# Patient Record
Sex: Male | Born: 1935 | Race: White | Hispanic: No | Smoking: Never smoker
Health system: Southern US, Community
[De-identification: ages and names within clinical notes are randomized; demographics above are authoritative.]

## PROBLEM LIST (undated history)

## (undated) DIAGNOSIS — Z8673 Personal history of transient ischemic attack (TIA), and cerebral infarction without residual deficits: Secondary | ICD-10-CM

## (undated) DIAGNOSIS — E785 Hyperlipidemia, unspecified: Secondary | ICD-10-CM

## (undated) DIAGNOSIS — E119 Type 2 diabetes mellitus without complications: Secondary | ICD-10-CM

## (undated) DIAGNOSIS — I10 Essential (primary) hypertension: Secondary | ICD-10-CM

## (undated) DIAGNOSIS — I669 Occlusion and stenosis of unspecified cerebral artery: Secondary | ICD-10-CM

## (undated) DIAGNOSIS — E7800 Pure hypercholesterolemia, unspecified: Secondary | ICD-10-CM

## (undated) DIAGNOSIS — N200 Calculus of kidney: Secondary | ICD-10-CM

## (undated) HISTORY — DX: Pure hypercholesterolemia, unspecified: E78.00

## (undated) HISTORY — DX: Occlusion and stenosis of unspecified cerebral artery: I66.9

## (undated) HISTORY — DX: Essential (primary) hypertension: I10

## (undated) HISTORY — PX: CHOLECYSTECTOMY: SHX55

## (undated) HISTORY — DX: Personal history of transient ischemic attack (TIA), and cerebral infarction without residual deficits: Z86.73

## (undated) HISTORY — DX: Hyperlipidemia, unspecified: E78.5

## (undated) HISTORY — DX: Type 2 diabetes mellitus without complications: E11.9

## (undated) HISTORY — PX: TONSILLECTOMY: SUR1361

## (undated) HISTORY — DX: Calculus of kidney: N20.0

---

## 2008-09-24 ENCOUNTER — Inpatient Hospital Stay: Payer: Self-pay | Admitting: Internal Medicine

## 2011-08-09 DIAGNOSIS — Z7901 Long term (current) use of anticoagulants: Secondary | ICD-10-CM | POA: Insufficient documentation

## 2011-08-09 DIAGNOSIS — I669 Occlusion and stenosis of unspecified cerebral artery: Secondary | ICD-10-CM

## 2011-08-09 HISTORY — DX: Occlusion and stenosis of unspecified cerebral artery: I66.9

## 2014-09-25 DIAGNOSIS — I10 Essential (primary) hypertension: Secondary | ICD-10-CM

## 2014-09-25 DIAGNOSIS — E7800 Pure hypercholesterolemia, unspecified: Secondary | ICD-10-CM

## 2014-09-25 DIAGNOSIS — E119 Type 2 diabetes mellitus without complications: Secondary | ICD-10-CM

## 2014-09-25 HISTORY — DX: Type 2 diabetes mellitus without complications: E11.9

## 2014-09-25 HISTORY — DX: Essential (primary) hypertension: I10

## 2014-09-25 HISTORY — DX: Pure hypercholesterolemia, unspecified: E78.00

## 2014-10-24 ENCOUNTER — Inpatient Hospital Stay: Payer: Self-pay | Admitting: Internal Medicine

## 2014-10-24 LAB — COMPREHENSIVE METABOLIC PANEL
ALBUMIN: 3.7 g/dL (ref 3.4–5.0)
ALK PHOS: 39 U/L — AB
ANION GAP: 8 (ref 7–16)
AST: 30 U/L (ref 15–37)
BUN: 22 mg/dL — ABNORMAL HIGH (ref 7–18)
Bilirubin,Total: 0.8 mg/dL (ref 0.2–1.0)
CHLORIDE: 109 mmol/L — AB (ref 98–107)
CREATININE: 0.85 mg/dL (ref 0.60–1.30)
Calcium, Total: 9.5 mg/dL (ref 8.5–10.1)
Co2: 24 mmol/L (ref 21–32)
EGFR (African American): >60
Glucose: 256 mg/dL — ABNORMAL HIGH (ref 65–99)
OSMOLALITY: 293 (ref 275–301)
Potassium: 4.4 mmol/L (ref 3.5–5.1)
SGPT (ALT): 37 U/L
Sodium: 141 mmol/L (ref 136–145)
TOTAL PROTEIN: 6.7 g/dL (ref 6.4–8.2)

## 2014-10-24 LAB — CBC
HCT: 43.6 % (ref 40.0–52.0)
HGB: 14.9 g/dL (ref 13.0–18.0)
MCH: 33.5 pg (ref 26.0–34.0)
MCHC: 34.2 g/dL (ref 32.0–36.0)
MCV: 98 fL (ref 80–100)
PLATELETS: 191 10*3/uL (ref 150–440)
RBC: 4.46 10*6/uL (ref 4.40–5.90)
RDW: 13.6 % (ref 11.5–14.5)
WBC: 13.2 10*3/uL — AB (ref 3.8–10.6)

## 2014-10-24 LAB — APTT: ACTIVATED PTT: 30.9 s (ref 23.6–35.9)

## 2014-10-24 LAB — LIPASE, BLOOD: Lipase: 222 U/L (ref 73–393)

## 2014-10-24 LAB — PROTIME-INR
INR: 2.7
PROTHROMBIN TIME: 28.1 s — AB (ref 11.5–14.7)

## 2014-10-25 LAB — CBC WITH DIFFERENTIAL/PLATELET
BASOS ABS: 0.1 10*3/uL (ref 0.0–0.1)
Basophil #: 0.1 10*3/uL (ref 0.0–0.1)
Basophil %: 0.8 %
Basophil %: 0.8 %
EOS ABS: 0 10*3/uL (ref 0.0–0.7)
EOS PCT: 0.7 %
Eosinophil #: 0.1 10*3/uL (ref 0.0–0.7)
Eosinophil %: 0.3 %
HCT: 32.9 % — AB (ref 40.0–52.0)
HCT: 35.9 % — ABNORMAL LOW (ref 40.0–52.0)
HGB: 11.4 g/dL — ABNORMAL LOW (ref 13.0–18.0)
HGB: 12.3 g/dL — ABNORMAL LOW (ref 13.0–18.0)
LYMPHS ABS: 1.1 10*3/uL (ref 1.0–3.6)
Lymphocyte #: 1 10*3/uL (ref 1.0–3.6)
Lymphocyte %: 10.4 %
Lymphocyte %: 8.6 %
MCH: 33.8 pg (ref 26.0–34.0)
MCH: 33.9 pg (ref 26.0–34.0)
MCHC: 34.4 g/dL (ref 32.0–36.0)
MCHC: 34.7 g/dL (ref 32.0–36.0)
MCV: 98 fL (ref 80–100)
MCV: 98 fL (ref 80–100)
Monocyte #: 0.8 x10 3/mm (ref 0.2–1.0)
Monocyte #: 0.9 x10 3/mm (ref 0.2–1.0)
Monocyte %: 7.4 %
Monocyte %: 7.6 %
Neutrophil #: 8.7 10*3/uL — ABNORMAL HIGH (ref 1.4–6.5)
Neutrophil #: 9.6 10*3/uL — ABNORMAL HIGH (ref 1.4–6.5)
Neutrophil %: 80.5 %
Neutrophil %: 82.9 %
Platelet: 166 10*3/uL (ref 150–440)
Platelet: 171 10*3/uL (ref 150–440)
RBC: 3.36 10*6/uL — AB (ref 4.40–5.90)
RBC: 3.66 10*6/uL — AB (ref 4.40–5.90)
RDW: 13.5 % (ref 11.5–14.5)
RDW: 13.6 % (ref 11.5–14.5)
WBC: 10.8 10*3/uL — AB (ref 3.8–10.6)
WBC: 11.6 10*3/uL — ABNORMAL HIGH (ref 3.8–10.6)

## 2014-10-25 LAB — MAGNESIUM: MAGNESIUM: 1.7 mg/dL — AB

## 2014-10-25 LAB — BASIC METABOLIC PANEL
Anion Gap: 7 (ref 7–16)
BUN: 25 mg/dL — ABNORMAL HIGH (ref 7–18)
CHLORIDE: 112 mmol/L — AB (ref 98–107)
CO2: 27 mmol/L (ref 21–32)
Calcium, Total: 8.1 mg/dL — ABNORMAL LOW (ref 8.5–10.1)
Creatinine: 0.79 mg/dL (ref 0.60–1.30)
EGFR (African American): >60
GLUCOSE: 195 mg/dL — AB (ref 65–99)
Osmolality: 300 (ref 275–301)
Potassium: 4.4 mmol/L (ref 3.5–5.1)
SODIUM: 146 mmol/L — AB (ref 136–145)

## 2014-10-25 LAB — HEMOGLOBIN A1C: HEMOGLOBIN A1C: 8.3 % — AB (ref 4.2–6.3)

## 2014-10-26 LAB — MAGNESIUM: Magnesium: 2.1 mg/dL

## 2014-10-26 LAB — PROTIME-INR
INR: 2.2
Prothrombin Time: 24 secs — ABNORMAL HIGH (ref 11.5–14.7)

## 2014-10-26 LAB — POTASSIUM: POTASSIUM: 3.8 mmol/L (ref 3.5–5.1)

## 2014-10-26 LAB — HEMOGLOBIN: HGB: 9.3 g/dL — AB (ref 13.0–18.0)

## 2014-10-27 LAB — PROTIME-INR
INR: 1.3
Prothrombin Time: 16.3 secs — ABNORMAL HIGH (ref 11.5–14.7)

## 2014-10-27 LAB — HEMOGLOBIN: HGB: 9 g/dL — AB (ref 13.0–18.0)

## 2014-10-28 LAB — CBC WITH DIFFERENTIAL/PLATELET
BASOS PCT: 1 %
Basophil #: 0.1 10*3/uL (ref 0.0–0.1)
Eosinophil #: 0.3 10*3/uL (ref 0.0–0.7)
Eosinophil %: 4.6 %
HCT: 26.9 % — ABNORMAL LOW (ref 40.0–52.0)
HGB: 9.1 g/dL — AB (ref 13.0–18.0)
LYMPHS PCT: 16.3 %
Lymphocyte #: 1.1 10*3/uL (ref 1.0–3.6)
MCH: 33.6 pg (ref 26.0–34.0)
MCHC: 33.6 g/dL (ref 32.0–36.0)
MCV: 100 fL (ref 80–100)
MONOS PCT: 10.1 %
Monocyte #: 0.7 x10 3/mm (ref 0.2–1.0)
NEUTROS ABS: 4.6 10*3/uL (ref 1.4–6.5)
NEUTROS PCT: 68 %
Platelet: 169 10*3/uL (ref 150–440)
RBC: 2.69 10*6/uL — AB (ref 4.40–5.90)
RDW: 14 % (ref 11.5–14.5)
WBC: 6.7 10*3/uL (ref 3.8–10.6)

## 2014-10-28 LAB — BASIC METABOLIC PANEL
Anion Gap: 4 — ABNORMAL LOW (ref 7–16)
BUN: 15 mg/dL (ref 7–18)
CHLORIDE: 110 mmol/L — AB (ref 98–107)
CO2: 30 mmol/L (ref 21–32)
Calcium, Total: 8.6 mg/dL (ref 8.5–10.1)
Creatinine: 0.83 mg/dL (ref 0.60–1.30)
EGFR (Non-African Amer.): >60
Glucose: 160 mg/dL — ABNORMAL HIGH (ref 65–99)
Osmolality: 291 (ref 275–301)
POTASSIUM: 4 mmol/L (ref 3.5–5.1)
Sodium: 144 mmol/L (ref 136–145)

## 2014-11-11 ENCOUNTER — Ambulatory Visit: Payer: Self-pay | Admitting: Internal Medicine

## 2015-04-04 NOTE — Consult Note (Signed)
Chief Complaint:  Subjective/Chief Complaint The patient has continued to have rectal bleeding and his Hb has gone down. The patient reports that he has spoken to his PCP about the need for a colonoscopy.   VITAL SIGNS/ANCILLARY NOTES: **Vital Signs.:   15-Nov-15 07:45  Vital Signs Type Q 8hr  Temperature Temperature (F) 98.6  Celsius 37  Temperature Source oral  Pulse Pulse 87  Respirations Respirations 17  Systolic BP Systolic BP 128  Diastolic BP (mmHg) Diastolic BP (mmHg) 70  Mean BP 89  Pulse Ox % Pulse Ox % 95  Pulse Ox Activity Level  At rest  Oxygen Delivery Room Air/ 21 %   Brief Assessment:  GEN well developed, well nourished, no acute distress   Respiratory normal resp effort  no use of accessory muscles   Additional Physical Exam Alert and orientated times 3   Lab Results: Routine Chem:  15-Nov-15 04:51   Magnesium, Serum 2.1 (1.8-2.4 THERAPEUTIC RANGE: 4-7 mg/dL TOXIC: > 10 mg/dL  -----------------------)  Potassium, Serum 3.8 (Result(s) reported on 26 Oct 2014 at 05:56AM.)  Routine Coag:  15-Nov-15 04:51   Prothrombin  24.0  INR 2.2 (INR reference interval applies to patients on anticoagulant therapy. A single INR therapeutic range for coumarins is not optimal for all indications; however, the suggested range for most indications is 2.0 - 3.0. Exceptions to the INR Reference Range may include: Prosthetic heart valves, acute myocardial infarction, prevention of myocardial infarction, and combinations of aspirin and anticoagulant. The need for a higher or lower target INR must be assessed individually. Reference: The Pharmacology and Management of the Vitamin K  antagonists: the seventh ACCP Conference on Antithrombotic and Thrombolytic Therapy. Chest.2004 Sept:126 (3suppl): [id number]. A HCT value >55% may artifactually increase the PT.  In one study,  the increase was an average of 25%. Reference:  "Effect on Routine and Special Coagulation Testing  Values of Citrate Anticoagulant Adjustment in Patients with High HCT Values." American Journal of Clinical Pathology 2006;126:400-405.)  Routine Hem:  15-Nov-15 04:51   Hemoglobin (CBC)  9.3 (Result(s) reported on 26 Oct 2014 at 05:44AM.)   Assessment/Plan:  Assessment/Plan:  Assessment Lower GI bleed.   Plan The patient has agreed to having a colonoscopy. He will be set up for the procedure for tomorrow.   Electronic Signatures: Midge MiniumWohl, Topaz Raglin (MD)  (Signed [phone]-Nov-15 10:14)  Authored: Chief Complaint, VITAL SIGNS/ANCILLARY NOTES, Brief Assessment, Lab Results, Assessment/Plan   Last Updated: 15-Nov-15 10:14 by Midge MiniumWohl, Sevyn Markham (MD)

## 2015-04-04 NOTE — Discharge Summary (Signed)
PATIENT NAME:  Gabriel MerinoSYKES, Gabriel [name] MR#:  [id number] DATE OF BIRTH:  [birth year]  DATE OF ADMISSION:  10/24/2014 DATE OF DISCHARGE:  10/28/2014  DISCHARGE DIAGNOSES: 1.  Acute hemorrhagic anemia/lower gastrointestinal bleed, likely due to large colonic polyp/internal hemorrhoid and acquired coagulopathy. 2.  Acquired coagulopathy, holding Coumadin until follow-up with primary care physician to decide long term Coumadin need.  3.  Hypomagnesemia, repleted and resolved.   SECONDARY DIAGNOSES: 1.  Hypertension.  2.  Diabetes.  3.  History of stroke.   CONSULTATIONS: Gastroenterology - Dr. Midge Miniumarren Wohl.   PROCEDURES AND RADIOLOGY: Colonoscopy by Dr. Servando SnareWohl on 16th of November showed one 15 mm large polyp in the sigmoid colon which was resected and retrieved. Diverticulosis in the sigmoid colon and in the descending colon. Nonbleeding internal hemorrhoids.   Abdominal three-way with PA chest on 13th of November showed multiple prostatic calculi. No lung edema or consolidation.   HISTORY AND SHORT HOSPITAL COURSE: The patient is an 81 year old male with above-mentioned medical problems who was admitted for rectal bleeding. Please see Dr. Nicky Pughhen's dictated history and physical for further details. The patient was found to have acute hemorrhagic anemia/lower GI bleed, thought to be diverticular in nature for which gastroenterology consultation was obtained with Dr. Midge Miniumarren Wohl who recommended colonoscopy, which was performed on the 16th of November with findings as above. The patient was hemodynamically stable post procedure, tolerated diet well and did not have any further bleeding and was discharged home on 17th of November in stable condition.   PERTINENT DISCHARGE PHYSICAL EXAMINATION: VITAL SIGNS: On the date of discharge, his vital signs were as follows: Temperature 98.3, heart rate 83 per minute, respirations 18 per minute, blood pressure 155/68. He was saturating 96% on room air.  CARDIOVASCULAR: S1, S2  normal. No murmurs, rubs, gallops.  LUNGS: Clear to auscultation bilaterally. No wheezing, rales, rhonchi, crepitation.  ABDOMEN: Soft, benign.  NEUROLOGIC: Nonfocal examination. All other physical examination remained at baseline.   DISCHARGE MEDICATIONS: 1.  Camphor/menthol topical to effected area 3 times a day as needed.  2.  Glipizide XL 5 mg [name].o. daily.  3.  Lisinopril 5 mg [name].o. at bedtime.  4.  Metformin 1000 mg [name].o. b.i.d.  5.  Metoprolol 25 mg [name].o. b.i.d.  6.  Vitamin C 500 mg [name].o. daily.  7.  Vitamin E 400 international units once daily.  8.  Aspirin 81 mg [name].o. daily.  9.  Atorvastatin 40 mg [name].o. at bedtime.  10.  Lisinopril 5 mg 3 tablets [name].o. in the morning, 5 mg 1 tablet [name].o. in the evening.  11.  Senna 1 tablet [name].o. b.i.d. for 7 days as needed.   DISCHARGE DIET: Low sodium, low fat, low cholesterol, 1800 ADA.   DISCHARGE ACTIVITY: As tolerated.   DISCHARGE INSTRUCTIONS AND FOLLOW-UP: The patient was instructed to stop warfarin until follow-up by his primary care physician to decide long term need for Coumadin.  He will need follow-up with his primary care physician, Dr. Rolin BarryMario Olmedo, in 1 to 2 weeks and with Dr. Midge Miniumarren Wohl from GI in 2 to 4 weeks.   TOTAL TIME DISCHARGING THIS PATIENT: 45 minutes.  ____________________________ Ellamae SiaVipul S. Sherryll BurgerShah, MD vss:sb D: 10/30/2014 09:46:34 ET T: 10/30/2014 12:27:14 ET JOB#: [id number]  cc: Zyairah Wacha S. Sherryll BurgerShah, MD, <Dictator> Dione HousekeeperMario Ernesto Olmedo, MD Midge Miniumarren Wohl, MD Ellamae SiaVIPUL S [facility] MD ELECTRONICALLY SIGNED 11/05/2014 15:06

## 2015-04-04 NOTE — Consult Note (Signed)
Brief Consult Note: Diagnosis: Patient admitted with a lower GI bleed. The patient reports that he had an episode of bleeding at home and then came tot he hospital. He had slipped on the floor in his bathroom.   Patient was seen by consultant.   Consult note dictated.   Comments: The patient reports having BRBPR awithout any abd pain. His Hb has gone down a bit but may be due to hydration. He has had bowel movements since admission without blood. He has never had a colonoscopy and has been told to have one as an outpatient if he bleeding has stopped.  Electronic Signatures: Midge MiniumWohl, Anthony Roland (MD)  (Signed [phone]-Nov-15 12:29)  Authored: Brief Consult Note   Last Updated: 14-Nov-15 12:29 by Midge MiniumWohl, Steel Kerney (MD)

## 2015-04-04 NOTE — Consult Note (Signed)
PATIENT NAME:  [name], [name] MR#: Valarie Merino [id number] DATE OF BIRTH:  [birth year]  DATE OF CONSULTATION:  10/25/2014  CONSULTING PHYSICIAN:  Midge Miniumarren Layza Summa, MD  CONSULTING SERVICE: Gastroenterology.   REASON FOR CONSULTATION: Lower GI bleed.   HISTORY OF PRESENT ILLNESS: This patient is an 81 year old gentleman who comes in today with a history of never having a colonoscopy in the past. He has been having some rectal bleeding. The patient reports that he had 3 episodes of rectal bleeding prior to coming to the hospital, but then had a normal bowel movement after that. The patient has a history of hypertension and diabetes. He also has had a CVA in the past and is on anticoagulation. The patient reports that he has never had rectal bleeding in the past. He also reports that he was wearing slippery shoes and fell in his bathroom, but it was not related to any of his rectal bleeding. He states that the rectal bleeding was approximately 3 cups in volume, but there were no clots and it was all fresh blood. There is no reported abdominal pain, unexplained weight loss or change in bowel habits. The patient does report that he is constipated sometimes.   PAST MEDICAL HISTORY: Hypertension, diabetes, CVA.   PAST SURGICAL HISTORY: Cholecystectomy, tonsillectomy.   SOCIAL HISTORY: Quit smoking years ago. No drugs or alcohol abuse.   FAMILY HISTORY: Noncontributory.   ALLERGIES: DARVON.   MEDICATIONS: Coumadin, vitamin E, Lopressor, metformin, lisinopril, glipizide, atorvastatin and an aspirin.   REVIEW OF SYSTEMS: A 10 point review of systems is negative, except for what was stated above.   PHYSICAL EXAMINATION: GENERAL: The patient is sitting up in bed in no apparent distress.   VITAL SIGNS: Temperature 98.4, pulse 109, respirations 18, blood pressure of 109/68, pulse oximetry 96% on room air.   HEENT: Normocephalic, atraumatic. Extraocular motor intact. Pupils equally round and reactive to light and  accommodation.  NECK: Without JVD, without lymphadenopathy.   LUNGS: Clear to auscultation bilaterally.   HEART: Regular rate and rhythm without murmurs, rubs or gallops.   ABDOMEN: Soft, nontender, nondistended, without hepatosplenomegaly.   EXTREMITIES: Without cyanosis, clubbing, or edema.   SKIN: Recent surgery on the left check.  NEUROLOGICAL: Grossly intact.   LABORATORY DATA: Hemoglobin on admission 14.9. Hemoglobin repeated this morning was 11.4. The patient's INR was 2.7.   ASSESSMENT AND PLAN: This patient is an 81 year old gentleman who has never had a colonoscopy in the past. The patient had normal bowel movement since being admitted to the hospital. He had a bowel movement right around the time of I went to visit him, which showed him passing clots of blood. The patient was told that it would be advisable for him to have a colonoscopy. The patient reports that he had been told by his primary care provider not to have any procedures or surgery unless he spoke to him first. The patient is refusing any intervention at this time until he speaks to his primary care provider. The patient is under the impression that his primary care provider will come into the hospital and talk to him. The patient will be seen tomorrow for further information if he has discussed the procedure with his primary care provider. If the patient does consent to the colonoscopy, then we will plan that for Monday. If he does not, then consider a barium enema versus discharging the patient to home.   Thank you very much for involving me in the care of this patient. If  you have any questions, please do not hesitate to call.      ____________________________ Midge Minium, MD dw:TT D: 10/25/2014 16:14:45 ET T: 10/25/2014 16:27:09 ET JOB#: [id number]  cc: Midge Minium, MD, <Dictator> Midge Minium MD ELECTRONICALLY SIGNED 10/26/2014 8:05

## 2015-04-04 NOTE — H&P (Signed)
PATIENT NAME:  Gabriel MerinoSYKES, Quenton P MR#:  [id number] DATE OF BIRTH:  1932-02-20  DATE OF ADMISSION:  10/24/2014  PRIMARY CARE PHYSICIAN: Dr. Zada Finderslmedo.  REFERRING PHYSICIAN: Dr. Inocencio HomesGayle.   CHIEF COMPLAINT: Rectal bleeding today.   HISTORY OF PRESENT ILLNESS: An 81 year old Caucasian male with a history of hypertension, diabetes, CVA presented to the ED with above chief complaint. The patient is alert, awake, oriented, in no acute distress. The patient said he has a history of CVA and has been on Coumadin but no previous bleeding. The patient developed rectal bleeding since yesterday, which is fresh blood. He estimates total about more than 3 cups, but the patient denies any headache, dizziness. No weakness, chest pain, or palpitations. The patient denies any other symptoms.   PAST MEDICAL HISTORY: Hypertension, diabetes, CVA.   PAST SURGICAL HISTORY: Cholecystectomy, tonsillectomy, Mohs surgery on the left side of his face 2 days ago.   SOCIAL HISTORY: Quit smoking years ago. Denies any alcohol drinking or illicit drugs.   FAMILY HISTORY: Father died of emphysema. Mother had Alzheimer disease.   ALLERGIES:  DARVON.   HOME MEDICATIONS: Coumadin 1 tablet once a day at bedtime except Monday and Wednesday, Coumadin 0.5 tablets once a day on Monday Wednesday; vitamin E 400 international units one cap once a day; vitamin C 500 mg p.o. daily; Lopressor 25 mg p.o. b.i.d.; metformin 1000 mg p.o. b.i.d.; lisinopril 5 mg p.o. 3 tablets once a day in the morning, 5 mg 1 tablet once a day in the evening, 5 mg once a day at bedtime; glipizide XL 5 mg p.o. daily; atorvastatin 40 mg p.o. at bedtime; aspirin 81 mg p.o. daily.   REVIEW OF SYSTEMS:   CONSTITUTIONAL: The patient denies any fever or chills. No headache or dizziness or weakness.  EYES: No double vision, blurry vision.  EAR, NOSE, THROAT: No postnasal drip, slurred speech, or dysphagia.  CARDIOVASCULAR: No chest pain, palpitation, orthopnea, or nocturnal  dyspnea. No leg edema.  PULMONARY: No cough, sputum, shortness of breath, or hematemesis.  GASTROINTESTINAL: No abdominal pain, nausea, vomiting, or diarrhea. No melena but has bloody stool.  GENITOURINARY: No dysuria, hematuria, or incontinence.  SKIN: No rash or jaundice.  NEUROLOGY: No syncope, loss of consciousness, or seizure.  ENDOCRINOLOGY: No polyuria, polydipsia, or heat or cold intolerance.  HEMATOLOGY: No easy bruising or bleeding but has rectal bleeding.   PHYSICAL EXAMINATION:  VITAL SIGNS: Temperature 97.4, blood pressure 144/80, oxygen saturation 100% on room air.  GENERAL: The patient is alert, awake, oriented, in no acute distress.  HEENT: Pupils round, equal, and reactive to light and accommodation. Moist oral mucosa. Clear oropharynx.  NECK: Supple. No JVD or carotid bruit. No lymphadenopathy. No thyromegaly.  CARDIOVASCULAR: S1, S2, regular rate, rhythm. No murmurs or gallops.  PULMONARY: Bilateral air entry. No wheezing or rales. No use of accessory muscle to breathe.  ABDOMEN: Soft. No distention or tenderness. No organomegaly. Bowel sounds present.  EXTREMITIES: No edema, clubbing, or cyanosis. No calf tenderness. Bilateral pedal pulses are present.  SKIN: No rash or jaundice.  NEUROLOGY: A and O x 3. No focal deficit. Power 5/5. Sensation intact.   LABORATORY DATA: Abdominal 3-way including PA chest showed bowel gas pattern unremarkable, no long edema or consolidation. Lipase 222, WBC 13.2, hemoglobin 14.9, platelets 191, glucose of 256, BUN 22, creatinine 0.85. Electrolytes are normal. INR 2.7.   IMPRESSIONS:  1. Gastrointestinal bleeding.  2. Leukocytosis.  3. Coagulopathy.  4. Hypertension.  5. Diabetes. 6. History of cerebrovascular  accident   PLAN OF TREATMENT: Patient will be admitted to medical floor. We will hold Coumadin and aspirin. Check hemoglobin q. 6 hours. start Protonix IV b.i.d. We will get a GI consult for possible endoscopy. For hypertension  continue lisinopril and Lopressor. For diabetes, we will start a sliding scale and hold glipizide. For CVA we will hold aspirin and Coumadin but continue atorvastatin.   I discussed the patient's condition and plan of treatment with the patient and the patient's brother. Patient wants full code.   TIME SPENT: About 55 minutes.    ____________________________ Shaune Pollack, MD qc:bm D: 10/24/2014 21:10:12 ET T: 10/24/2014 22:30:45 ET JOB#: [id number]  cc: Shaune Pollack, MD, <Dictator> Shaune Pollack MD ELECTRONICALLY SIGNED 10/28/2014 1:31

## 2015-04-04 NOTE — Consult Note (Signed)
Chief Complaint:  Subjective/Chief Complaint No further rectal bleeding.  Hgb stable.  Denies abdominal pain.   VITAL SIGNS/ANCILLARY NOTES: **Vital Signs.:   17-Nov-15 07:45  Vital Signs Type Q 8hr  Temperature Temperature (F) 98.3  Celsius 36.8  Temperature Source oral  Pulse Pulse 83  Respirations Respirations 18  Systolic BP Systolic BP 161  Diastolic BP (mmHg) Diastolic BP (mmHg) 68  Mean BP 97  Pulse Ox % Pulse Ox % 96  Pulse Ox Activity Level  At rest  Oxygen Delivery Room Air/ 21 %   Brief Assessment:  GEN well developed, well nourished, no acute distress, A/Ox3   Cardiac Regular   Respiratory normal resp effort  no use of accessory muscles   Gastrointestinal details normal Soft  Nontender  Nondistended  Bowel sounds normal   EXTR negative cyanosis/clubbing, negative edema   Additional Physical Exam Skin: pink, warm , dry except large healing dime-sized wound left cheek from recent biopsy   Lab Results:  Routine Chem:  17-Nov-15 05:23   Glucose, Serum  160  BUN 15  Creatinine (comp) 0.83  Sodium, Serum 144  Potassium, Serum 4.0  Chloride, Serum  110  CO2, Serum 30  Calcium (Total), Serum 8.6  Anion Gap  4  Osmolality (calc) 291  eGFR (African American) >60  eGFR (Non-African American) >60 (eGFR values <41m/min/1.73 m2 may be an indication of chronic kidney disease (CKD). Calculated eGFR, using the MRDR Study equation, is useful in  patients with stable renal function. The eGFR calculation will not be reliable in acutely ill patients when serum creatinine is changing rapidly. It is not useful in patients on dialysis. The eGFR calculation may not be applicable to patients at the low and high extremes of body sizes, pregnant women, and vegetarians.)  Routine Hem:  17-Nov-15 05:23   WBC (CBC) 6.7  RBC (CBC)  2.69  Hemoglobin (CBC)  9.1  Hematocrit (CBC)  26.9  Platelet Count (CBC) 169  MCV 100  MCH 33.6  MCHC 33.6  RDW 14.0  Neutrophil % 68.0   Lymphocyte % 16.3  Monocyte % 10.1  Eosinophil % 4.6  Basophil % 1.0  Neutrophil # 4.6  Lymphocyte # 1.1  Monocyte # 0.7  Eosinophil # 0.3  Basophil # 0.1 (Result(s) reported on 28 Oct 2014 at 06:14AM.)   Assessment/Plan:  Assessment/Plan:  Assessment Lower GI Bleed: Secondary to large colonic polyp s/p resection.  Diverticulosis: Stable I have [facility] our plan of care is below.   Plan 1) We will FU pathology with patient 2) Pt to follow up with PCP regarding coumadin 3) Pt to call if any further GI bleeding Will sign off, Please call if you have any questions or concerns   Electronic Signatures: JAndria Meuse(NP)  (Signed 17-Nov-15 10:00)  Authored: Chief Complaint, VITAL SIGNS/ANCILLARY NOTES, Brief Assessment, Lab Results, Assessment/Plan   Last Updated: 17-Nov-15 10:00 by JAndria Meuse(NP)

## 2015-04-06 LAB — SURGICAL PATHOLOGY

## 2015-05-07 ENCOUNTER — Encounter: Payer: Self-pay | Admitting: Emergency Medicine

## 2015-05-07 ENCOUNTER — Emergency Department: Payer: Medicare PPO

## 2015-05-07 ENCOUNTER — Emergency Department
Admission: EM | Admit: 2015-05-07 | Discharge: 2015-05-07 | Disposition: A | Discharge status: Home / Self Care | Payer: Medicare PPO | Attending: Emergency Medicine | Admitting: Emergency Medicine

## 2015-05-07 DIAGNOSIS — N23 Unspecified renal colic: Secondary | ICD-10-CM | POA: Diagnosis not present

## 2015-05-07 DIAGNOSIS — Z7982 Long term (current) use of aspirin: Secondary | ICD-10-CM | POA: Diagnosis not present

## 2015-05-07 DIAGNOSIS — R109 Unspecified abdominal pain: Secondary | ICD-10-CM | POA: Diagnosis present

## 2015-05-07 DIAGNOSIS — R079 Chest pain, unspecified: Secondary | ICD-10-CM | POA: Insufficient documentation

## 2015-05-07 DIAGNOSIS — I10 Essential (primary) hypertension: Secondary | ICD-10-CM | POA: Diagnosis not present

## 2015-05-07 DIAGNOSIS — E119 Type 2 diabetes mellitus without complications: Secondary | ICD-10-CM | POA: Insufficient documentation

## 2015-05-07 DIAGNOSIS — Z79899 Other long term (current) drug therapy: Secondary | ICD-10-CM | POA: Diagnosis not present

## 2015-05-07 HISTORY — DX: Essential (primary) hypertension: I10

## 2015-05-07 HISTORY — DX: Type 2 diabetes mellitus without complications: E11.9

## 2015-05-07 LAB — COMPREHENSIVE METABOLIC PANEL
ALT: 31 U/L (ref 17–63)
AST: 37 U/L (ref 15–41)
Albumin: 4.5 g/dL (ref 3.5–5.0)
Alkaline Phosphatase: 48 U/L (ref 38–126)
Anion gap: 12 (ref 5–15)
BUN: 25 mg/dL — ABNORMAL HIGH (ref 6–20)
CO2: 25 mmol/L (ref 22–32)
Calcium: 10.4 mg/dL — ABNORMAL HIGH (ref 8.9–10.3)
Chloride: 100 mmol/L — ABNORMAL LOW (ref 101–111)
Creatinine, Ser: 1.11 mg/dL (ref 0.61–1.24)
GFR calc Af Amer: >60 mL/min (ref >60)
GLUCOSE: 268 mg/dL — AB (ref 65–99)
Potassium: 3.9 mmol/L (ref 3.5–5.1)
Sodium: 137 mmol/L (ref 135–145)
Total Bilirubin: 1.3 mg/dL — ABNORMAL HIGH (ref 0.3–1.2)
Total Protein: 7.7 g/dL (ref 6.5–8.1)

## 2015-05-07 LAB — CBC WITH DIFFERENTIAL/PLATELET
Basophils Absolute: 0.1 10*3/uL (ref 0–0.1)
Basophils Relative: 1 %
EOS ABS: 0.1 10*3/uL (ref 0–0.7)
Eosinophils Relative: 1 %
HCT: 50 % (ref 40.0–52.0)
HEMOGLOBIN: 17 g/dL (ref 13.0–18.0)
LYMPHS ABS: 0.9 10*3/uL — AB (ref 1.0–3.6)
LYMPHS PCT: 7 %
MCH: 32.6 pg (ref 26.0–34.0)
MCHC: 34.1 g/dL (ref 32.0–36.0)
MCV: 95.6 fL (ref 80.0–100.0)
MONO ABS: 1.2 10*3/uL — AB (ref 0.2–1.0)
Monocytes Relative: 9 %
NEUTROS PCT: 82 %
Neutro Abs: 10.7 10*3/uL — ABNORMAL HIGH (ref 1.4–6.5)
PLATELETS: 177 10*3/uL (ref 150–440)
RBC: 5.23 MIL/uL (ref 4.40–5.90)
RDW: 14.8 % — ABNORMAL HIGH (ref 11.5–14.5)
WBC: 13 10*3/uL — ABNORMAL HIGH (ref 3.8–10.6)

## 2015-05-07 LAB — URINALYSIS COMPLETE WITH MICROSCOPIC (ARMC ONLY)
BACTERIA UA: NONE SEEN
Bilirubin Urine: NEGATIVE
Glucose, UA: >500 mg/dL — AB
Leukocytes, UA: NEGATIVE
Nitrite: NEGATIVE
Protein, ur: 30 mg/dL — AB
Specific Gravity, Urine: 1.011 (ref 1.005–1.030)
Squamous Epithelial / LPF: NONE SEEN
pH: 8 (ref 5.0–8.0)

## 2015-05-07 LAB — PROTIME-INR
INR: 2.28
Prothrombin Time: 25.3 seconds — ABNORMAL HIGH (ref 11.4–15.0)

## 2015-05-07 LAB — TROPONIN I: Troponin I: <0.03 ng/mL (ref <0.031)

## 2015-05-07 LAB — LIPASE, BLOOD: Lipase: 41 U/L (ref 22–51)

## 2015-05-07 MED ORDER — TAMSULOSIN HCL 0.4 MG PO CAPS: 0.4000 mg | ORAL_CAPSULE | Freq: Every day | ORAL | Status: DC

## 2015-05-07 MED ORDER — IOHEXOL 240 MG/ML SOLN
25.0000 mL | Freq: Once | INTRAMUSCULAR | Status: AC | PRN
  Administered 2015-05-07: 25 mL via ORAL

## 2015-05-07 MED ORDER — OXYCODONE-ACETAMINOPHEN 5-325 MG PO TABS: 1.0000 | ORAL_TABLET | Freq: Four times a day (QID) | ORAL | Status: DC | PRN

## 2015-05-07 MED ORDER — HYDROMORPHONE HCL 1 MG/ML IJ SOLN
INTRAMUSCULAR | Status: AC
  Filled 2015-05-07: qty 1

## 2015-05-07 MED ORDER — KETOROLAC TROMETHAMINE 30 MG/ML IJ SOLN
INTRAMUSCULAR | Status: AC
  Filled 2015-05-07: qty 1

## 2015-05-07 MED ORDER — SODIUM CHLORIDE 0.9 % IV BOLUS (SEPSIS)
1000.0000 mL | Freq: Once | INTRAVENOUS | Status: AC
  Administered 2015-05-07: 1000 mL via INTRAVENOUS

## 2015-05-07 MED ORDER — ONDANSETRON HCL 4 MG/2ML IJ SOLN
4.0000 mg | Freq: Once | INTRAMUSCULAR | Status: AC
  Administered 2015-05-07: 4 mg via INTRAVENOUS

## 2015-05-07 MED ORDER — ONDANSETRON HCL 4 MG/2ML IJ SOLN
INTRAMUSCULAR | Status: AC
  Filled 2015-05-07: qty 2

## 2015-05-07 MED ORDER — LISINOPRIL 5 MG PO TABS: 5.0000 mg | ORAL_TABLET | Freq: Once | ORAL | Status: DC

## 2015-05-07 MED ORDER — LISINOPRIL 5 MG PO TABS
15.0000 mg | ORAL_TABLET | Freq: Once | ORAL | Status: AC
  Administered 2015-05-07: 15 mg via ORAL
  Filled 2015-05-07: qty 1

## 2015-05-07 MED ORDER — IOHEXOL 350 MG/ML SOLN
125.0000 mL | Freq: Once | INTRAVENOUS | Status: AC | PRN
  Administered 2015-05-07: 125 mL via INTRAVENOUS

## 2015-05-07 MED ORDER — KETOROLAC TROMETHAMINE 30 MG/ML IJ SOLN
30.0000 mg | Freq: Once | INTRAMUSCULAR | Status: AC
  Administered 2015-05-07: 30 mg via INTRAVENOUS

## 2015-05-07 MED ORDER — HYDROMORPHONE HCL 1 MG/ML IJ SOLN
1.0000 mg | Freq: Once | INTRAMUSCULAR | Status: AC
  Administered 2015-05-07: 09:00:00 via INTRAVENOUS

## 2015-05-07 NOTE — ED Provider Notes (Signed)
CSN: [id number]     Arrival date & time 05/07/15  0734 History   First MD Initiated Contact with Patient 05/07/15 0800     Chief Complaint  Patient presents with  . Abdominal Pain     (Consider location/radiation/quality/duration/timing/severity/associated sxs/prior Treatment) The history is provided by the patient.  Gregor Hamshomas P Rowe Jr. is a 81 y.o. male hx of HTN, DM or presenting with abdominal pain and vomiting. Left-sided abdominal pain for the last week or so. Started after he ate many cashews that evening. And improved and last night came back and was persistent. Left-sided radiate to his back. Also some nonbilious and nonbloody vomiting. Last bowel movement was yesterday but was unable to have a bowel movement this morning. Denies any fevers or chills. History of cholecystectomy never had history of SBO. Denies chest pain or shortness of breath.    Past Medical History  Diagnosis Date  . Hypertension   . Diabetes mellitus without complication     Metformin   Past Surgical History  Procedure Laterality Date  . Cholecystectomy     History reviewed. No pertinent family history. History  Substance Use Topics  . Smoking status: Never Smoker   . Smokeless tobacco: Not on file  . Alcohol Use: No    Review of Systems  Gastrointestinal: Positive for vomiting and abdominal pain.  All other systems reviewed and are negative.     Allergies  Darvon  Home Medications   Prior to Admission medications   Medication Sig Start Date End Date Taking? Authorizing Provider  aspirin EC 81 MG tablet Take 81 mg by mouth daily.   Yes Historical Provider, MD  atorvastatin (LIPITOR) 40 MG tablet Take 40 mg by mouth at bedtime.   Yes Historical Provider, MD  glipiZIDE (GLUCOTROL XL) 5 MG 24 hr tablet Take 5 mg by mouth daily.   Yes Historical Provider, MD  lisinopril (PRINIVIL,ZESTRIL) 5 MG tablet Take 15 mg by mouth every morning.   Yes Historical Provider, MD  lisinopril  (PRINIVIL,ZESTRIL) 5 MG tablet Take 5 mg by mouth 2 (two) times daily. In the evening at 6 pm and at bedtime   Yes Historical Provider, MD  metFORMIN (GLUCOPHAGE) 1000 MG tablet Take 1,000 mg by mouth 2 (two) times daily.   Yes Historical Provider, MD  metoprolol succinate (TOPROL-XL) 25 MG 24 hr tablet Take 25 mg by mouth 2 (two) times daily.   Yes Historical Provider, MD  vitamin C (ASCORBIC ACID) 500 MG tablet Take 500 mg by mouth daily.   Yes Historical Provider, MD  vitamin E 400 UNIT capsule Take 400 Units by mouth daily.   Yes Historical Provider, MD   BP 168/94 mmHg  Pulse 104  Temp(Src) 98.7 F (37.1 C) (Oral)  Resp 18  Ht 6\' 1"  (1.854 m)  Wt 222 lb (100.699 kg)  BMI 29.30 kg/m2  SpO2 91% Physical Exam  Constitutional:  Uncomfortable, vomiting   HENT:  Head: Normocephalic.  MM dry   Eyes: Conjunctivae are normal. Pupils are equal, round, and reactive to light.  Neck: Normal range of motion. Neck supple.  Cardiovascular: Normal rate, regular rhythm and normal heart sounds.   Pulmonary/Chest: Effort normal and breath sounds normal. No respiratory distress. He has no wheezes. He has no rales.  Abdominal: Soft. Bowel sounds are normal.  Distended. Mild diffuse tenderness, worse in LLQ   Musculoskeletal: Normal range of motion. He exhibits no edema or tenderness.  Neurological: He is alert. No cranial nerve deficit. Coordination  normal.  Skin: Skin is warm and dry.  Psychiatric: He has a normal mood and affect. His behavior is normal. Judgment and thought content normal.  Nursing note and vitals reviewed.   ED Course  Procedures (including critical care time) Labs Review Labs Reviewed  CBC WITH DIFFERENTIAL/PLATELET - Abnormal; Notable for the following:    WBC 13.0 (*)    RDW 14.8 (*)    Neutro Abs 10.7 (*)    Lymphs Abs 0.9 (*)    Monocytes Absolute 1.2 (*)    All other components within normal limits  COMPREHENSIVE METABOLIC PANEL - Abnormal; Notable for the  following:    Chloride 100 (*)    Glucose, Bld 268 (*)    BUN 25 (*)    Calcium 10.4 (*)    Total Bilirubin 1.3 (*)    All other components within normal limits  URINALYSIS COMPLETEWITH MICROSCOPIC ([facility] ONLY) - Abnormal; Notable for the following:    Color, Urine STRAW (*)    APPearance CLEAR (*)    Glucose, UA >500 (*)    Ketones, ur 1+ (*)    Hgb urine dipstick 1+ (*)    Protein, ur 30 (*)    All other components within normal limits  PROTIME-INR - Abnormal; Notable for the following:    Prothrombin Time 25.3 (*)    All other components within normal limits  TROPONIN I  LIPASE, BLOOD    Imaging Review Ct Angio Chest Aorta W/cm &/or Wo/cm  05/07/2015   CLINICAL DATA:  Left lower quadrant pain for 1 week  EXAM: CT ANGIOGRAPHY CHEST, ABDOMEN AND PELVIS  TECHNIQUE: Multidetector CT imaging through the chest, abdomen and pelvis was performed using the standard protocol during bolus administration of intravenous contrast. Multiplanar reconstructed images and MIPs were obtained and reviewed to evaluate the vascular anatomy.  CONTRAST:  OMNIPAQUE IOHEXOL 350 MG/ML SOLN  COMPARISON:  None.  FINDINGS: CTA CHEST FINDINGS  The lungs are well aerated bilaterally without focal infiltrate or sizable effusion. Very minimal left basilar atelectasis is seen. No pneumothorax is noted. Some calcified pleural plaques are noted posteriorly on the left.  The thoracic inlet is within normal limits. The thoracic aorta shows mild calcifications without aneurysmal dilatation. Coronary calcifications are noted particularly in the left anterior descending coronary artery. The origins of the brachiocephalic vessels are within normal limits. Pulmonary artery is visualized although not timed for pulmonary embolism evaluation shows no gross abnormality. No hilar or mediastinal adenopathy is seen.  The bony structures of the thorax show degenerative change of the thoracic spine.  Review of the MIP images confirms  the above findings.  CTA ABDOMEN AND PELVIS FINDINGS  The liver is diffusely fatty infiltrated. The gallbladder has been surgically removed. The spleen, adrenal glands and pancreas are within normal limits.  The right kidney demonstrates a normal enhancement pattern with the exception of multiple cystic lesions. No renal calculi or obstructive changes are seen. The right ureter is within normal limits. On the left, the kidney demonstrates a normal enhancement pattern although slightly delayed when compared with the right. Some cystic changes are noted in the upper pole. Moderate hydronephrosis and proximal hydroureter are noted. This extends to the level of the iliac crest at which point a 5 mm obstructing stone is identified. The more distal left ureter appears within normal limits. Considerable perinephric and periureteral stranding is noted related to the obstructive change.  Diverticulosis without evidence of diverticulitis is noted. The bladder is well distended. The  prostate is enlarged and demonstrates diffuse calcifications. No lymphadenopathy is noted.  The abdominal aorta demonstrates atherosclerotic calcifications. The origins of the visceral vessels and renal arteries appear widely patent. No aneurysmal dilatation or findings of dissection are seen. The iliac arteries are within normal limits bilaterally.  The osseous structures show degenerative change of the lumbar spine and hip joints bilaterally.  Review of the MIP images confirms the above findings.  IMPRESSION: CTA of the chest: No evidence of aortic dissection or pulmonary embolism.  Small calcified pleural plaques on the left.  CTA of the abdomen and pelvis:  No evidence of aortic dissection.  5 mm mid left ureteral stone with hydronephrosis and hydroureter.  Diverticulosis without diverticulitis.  Bilateral renal cystic change.   Electronically Signed   By: Alcide Clever M.D.   On: 05/07/2015 11:26   Ct Cta Abd/pel W/cm &/or W/o Cm  05/07/2015    CLINICAL DATA:  Left lower quadrant pain for 1 week  EXAM: CT ANGIOGRAPHY CHEST, ABDOMEN AND PELVIS  TECHNIQUE: Multidetector CT imaging through the chest, abdomen and pelvis was performed using the standard protocol during bolus administration of intravenous contrast. Multiplanar reconstructed images and MIPs were obtained and reviewed to evaluate the vascular anatomy.  CONTRAST:  OMNIPAQUE IOHEXOL 350 MG/ML SOLN  COMPARISON:  None.  FINDINGS: CTA CHEST FINDINGS  The lungs are well aerated bilaterally without focal infiltrate or sizable effusion. Very minimal left basilar atelectasis is seen. No pneumothorax is noted. Some calcified pleural plaques are noted posteriorly on the left.  The thoracic inlet is within normal limits. The thoracic aorta shows mild calcifications without aneurysmal dilatation. Coronary calcifications are noted particularly in the left anterior descending coronary artery. The origins of the brachiocephalic vessels are within normal limits. Pulmonary artery is visualized although not timed for pulmonary embolism evaluation shows no gross abnormality. No hilar or mediastinal adenopathy is seen.  The bony structures of the thorax show degenerative change of the thoracic spine.  Review of the MIP images confirms the above findings.  CTA ABDOMEN AND PELVIS FINDINGS  The liver is diffusely fatty infiltrated. The gallbladder has been surgically removed. The spleen, adrenal glands and pancreas are within normal limits.  The right kidney demonstrates a normal enhancement pattern with the exception of multiple cystic lesions. No renal calculi or obstructive changes are seen. The right ureter is within normal limits. On the left, the kidney demonstrates a normal enhancement pattern although slightly delayed when compared with the right. Some cystic changes are noted in the upper pole. Moderate hydronephrosis and proximal hydroureter are noted. This extends to the level of the iliac crest at  which point a 5 mm obstructing stone is identified. The more distal left ureter appears within normal limits. Considerable perinephric and periureteral stranding is noted related to the obstructive change.  Diverticulosis without evidence of diverticulitis is noted. The bladder is well distended. The prostate is enlarged and demonstrates diffuse calcifications. No lymphadenopathy is noted.  The abdominal aorta demonstrates atherosclerotic calcifications. The origins of the visceral vessels and renal arteries appear widely patent. No aneurysmal dilatation or findings of dissection are seen. The iliac arteries are within normal limits bilaterally.  The osseous structures show degenerative change of the lumbar spine and hip joints bilaterally.  Review of the MIP images confirms the above findings.  IMPRESSION: CTA of the chest: No evidence of aortic dissection or pulmonary embolism.  Small calcified pleural plaques on the left.  CTA of the abdomen and pelvis:  No evidence of aortic dissection.  5 mm mid left ureteral stone with hydronephrosis and hydroureter.  Diverticulosis without diverticulitis.  Bilateral renal cystic change.   Electronically Signed   By: Alcide Clever M.D.   On: 05/07/2015 11:26     EKG Interpretation None       <ECG> ED ECG REPORT   Date: 05/07/2015  EKG Time: 12:24 PM  Rate: 67  Rhythm: normal sinus rhythm and sinus arrhythmia,  normal EKG, normal sinus rhythm, bifasicular block  Axis: normal  Intervals:right bundle branch block and left anterior fascicular block  ST&T Change: nonspecific  Narrative Interpretation:              MDM   Final diagnoses:  Abdominal pain  Chest pain    Balian Schaller. is a 81 y.o. male here with ab pain, vomiting. Consider SBO vs diverticulitis. Will get labs, UA, CT ab/pel.   12:24 PM Patient was hypertensive and borderline hypoxic. INR 2.3. I was concerned for dissection as well. But CT showed no dissection. Just L 5 mm L  ureteral stone. Pain controlled in the ED. O2 92-93% on RA. BP improved with PO lisinopril (home med). No signs of hypertensive emergency. Will dc home with percocet, flomax, urology f/u.    Richardean Canal, MD 05/07/15 1229

## 2015-05-07 NOTE — Discharge Instructions (Signed)
Take tylenol for pain.   Take percocet for severe pain. DO NOT drive with it.   Take flomax daily.  See a urologist.   Return to ER if you have severe pain, vomiting, chest pain.

## 2015-05-07 NOTE — ED Notes (Signed)
Pt reports that his stomach has been hurting since Thursday, he reports that he can no longer take the pain. He thinks that it may have started because he ate cashews that evening. Pt is pale, diaphoretic and clammy

## 2015-10-27 ENCOUNTER — Telehealth: Payer: Self-pay | Admitting: Radiology

## 2015-10-27 ENCOUNTER — Ambulatory Visit
Admission: EM | Admit: 2015-10-27 | Discharge: 2015-10-27 | Disposition: A | Discharge status: Home / Self Care | Payer: Medicare PPO | Attending: Family Medicine | Admitting: Family Medicine

## 2015-10-27 ENCOUNTER — Ambulatory Visit: Payer: Medicare PPO

## 2015-10-27 ENCOUNTER — Ambulatory Visit (INDEPENDENT_AMBULATORY_CARE_PROVIDER_SITE_OTHER): Payer: Medicare PPO | Admitting: Urology

## 2015-10-27 ENCOUNTER — Encounter: Payer: Self-pay | Admitting: Emergency Medicine

## 2015-10-27 ENCOUNTER — Ambulatory Visit (INDEPENDENT_AMBULATORY_CARE_PROVIDER_SITE_OTHER)
Admit: 2015-10-27 | Discharge: 2015-10-27 | Disposition: A | Discharge status: Home / Self Care | Payer: Medicare PPO | Attending: Family Medicine | Admitting: Family Medicine

## 2015-10-27 ENCOUNTER — Encounter: Payer: Self-pay | Admitting: Urology

## 2015-10-27 VITALS — BP 177/100 | HR 98 | Ht 74.0 in | Wt 235.4 lb

## 2015-10-27 DIAGNOSIS — N200 Calculus of kidney: Secondary | ICD-10-CM

## 2015-10-27 DIAGNOSIS — N1330 Unspecified hydronephrosis: Secondary | ICD-10-CM | POA: Diagnosis not present

## 2015-10-27 DIAGNOSIS — Z8673 Personal history of transient ischemic attack (TIA), and cerebral infarction without residual deficits: Secondary | ICD-10-CM

## 2015-10-27 DIAGNOSIS — N201 Calculus of ureter: Secondary | ICD-10-CM | POA: Diagnosis not present

## 2015-10-27 DIAGNOSIS — N132 Hydronephrosis with renal and ureteral calculous obstruction: Secondary | ICD-10-CM | POA: Diagnosis not present

## 2015-10-27 DIAGNOSIS — E785 Hyperlipidemia, unspecified: Secondary | ICD-10-CM

## 2015-10-27 HISTORY — DX: Personal history of transient ischemic attack (TIA), and cerebral infarction without residual deficits: Z86.73

## 2015-10-27 HISTORY — DX: Hyperlipidemia, unspecified: E78.5

## 2015-10-27 LAB — CBC WITH DIFFERENTIAL/PLATELET
BASOS ABS: 0.1 10*3/uL (ref 0–0.1)
BASOS PCT: 1 %
EOS ABS: 0.1 10*3/uL (ref 0–0.7)
Eosinophils Relative: 0 %
HCT: 51.4 % (ref 40.0–52.0)
HEMOGLOBIN: 17.8 g/dL (ref 13.0–18.0)
LYMPHS ABS: 0.5 10*3/uL — AB (ref 1.0–3.6)
Lymphocytes Relative: 4 %
MCH: 33.2 pg (ref 26.0–34.0)
MCHC: 34.6 g/dL (ref 32.0–36.0)
MCV: 96 fL (ref 80.0–100.0)
Monocytes Absolute: 1.2 10*3/uL — ABNORMAL HIGH (ref 0.2–1.0)
Monocytes Relative: 9 %
NEUTROS PCT: 86 %
Neutro Abs: 10.6 10*3/uL — ABNORMAL HIGH (ref 1.4–6.5)
Platelets: 155 10*3/uL (ref 150–440)
RBC: 5.35 MIL/uL (ref 4.40–5.90)
RDW: 13.4 % (ref 11.5–14.5)
WBC: 12.4 10*3/uL — AB (ref 3.8–10.6)

## 2015-10-27 LAB — COMPREHENSIVE METABOLIC PANEL
ALT: 25 U/L (ref 17–63)
ANION GAP: 10 (ref 5–15)
AST: 41 U/L (ref 15–41)
Albumin: 4.7 g/dL (ref 3.5–5.0)
Alkaline Phosphatase: 43 U/L (ref 38–126)
BUN: 27 mg/dL — ABNORMAL HIGH (ref 6–20)
CALCIUM: 10.6 mg/dL — AB (ref 8.9–10.3)
CHLORIDE: 105 mmol/L (ref 101–111)
CO2: 24 mmol/L (ref 22–32)
Creatinine, Ser: 1.25 mg/dL — ABNORMAL HIGH (ref 0.61–1.24)
GFR, EST NON AFRICAN AMERICAN: 52 mL/min — AB (ref >60)
Glucose, Bld: 259 mg/dL — ABNORMAL HIGH (ref 65–99)
Potassium: 4.2 mmol/L (ref 3.5–5.1)
SODIUM: 139 mmol/L (ref 135–145)
Total Bilirubin: 1.7 mg/dL — ABNORMAL HIGH (ref 0.3–1.2)
Total Protein: 7.7 g/dL (ref 6.5–8.1)

## 2015-10-27 LAB — URINALYSIS COMPLETE WITH MICROSCOPIC (ARMC ONLY)
Bacteria, UA: NONE SEEN
Bilirubin Urine: NEGATIVE
Glucose, UA: 500 mg/dL — AB
LEUKOCYTES UA: NEGATIVE
NITRITE: NEGATIVE
PROTEIN: 100 mg/dL — AB
SPECIFIC GRAVITY, URINE: 1.02 (ref 1.005–1.030)
pH: 6.5 (ref 5.0–8.0)

## 2015-10-27 LAB — URINALYSIS, COMPLETE
Bilirubin, UA: NEGATIVE
KETONES UA: NEGATIVE
Leukocytes, UA: NEGATIVE
NITRITE UA: NEGATIVE
PROTEIN UA: NEGATIVE
UUROB: 0.2 mg/dL (ref 0.2–1.0)
pH, UA: 6 (ref 5.0–7.5)

## 2015-10-27 LAB — LIPASE, BLOOD: LIPASE: 38 U/L (ref 11–51)

## 2015-10-27 LAB — MICROSCOPIC EXAMINATION
Bacteria, UA: NONE SEEN
Epithelial Cells (non renal): NONE SEEN /hpf (ref 0–10)
RBC MICROSCOPIC, UA: NONE SEEN /HPF (ref 0–?)
WBC, UA: NONE SEEN /hpf (ref 0–?)

## 2015-10-27 LAB — AMYLASE: AMYLASE: 51 U/L (ref 28–100)

## 2015-10-27 MED ORDER — TAMSULOSIN HCL 0.4 MG PO CAPS: 0.4000 mg | ORAL_CAPSULE | Freq: Every day | ORAL | Status: DC

## 2015-10-27 MED ORDER — HYDROCODONE-ACETAMINOPHEN 5-325 MG PO TABS: 1.0000 | ORAL_TABLET | Freq: Four times a day (QID) | ORAL | Status: DC | PRN

## 2015-10-27 NOTE — ED Notes (Signed)
Patient states he has had mid to left lower abdominal pain which radiates to his left back.

## 2015-10-27 NOTE — ED Provider Notes (Signed)
CSN: [id number]     Arrival date & time 10/27/15  0708 History   None   Nurses notes were reviewed. Chief Complaint  Patient presents with  . Abdominal Pain   patient is a elderly white male who presents with abdominal pain. States that Monday he was fine with abdominal pain started on Monday night. Difficult sleeping difficulty resting and kept him up most of the night. He said most was a 10 kn of 4-5 out of 10 as far as pain is concerned. He had no actual emesis but states he was sick to stomach most the night. He has had one kidney stone before but it was a very small stones that was never sees procedure removed. Reports pain from his left back that basically starts from his lower pelvic area and goes to his back. Reports decreased appetite as well now. (Consider location/radiation/quality/duration/timing/severity/associated sxs/prior Treatment) HPI  Past Medical History  Diagnosis Date  . Hypertension   . Diabetes mellitus without complication (HCC)     Metformin   Past Surgical History  Procedure Laterality Date  . Cholecystectomy     History reviewed. No pertinent family history. Social History  Substance Use Topics  . Smoking status: Never Smoker   . Smokeless tobacco: None  . Alcohol Use: No    Review of Systems  Constitutional: Positive for activity change and appetite change.  Gastrointestinal: Positive for nausea.  Musculoskeletal: Positive for myalgias.    Allergies  Darvon  Home Medications   Prior to Admission medications   Medication Sig Start Date End Date Taking? Authorizing Provider  aspirin EC 81 MG tablet Take 81 mg by mouth daily.   Yes Historical Provider, MD  atorvastatin (LIPITOR) 40 MG tablet Take 40 mg by mouth at bedtime.   Yes Historical Provider, MD  glipiZIDE (GLUCOTROL XL) 5 MG 24 hr tablet Take 5 mg by mouth daily.   Yes Historical Provider, MD  lisinopril (PRINIVIL,ZESTRIL) 5 MG tablet Take 15 mg by mouth every morning.   Yes Historical  Provider, MD  lisinopril (PRINIVIL,ZESTRIL) 5 MG tablet Take 5 mg by mouth 2 (two) times daily. In the evening at 6 pm and at bedtime   Yes Historical Provider, MD  metFORMIN (GLUCOPHAGE) 1000 MG tablet Take 1,000 mg by mouth 2 (two) times daily.   Yes Historical Provider, MD  metoprolol succinate (TOPROL-XL) 25 MG 24 hr tablet Take 25 mg by mouth 2 (two) times daily.   Yes Historical Provider, MD  vitamin C (ASCORBIC ACID) 500 MG tablet Take 500 mg by mouth daily.   Yes Historical Provider, MD  vitamin E 400 UNIT capsule Take 400 Units by mouth daily.   Yes Historical Provider, MD  warfarin (COUMADIN) 5 MG tablet Take 5 mg by mouth daily.   Yes Historical Provider, MD  oxyCODONE-acetaminophen (PERCOCET) 5-325 MG per tablet Take 1-2 tablets by mouth every 6 (six) hours as needed. 05/07/15   Richardean Canal, MD  tamsulosin (FLOMAX) 0.4 MG CAPS capsule Take 1 capsule (0.4 mg total) by mouth daily. 05/07/15   Richardean Canal, MD   Meds Ordered and Administered this Visit  Medications - No data to display  BP 168/97 mmHg  Pulse 113  Temp(Src) 98.7 F (37.1 C) (Tympanic)  Ht  (1.854 m)  Wt 240 lb (108.863 kg)  BMI 31.67 kg/m2  SpO2 96% No data found.   Physical Exam  Constitutional: He is oriented to person, place, and time. He appears well-nourished.  Elderly  obese white male with difficulty  Ambulating.  HENT:  Head: Normocephalic and atraumatic.  Right Ear: External ear normal.  Left Ear: External ear normal.  Eyes: Pupils are equal, round, and reactive to light.  Neck: Normal range of motion. Neck supple.  Cardiovascular: Normal rate, regular rhythm and normal heart sounds.   No murmur heard. Pulmonary/Chest: Effort normal and breath sounds normal. No respiratory distress.  Abdominal: Bowel sounds are normal. He exhibits distension. He exhibits no mass. There is no hepatosplenomegaly. There is no tenderness. There is no CVA tenderness.  Musculoskeletal: Normal range of motion.   Neurological: He is alert and oriented to person, place, and time.  Skin: Skin is warm and dry.  Psychiatric: He has a normal mood and affect.  Vitals reviewed.   ED Course  Procedures (including critical care time)  Labs Review Labs Reviewed  CBC WITH DIFFERENTIAL/PLATELET - Abnormal; Notable for the following:    WBC 12.4 (*)    Neutro Abs 10.6 (*)    Lymphs Abs 0.5 (*)    Monocytes Absolute 1.2 (*)    All other components within normal limits  URINALYSIS COMPLETEWITH MICROSCOPIC ([facility] ONLY) - Abnormal; Notable for the following:    Glucose, UA 500 (*)    Ketones, ur 1+ (*)    Hgb urine dipstick 2+ (*)    Protein, ur 100 (*)    Squamous Epithelial / LPF 0-5 (*)    All other components within normal limits  COMPREHENSIVE METABOLIC PANEL - Abnormal; Notable for the following:    Glucose, Bld 259 (*)    BUN 27 (*)    Creatinine, Ser 1.25 (*)    Calcium 10.6 (*)    Total Bilirubin 1.7 (*)    GFR calc non Af Amer 52 (*)    All other components within normal limits  URINE CULTURE  AMYLASE  LIPASE, BLOOD    Imaging Review Ct Abdomen Pelvis Wo Contrast  10/27/2015  CLINICAL DATA:  81 year old male with left-sided flank pain and left lower quadrant abdominal pain for the past 3 days. History of kidney stones. EXAM: CT ABDOMEN AND PELVIS WITHOUT CONTRAST TECHNIQUE: Multidetector CT imaging of the abdomen and pelvis was performed following the standard protocol without IV contrast. COMPARISON:  CT of the abdomen pelvis 05/07/2015. FINDINGS: Lower chest: Areas of mild scarring are noted in the lung bases bilaterally. Faint calcifications of the aortic valve. Hepatobiliary: No definite cystic or solid hepatic lesion is identified on today's noncontrast CT examination. Status post cholecystectomy. Pancreas: No pancreatic mass or peripancreatic inflammatory changes identified on today's noncontrast CT examination. Spleen: Unremarkable. Adrenals/Urinary Tract: At the left  ureterovesicular junction there is a 13 x 7 x 6 mm calculus. This is associated with mild proximal hydroureteronephrosis indicative of mild obstruction at this time. No additional calculi are identified within the collecting system of either kidney, along the course of the right ureter or within the lumen of the urinary bladder. There are multiple low-attenuation lesions in both kidneys, which are incompletely characterized on today's noncontrast CT examination, but appear similar to the prior study from 05/07/2015, presumably cysts. Unenhanced appearance of the urinary bladder is otherwise normal. Bilateral adrenal glands are normal in appearance. Stomach/Bowel: Unenhanced appearance of the stomach is normal. No pathologic dilatation of small bowel or colon. Numerous colonic diverticulae are noted, particularly in the distal descending colon and proximal sigmoid colon, without surrounding inflammatory changes to suggest an acute diverticulitis at this time. Vascular/Lymphatic: Atherosclerosis throughout the abdominal and pelvic  vasculature, without evidence of aneurysm. No definite lymphadenopathy noted in the abdomen or pelvis on today's noncontrast CT examination. Reproductive: Prostate gland appears mildly enlarged measuring 6.0 x 6.6 x 6.9 cm and is heterogeneously calcified (nonspecific). Other: No significant volume of ascites.  No pneumoperitoneum. Musculoskeletal: There are no aggressive appearing lytic or blastic lesions noted in the visualized portions of the skeleton. IMPRESSION: 1. 13 x 7 x 6 mm calculus at the left ureterovesicular junction with mild proximal left hydroureteronephrosis indicative of mild obstruction at this time. 2. No additional urinary tract calculi. 3. Colonic diverticulosis without evidence of acute diverticulitis at this time. 4. Multiple low-attenuation renal lesions, similar to prior examinations. Although these are incompletely characterized on today's noncontrast CT  examination, these are presumably cysts. 5. Additional findings, as above. Electronically Signed   By: Trudie Reed M.D.   On: 10/27/2015 09:07     Visual Acuity Review  Right Eye Distance:   Left Eye Distance:   Bilateral Distance:    Right Eye Near:   Left Eye Near:    Bilateral Near:         MDM   1. Kidney stone on left side   2. Hydronephrosis with obstructing calculus     Patient has hydronephrosis on the left kidney. He also has a large stone on the left ureter at least 13 mm locking the left kidney. His retina BUN is also gone up his blood sugars elevated but he does have history of diabetes. The some other electrolyte abnormalities and his white count elevated. Because of the large kidney BX present recommending referral to urology and he will see urologist and prone to urological 1. Ceftin. Patient initially asked for something for pain do not want to give any Toradol because he may need to do some type of emergency urological procedure and concern because of his age. Dilaudid with his gait is agreed not to take anything for pain and to wait to 1:30 and see the urologist.  Hassan Rowan, MD 10/27/15 1009

## 2015-10-27 NOTE — ED Notes (Signed)
[facility].

## 2015-10-27 NOTE — Progress Notes (Signed)
10/27/2015 2:21 PM   Gabriel Hamshomas P Belue Jr. [birth year] [id number]  Referring provider: No referring provider defined for this encounter.  Chief Complaint  Patient presents with  . Nephrolithiasis    New Patient    HPI: 81 yo M with left flank pain/ LLQ pain x 3 days who [facility] earlier [facility].  CT scan showed 13 mm left UVJ stone with proximal hydroureteronephrosis.    He does have a history of kidney stones 1 year ago and passed the stone spontaneously with Flomax.    WBC 12.4  Cr elevated to 1.25 from 1.11  No associated nausea or vomiting.  No fever or chills.    He does have baseline urinary frequency no dysuria or hematuria.    Currently on 81 mg and coumadin for a history of stoke 15 years ago.    Pain controlled by tylenol only.    PMH: Past Medical History  Diagnosis Date  . Hypertension   . Diabetes mellitus without complication (HCC)     Metformin  . Nephrolithiasis   . Cerebral arterial thrombosis 08/09/2011  . Essential (primary) hypertension 09/25/2014  . Type 2 diabetes mellitus (HCC) 09/25/2014  . HLD (hyperlipidemia) 10/27/2015  . Pure hypercholesterolemia 09/25/2014  . Cerebrovascular accident, old 10/27/2015    Surgical History: Past Surgical History  Procedure Laterality Date  . Cholecystectomy      Home Medications:    Medication List       This list is accurate as of: 10/27/15  2:21 PM.  Always use your most recent med list.               aspirin EC 81 MG tablet  Take 81 mg by mouth daily.     atorvastatin 40 MG tablet  Commonly known as:  LIPITOR  Take 40 mg by mouth at bedtime.     glipiZIDE 5 MG 24 hr tablet  Commonly known as:  GLUCOTROL XL  Take 5 mg by mouth daily.     lisinopril 20 MG tablet  Commonly known as:  PRINIVIL,ZESTRIL  Take 20 mg by mouth daily.     metFORMIN 1000 MG tablet  Commonly known as:  GLUCOPHAGE  Take 1,000 mg by mouth 2 (two) times daily.     metoprolol succinate 25  MG 24 hr tablet  Commonly known as:  TOPROL-XL  Take 25 mg by mouth 2 (two) times daily.     vitamin C 500 MG tablet  Commonly known as:  ASCORBIC ACID  Take 500 mg by mouth daily.     vitamin E 400 UNIT capsule  Take 400 Units by mouth daily.     warfarin 5 MG tablet  Commonly known as:  COUMADIN  Take 5 mg by mouth daily.        Allergies:  Allergies  Allergen Reactions  . Darvon [Propoxyphene] Hives    Family History: Family History  Problem Relation Age of Onset  . Prostate cancer Neg Hx   . Bladder Cancer Neg Hx   . Kidney cancer Neg Hx   . Nephrolithiasis Brother     Social History:  reports that he has never smoked. He does not have any smokeless tobacco history on file. He reports that he does not drink alcohol or use illicit drugs.  ROS: UROLOGY Frequent Urination?: Yes Hard to postpone urination?: No Burning/pain with urination?: No Get up at night to urinate?: No Leakage of urine?: No Urine stream starts and stops?: No  Trouble starting stream?: No Do you have to strain to urinate?: No Blood in urine?: No Urinary tract infection?: No Sexually transmitted disease?: No Injury to kidneys or bladder?: No Painful intercourse?: No Weak stream?: No Erection problems?: No Penile pain?: No  Gastrointestinal Nausea?: No Vomiting?: No Indigestion/heartburn?: No Diarrhea?: No Constipation?: No  Constitutional Fever: No Night sweats?: No Weight loss?: No Fatigue?: No  Skin Skin rash/lesions?: No Itching?: No  Eyes Blurred vision?: No Double vision?: No  Ears/Nose/Throat Sore throat?: Yes Sinus problems?: Yes  Hematologic/Lymphatic Swollen glands?: No Easy bruising?: No  Cardiovascular Leg swelling?: No Chest pain?: No  Respiratory Cough?: No Shortness of breath?: No  Endocrine Excessive thirst?: No  Musculoskeletal Back pain?: Yes Joint pain?: Yes  Neurological Headaches?: No Dizziness?: Yes  Psychologic Depression?:  No Anxiety?: No  Physical Exam: BP 177/100 mmHg  Pulse 98  Ht 6' 2" (1.88 m)  Wt 235 lb 6.4 oz (106.777 kg)  BMI 30.21 kg/m2  Constitutional:  Alert and oriented, No acute distress. HEENT: Oolitic AT, moist mucus membranes.  Trachea midline, no masses. Cardiovascular: No clubbing, cyanosis, or edema. Respiratory: Normal respiratory effort, no increased work of breathing. GI: Abdomen is soft, nontender, nondistended, no abdominal masses.   GU: No CVA tenderness.  Skin: No rashes, bruises or suspicious lesions. Lymph: No cervical or inguinal adenopathy. Neurologic: Grossly intact, no focal deficits, moving all 4 extremities. Psychiatric: Normal mood and affect.  Laboratory Data: Lab Results  Component Value Date   WBC 12.4* 10/27/2015   HGB 17.8 10/27/2015   HCT 51.4 10/27/2015   MCV 96.0 10/27/2015   PLT 155 10/27/2015    Lab Results  Component Value Date   CREATININE 1.25* 10/27/2015    Lab Results  Component Value Date   HGBA1C 8.3* 10/25/2014    Urinalysis    Component Value Date/Time   COLORURINE YELLOW 10/27/2015 0751   APPEARANCEUR CLEAR 10/27/2015 0751   LABSPEC 1.020 10/27/2015 0751   PHURINE 6.5 10/27/2015 0751   GLUCOSEU 500* 10/27/2015 0751   HGBUR 2+* 10/27/2015 0751   BILIRUBINUR NEGATIVE 10/27/2015 0751   KETONESUR 1+* 10/27/2015 0751   PROTEINUR 100* 10/27/2015 0751   NITRITE NEGATIVE 10/27/2015 0751   LEUKOCYTESUR NEGATIVE 10/27/2015 0751   Results for orders placed or performed in visit on 10/27/15  Microscopic Examination  Result Value Ref Range   WBC, UA None seen 0 -  5 /hpf   RBC, UA None seen 0 -  2 /hpf   Epithelial Cells (non renal) None seen 0 - 10 /hpf   Bacteria, UA None seen None seen/Few  Urinalysis, Complete  Result Value Ref Range   Specific Gravity, UA <1.005 (L) 1.005 - 1.030   pH, UA 6.0 5.0 - 7.5   Color, UA Yellow Yellow   Appearance Ur Clear Clear   Leukocytes, UA Negative Negative   Protein, UA Negative  Negative/Trace   Glucose, UA Trace (A) Negative   Ketones, UA Negative Negative   RBC, UA Trace (A) Negative   Bilirubin, UA Negative Negative   Urobilinogen, Ur 0.2 0.2 - 1.0 mg/dL   Nitrite, UA Negative Negative   Microscopic Examination See below:      Pertinent Imaging: CLINICAL DATA: 81-year-old male with left-sided flank pain and left lower quadrant abdominal pain for the past 3 days. History of kidney stones.  EXAM: CT ABDOMEN AND PELVIS WITHOUT CONTRAST  TECHNIQUE: Multidetector CT imaging of the abdomen and pelvis was performed following the standard protocol without IV contrast.    COMPARISON: CT of the abdomen pelvis 05/07/2015.  FINDINGS: Lower chest: Areas of mild scarring are noted in the lung bases bilaterally. Faint calcifications of the aortic valve.  Hepatobiliary: No definite cystic or solid hepatic lesion is identified on today's noncontrast CT examination. Status post cholecystectomy.  Pancreas: No pancreatic mass or peripancreatic inflammatory changes identified on today's noncontrast CT examination.  Spleen: Unremarkable.  Adrenals/Urinary Tract: At the left ureterovesicular junction there is a 13 x 7 x 6 mm calculus. This is associated with mild proximal hydroureteronephrosis indicative of mild obstruction at this time. No additional calculi are identified within the collecting system of either kidney, along the course of the right ureter or within the lumen of the urinary bladder. There are multiple low-attenuation lesions in both kidneys, which are incompletely characterized on today's noncontrast CT examination, but appear similar to the prior study from 05/07/2015, presumably cysts. Unenhanced appearance of the urinary bladder is otherwise normal. Bilateral adrenal glands are normal in appearance.  Stomach/Bowel: Unenhanced appearance of the stomach is normal. No pathologic dilatation of small bowel or colon. Numerous  colonic diverticulae are noted, particularly in the distal descending colon and proximal sigmoid colon, without surrounding inflammatory changes to suggest an acute diverticulitis at this time.  Vascular/Lymphatic: Atherosclerosis throughout the abdominal and pelvic vasculature, without evidence of aneurysm. No definite lymphadenopathy noted in the abdomen or pelvis on today's noncontrast CT examination.  Reproductive: Prostate gland appears mildly enlarged measuring 6.0 x 6.6 x 6.9 cm and is heterogeneously calcified (nonspecific).  Other: No significant volume of ascites. No pneumoperitoneum.  Musculoskeletal: There are no aggressive appearing lytic or blastic lesions noted in the visualized portions of the skeleton.  IMPRESSION: 1. 13 x 7 x 6 mm calculus at the left ureterovesicular junction with mild proximal left hydroureteronephrosis indicative of mild obstruction at this time. 2. No additional urinary tract calculi. 3. Colonic diverticulosis without evidence of acute diverticulitis at this time. 4. Multiple low-attenuation renal lesions, similar to prior examinations. Although these are incompletely characterized on today's noncontrast CT examination, these are presumably cysts. 5. Additional findings, as above.   Electronically Signed  By: Trudie Reedaniel Entrikin M.D.  On: 10/27/2015 09:07  Assessment & Plan:   81 year old male with 13 mm left UVJ stone , hydronephrosis. No signs or symptoms of infection.  1. Nephrolithiasis  Given the size of the stone, I do feel it is unlikely that it'll pass spontaneously although not impossible based on the shape of the stone. We will start him on Flomax in hopes of spontaneous passage, although , recommend scheduling intervention in the interim .We discussed various treatment options including ESWL vs. ureteroscopy, laser lithotripsy, and stent. We discussed the risks and benefits of both including bleeding, infection, damage to  surrounding structures, efficacy with need for possible further intervention, and need for temporary ureteral stent.     He would like to proceed with left ureteroscopy and we will book him for early next week. He was advised to call or present to the emergency room if he develops any fevers, chills, or any other worrisome symptoms in the meantime and the importance of seeking immediate medical attention in the situation.   He is currently on anticoagulation and ideally, we could stop Coumadin for the procedure. We will touch base with his PCP in order to obtain clearance. If unable to stop, procedure could possibly be done on coumadin/ ASA  With slightly increased risk of bleeding or impaired visualization of which the patient is aware.  -  Urinalysis, Complete - CULTURE, URINE COMPREHENSIVE  2. Left ureteral calculus As above. - tamsulosin (FLOMAX) 0.4 MG CAPS capsule; Take 1 capsule (0.4 mg total) by mouth daily.  Dispense: 30 capsule; Refill: 0 - HYDROcodone-acetaminophen (NORCO/VICODIN) 5-325 MG tablet; Take 1-2 tablets by mouth every 6 (six) hours as needed for moderate pain.  Dispense: 10 tablet; Refill: 0  3. Hydronephrosis, left As above   Schedule surgery.   Vanna Scotland, MD  [facility] [address], [address] [place], [place] [address] [phone]

## 2015-10-27 NOTE — Patient Instructions (Signed)
° °                                                      Ureteroscopy ° °A Ureteroscopy is an examination of the upper urinary tract, performed with a ureteroscope that is passed through the urethra, the bladder, and then directly into the ureter. It is performed to find the cause of urine blockage in a ureter, perform a biopsy or identify and evaluate other abnormalities inside the ureters or kidneys. It is useful in the diagnosis and treatment of disorders such as kidney stones, ureteral strictures or other abnormalities of the ureter. Smaller stones in the bladder or lower ureter can be removed in one piece, while bigger ones are usually broken before removal during ureteroscopy. ° °*After your ureteroscopy, your doctor may need to place a stent in a ureter to drain urine from the kidney to the bladder while swelling in the ureter goes away. The stent may cause some discomfort to your kidney and ureter. The discomfort is generally mild. The stent may be left in for a week or more. You will be instructed to either remove the stent yourself at home by pulling a string protruding from your urethra or to return to our office for removal by your doctor. ° °After a ureteroscopy you may  °have a mild burning feeling when urinating °see small amounts of blood in the urine °have mild discomfort in the bladder area or kidney area when urinating °need to urinate more frequently or urgently °*These problems should not last more than 24 hours unless a ureteral stent was placed. ° °If a stent was placed symptoms symptoms may continue until it is removed. You should notify your doctor right away if bleeding or pain is severe. ° °To prevent or relieve discomfort it can be helpful to °drink 16 ounces of water each hour for 2 hours after the procedure °take a warm bath to relieve the burning feeling °hold a warm, damp washcloth over the urethral opening to relieve discomfort °take an over-the-counter pain reliever ° °Please notify  our office if you experience any of the following symptoms °burning with urination lasting more than 24hrs °Fever greater than 100F or present directly to emergency department °abdominal or flank pain °very bloody, cloudy or foul smelling urine ° °*If you have any additional questions or concerns please call our office at [phone] ° °[facility] °[address], [address] °[place], [place] [address] °[phone] ° ° ° ° ° °

## 2015-10-27 NOTE — Discharge Instructions (Signed)
Kidney Stones Kidney stones (urolithiasis) are solid masses that form inside your kidneys. The intense pain is caused by the stone moving through the kidney, ureter, bladder, and urethra (urinary tract). When the stone moves, the ureter starts to spasm around the stone. The stone is usually passed in your pee (urine).  HOME CARE  Drink enough fluids to keep your pee clear or pale yellow. This helps to get the stone out.  Take a 24-hour pee (urine) sample as told by your doctor. You may need to take another sample every 6-12 months.  Strain all pee through the provided strainer. Do not pee without peeing through the strainer, not even once. If you pee the stone out, catch it in the strainer. The stone may be as small as a grain of salt. Take this to your doctor. This will help your doctor figure out what you can do to try to prevent more kidney stones.  Only take medicine as told by your doctor.  Make changes to your daily diet as told by your doctor. You may be told to:  Limit how much salt you eat.  Eat 5 or more servings of fruits and vegetables each day.  Limit how much meat, poultry, fish, and eggs you eat.  Keep all follow-up visits as told by your doctor. This is important.  Get follow-up X-rays as told by your doctor. GET HELP IF: You have pain that gets worse even if you have been taking pain medicine. GET HELP RIGHT AWAY IF:   Your pain does not get better with medicine.  You have a fever or shaking chills.  Your pain increases and gets worse over 18 hours.  You have new belly (abdominal) pain.  You feel faint or pass out.  You are unable to pee.   This information is not intended to replace advice given to you by your health care provider. Make sure you discuss any questions you have with your health care provider.   Document Released: 05/16/2008 Document Revised: 08/19/2015 Document Reviewed: 05/01/2013 Elsevier Interactive Patient Education [employer]2016 Else[employer]  Lithotripsy Lithotripsy is a treatment that can sometimes help eliminate kidney stones and pain that they cause. A form of lithotripsy, also known as extracorporeal shock wave lithotripsy, is a nonsurgical procedure that helps your body rid itself of the kidney stone when it is too big to pass on its own. Extracorporeal shock wave lithotripsy is a method of crushing a kidney stone with shock waves. These shock waves pass through your body and are focused on your stone. They cause the kidney stones to crumble while still in the urinary tract. It is then easier for the smaller pieces of stone to pass in the urine. Lithotripsy usually takes about an hour. It is done in a hospital, a lithotripsy center, or a mobile unit. It usually does not require an overnight stay. Your health care provider will instruct you on preparation for the procedure. Your health care provider will tell you what to expect afterward. LET [facility] PROVIDER KNOW ABOUT:  Any allergies you have.  All medicines you are taking, including vitamins, herbs, eye drops, creams, and over-the-counter medicines.  Previous problems you or members of your family have had with the use of anesthetics.  Any blood disorders you have.  Previous surgeries you have had.  Medical conditions you have. RISKS AND COMPLICATIONS Generally, lithotripsy for kidney stones is a safe procedure. However, as with any procedure, complications can occur. Possible complications include:  Infection.  Bleeding of the kidney.  Bruising of the kidney or skin.  Obstruction of the ureter.  Failure of the stone to fragment. BEFORE THE PROCEDURE  Do not eat or drink for 6-8 hours prior to the procedure. You may, however, take the medications with a sip of water that your physician instructs you to take  Do not take aspirin or aspirin-containing products for 7 days prior to your procedure  Do not take nonsteroidal anti-inflammatory products  for 7 days prior to your procedure PROCEDURE A stent (flexible tube with holes) may be placed in your ureter. The ureter is the tube that transports the urine from the kidneys to the bladder. Your health care provider may place a stent before the procedure. This will help keep urine flowing from the kidney if the fragments of the stone block the ureter. You may have an IV tube placed in one of your veins to give you fluids and medicines. These medicines may help you relax or make you sleep. During the procedure, you will lie comfortably on a fluid-filled cushion or in a warm-water bath. After an X-ray or ultrasound exam to locate your stone, shock waves are aimed at the stone. If you are awake, you may feel a tapping sensation as the shock waves pass through your body. If large stone particles remain after treatment, a second procedure may be necessary at a later date. For comfort during the test:  Relax as much as possible.  Try to remain still as much as possible.  Try to follow instructions to speed up the test.  Let your health care provider know if you are uncomfortable, anxious, or in pain. AFTER THE PROCEDURE  After surgery, you will be taken to the recovery area. A nurse will watch and check your progress. Once you're awake, stable, and taking fluids well, you will be allowed to go home as long as there are no problems. You will also be allowed to pass your urine before discharge.You may be given antibiotics to help prevent infection. You may also be prescribed pain medicine if needed. In a week or two, your health care provider may remove your stent, if you have one. You may first have an X-ray exam to check on how successful the fragmentation of your stone has been and how much of the stone has passed. Your health care provider will check to see whether or not stone particles remain. SEEK IMMEDIATE MEDICAL CARE IF:  You develop a fever or shaking chills.  Your pain is not relieved by  medicine.  You feel sick to your stomach (nauseated) and you vomit.  You develop heavy bleeding.  You have difficulty urinating.  You start to pass your stent from your penis.   This information is not intended to replace advice given to you by your health care provider. Make sure you discuss any questions you have with your health care provider.   Document Released: 11/25/2000 Document Revised: 12/19/2014 Document Reviewed: 06/13/2013 Elsevier Interactive Patient Education [employer]

## 2015-10-28 LAB — URINE CULTURE
CULTURE: NO GROWTH
SPECIAL REQUESTS: NORMAL

## 2015-10-29 LAB — CULTURE, URINE COMPREHENSIVE

## 2015-10-29 NOTE — Telephone Encounter (Signed)
LMOM regarding cardiac clearance needed from Dr Zada Finderslmedo & pre-admit appt scheduled 11/18.

## 2015-10-30 ENCOUNTER — Encounter
Admission: RE | Admit: 2015-10-30 | Discharge: 2015-10-30 | Disposition: A | Discharge status: Home / Self Care | Payer: Medicare PPO | Source: Ambulatory Visit | Attending: Urology | Admitting: Urology

## 2015-10-30 ENCOUNTER — Other Ambulatory Visit: Payer: Self-pay | Admitting: Cardiology

## 2015-10-30 DIAGNOSIS — R0789 Other chest pain: Secondary | ICD-10-CM

## 2015-10-30 NOTE — Patient Instructions (Signed)
  Your procedure is scheduled on: Monday 11/02/2015 Report to Day Surgery. 2ND FLOOR MEDICAL MALL ENTRANCE To find out your arrival time please call [phone] between 1PM - 3PM on TODAY 10/30/2015.  Remember: Instructions that are not followed completely may result in serious medical risk, up to and including death, or upon the discretion of your surgeon and anesthesiologist your surgery may need to be rescheduled.    __X__ 1. Do not eat food or drink liquids after midnight. No gum chewing or hard candies.     __X__ 2. No Alcohol for 24 hours before or after surgery.   ____ 3. Bring all medications with you on the day of surgery if instructed.    __X__ 4. Notify your doctor if there is any change in your medical condition     (cold, fever, infections).     Do not wear jewelry, make-up, hairpins, clips or nail polish.  Do not wear lotions, powders, or perfumes. You may wear deodorant.  Do not shave 48 hours prior to surgery. Men may shave face and neck.  Do not bring valuables to the hospital.    [facility] for any belongings or valuables.               Contacts, dentures or bridgework may not be worn into surgery.  Leave your suitcase in the car. After surgery it may be brought to your room.  For patients admitted to the hospital, discharge time is determined by your                treatment team.   Patients discharged the day of surgery will not be allowed to drive home.   Please read over the following fact sheets that you were given:   Surgical Site Infection Prevention   __X__ Take these medicines the morning of surgery with A SIP OF WATER:    1. LISINOPRIL  2. METOPROLOL  3.   4.  5.  6.  ____ Fleet Enema (as directed)   ____ Use CHG Soap as directed  ____ Use inhalers on the day of surgery  __X__ Stop metformin 2 days prior to surgery    ____ Take 1/2 of usual insulin dose the night before surgery and none on the morning of surgery.    __X__ Stop Coumadin/Plavix/aspirin on TODAY, CONTINUE ASPIRIN AS DIRECTED BY CARDIOLOGIST  ____ Stop Anti-inflammatories on    __X__ Stop supplements until after surgery.  VITAMIN E, VITAMIN C  ____ Bring C-Pap to the hospital.

## 2015-11-02 ENCOUNTER — Ambulatory Visit: Payer: Medicare PPO | Admitting: *Deleted

## 2015-11-02 ENCOUNTER — Ambulatory Visit
Admission: RE | Admit: 2015-11-02 | Discharge: 2015-11-02 | Disposition: A | Discharge status: Home / Self Care | Payer: Medicare PPO | Source: Ambulatory Visit | Attending: Urology | Admitting: Urology

## 2015-11-02 ENCOUNTER — Encounter
Admission: RE | Disposition: A | Discharge status: Home / Self Care | Payer: Self-pay | Source: Ambulatory Visit | Attending: Urology

## 2015-11-02 ENCOUNTER — Encounter: Payer: Self-pay | Admitting: *Deleted

## 2015-11-02 ENCOUNTER — Ambulatory Visit: Admission: RE | Admit: 2015-11-02 | Payer: Medicare PPO | Source: Ambulatory Visit

## 2015-11-02 DIAGNOSIS — K5730 Diverticulosis of large intestine without perforation or abscess without bleeding: Secondary | ICD-10-CM | POA: Diagnosis not present

## 2015-11-02 DIAGNOSIS — Z79899 Other long term (current) drug therapy: Secondary | ICD-10-CM | POA: Diagnosis not present

## 2015-11-02 DIAGNOSIS — E119 Type 2 diabetes mellitus without complications: Secondary | ICD-10-CM | POA: Insufficient documentation

## 2015-11-02 DIAGNOSIS — Z7982 Long term (current) use of aspirin: Secondary | ICD-10-CM | POA: Insufficient documentation

## 2015-11-02 DIAGNOSIS — Z7984 Long term (current) use of oral hypoglycemic drugs: Secondary | ICD-10-CM | POA: Diagnosis not present

## 2015-11-02 DIAGNOSIS — N132 Hydronephrosis with renal and ureteral calculous obstruction: Secondary | ICD-10-CM | POA: Insufficient documentation

## 2015-11-02 DIAGNOSIS — Z7901 Long term (current) use of anticoagulants: Secondary | ICD-10-CM | POA: Diagnosis not present

## 2015-11-02 DIAGNOSIS — R1032 Left lower quadrant pain: Secondary | ICD-10-CM | POA: Diagnosis not present

## 2015-11-02 DIAGNOSIS — R350 Frequency of micturition: Secondary | ICD-10-CM | POA: Insufficient documentation

## 2015-11-02 DIAGNOSIS — N201 Calculus of ureter: Secondary | ICD-10-CM | POA: Diagnosis not present

## 2015-11-02 DIAGNOSIS — Z885 Allergy status to narcotic agent status: Secondary | ICD-10-CM | POA: Diagnosis not present

## 2015-11-02 DIAGNOSIS — E7800 Pure hypercholesterolemia, unspecified: Secondary | ICD-10-CM | POA: Diagnosis not present

## 2015-11-02 DIAGNOSIS — Z8673 Personal history of transient ischemic attack (TIA), and cerebral infarction without residual deficits: Secondary | ICD-10-CM | POA: Diagnosis not present

## 2015-11-02 DIAGNOSIS — I10 Essential (primary) hypertension: Secondary | ICD-10-CM | POA: Insufficient documentation

## 2015-11-02 DIAGNOSIS — Z841 Family history of disorders of kidney and ureter: Secondary | ICD-10-CM | POA: Insufficient documentation

## 2015-11-02 DIAGNOSIS — Z87442 Personal history of urinary calculi: Secondary | ICD-10-CM | POA: Insufficient documentation

## 2015-11-02 HISTORY — PX: CYSTOSCOPY WITH STENT PLACEMENT: SHX5790

## 2015-11-02 HISTORY — PX: URETEROSCOPY WITH HOLMIUM LASER LITHOTRIPSY: SHX6645

## 2015-11-02 LAB — GLUCOSE, CAPILLARY
GLUCOSE-CAPILLARY: 188 mg/dL — AB (ref 65–99)
Glucose-Capillary: 199 mg/dL — ABNORMAL HIGH (ref 65–99)

## 2015-11-02 LAB — PROTIME-INR
INR: 1.43
PROTHROMBIN TIME: 17.5 s — AB (ref 11.4–15.0)

## 2015-11-02 SURGERY — URETEROSCOPY, WITH LITHOTRIPSY USING HOLMIUM LASER
Anesthesia: General | Laterality: Left | Wound class: Clean Contaminated

## 2015-11-02 MED ORDER — LACTATED RINGERS IV SOLN
INTRAVENOUS | Status: DC | PRN
  Administered 2015-11-02: 09:00:00 via INTRAVENOUS

## 2015-11-02 MED ORDER — ONDANSETRON HCL 4 MG/2ML IJ SOLN: 4.0000 mg | Freq: Once | INTRAMUSCULAR | Status: DC | PRN

## 2015-11-02 MED ORDER — ONDANSETRON HCL 4 MG/2ML IJ SOLN
INTRAMUSCULAR | Status: DC | PRN
  Administered 2015-11-02: 4 mg via INTRAVENOUS

## 2015-11-02 MED ORDER — CEFAZOLIN SODIUM-DEXTROSE 2-3 GM-% IV SOLR
INTRAVENOUS | Status: AC
  Filled 2015-11-02: qty 50

## 2015-11-02 MED ORDER — LABETALOL HCL 5 MG/ML IV SOLN
INTRAVENOUS | Status: AC
  Administered 2015-11-02: 10 mg via INTRAVENOUS
  Filled 2015-11-02: qty 4

## 2015-11-02 MED ORDER — LABETALOL HCL 5 MG/ML IV SOLN
10.0000 mg | Freq: Once | INTRAVENOUS | Status: AC
  Administered 2015-11-02: 10 mg via INTRAVENOUS

## 2015-11-02 MED ORDER — PROPOFOL 10 MG/ML IV BOLUS
INTRAVENOUS | Status: DC | PRN
  Administered 2015-11-02: 150 mg via INTRAVENOUS
  Administered 2015-11-02: 50 mg via INTRAVENOUS

## 2015-11-02 MED ORDER — FAMOTIDINE 20 MG PO TABS
ORAL_TABLET | ORAL | Status: AC
  Filled 2015-11-02: qty 1

## 2015-11-02 MED ORDER — FAMOTIDINE 20 MG PO TABS
20.0000 mg | ORAL_TABLET | Freq: Once | ORAL | Status: AC
  Administered 2015-11-02: 20 mg via ORAL

## 2015-11-02 MED ORDER — IOTHALAMATE MEGLUMINE 17.2 % UR SOLN
URETHRAL | Status: DC | PRN
  Administered 2015-11-02: 60 mL via URETHRAL

## 2015-11-02 MED ORDER — OXYBUTYNIN CHLORIDE 5 MG PO TABS: 5.0000 mg | ORAL_TABLET | Freq: Three times a day (TID) | ORAL | Status: DC | PRN

## 2015-11-02 MED ORDER — HYDROMORPHONE HCL 1 MG/ML IJ SOLN: 0.2500 mg | INTRAMUSCULAR | Status: DC | PRN

## 2015-11-02 MED ORDER — HYDROCODONE-ACETAMINOPHEN 5-325 MG PO TABS: 1.0000 | ORAL_TABLET | Freq: Four times a day (QID) | ORAL | Status: DC | PRN

## 2015-11-02 MED ORDER — CEFAZOLIN SODIUM-DEXTROSE 2-3 GM-% IV SOLR
2.0000 g | Freq: Once | INTRAVENOUS | Status: AC
  Administered 2015-11-02: 2 g via INTRAVENOUS

## 2015-11-02 MED ORDER — SODIUM CHLORIDE 0.9 % IV SOLN
INTRAVENOUS | Status: DC
  Administered 2015-11-02: 09:00:00 via INTRAVENOUS

## 2015-11-02 SURGICAL SUPPLY — 36 items
ADAPTER SCOPE UROLOK II (MISCELLANEOUS) IMPLANT
BAG DRAIN CYSTO-URO LG1000N (MISCELLANEOUS) ×3 IMPLANT
BASKET ZERO TIP 1.9FR (BASKET) ×3 IMPLANT
CATH URETL 5X70 OPEN END (CATHETERS) ×3 IMPLANT
CLOSURE WOUND 1/2 X4 (GAUZE/BANDAGES/DRESSINGS) ×1
CNTNR SPEC 2.5X3XGRAD LEK (MISCELLANEOUS) ×1
CONRAY 43 FOR UROLOGY 50M (MISCELLANEOUS) ×3 IMPLANT
CONT SPEC 4OZ STER OR WHT (MISCELLANEOUS) ×2
CONTAINER SPEC 2.5X3XGRAD LEK (MISCELLANEOUS) ×1 IMPLANT
FEE TECHNICIAN ONLY PER HOUR (MISCELLANEOUS) IMPLANT
GLOVE BIO SURGEON STRL SZ 6.5 (GLOVE) ×2 IMPLANT
GLOVE BIO SURGEON STRL SZ7 (GLOVE) ×6 IMPLANT
GLOVE BIO SURGEONS STRL SZ 6.5 (GLOVE) ×1
GOWN STRL REUS W/ TWL LRG LVL4 (GOWN DISPOSABLE) ×2 IMPLANT
GOWN STRL REUS W/TWL LRG LVL4 (GOWN DISPOSABLE) ×4
INTRODUCER DILATOR DOUBLE (INTRODUCER) IMPLANT
KIT RM TURNOVER CYSTO AR (KITS) ×3 IMPLANT
LASER HOLMIUM FIBER SU 272UM (MISCELLANEOUS) IMPLANT
LASER HOLMIUM FIBER SU 550UM (MISCELLANEOUS) ×3 IMPLANT
LASER HOLMIUM SU 200UM (MISCELLANEOUS) ×3 IMPLANT
PACK CYSTO AR (MISCELLANEOUS) ×3 IMPLANT
PREP PVP WINGED SPONGE (MISCELLANEOUS) ×3 IMPLANT
PUMP SINGLE ACTION SAP (PUMP) IMPLANT
SENSORWIRE 0.038 NOT ANGLED (WIRE) ×6
SET CYSTO W/LG BORE CLAMP LF (SET/KITS/TRAYS/PACK) ×3 IMPLANT
SHEATH URETERAL 12FRX35CM (MISCELLANEOUS) IMPLANT
SOL .9 NS 3000ML IRR  AL (IV SOLUTION) ×2
SOL .9 NS 3000ML IRR UROMATIC (IV SOLUTION) ×1 IMPLANT
STENT URET 6FRX24 CONTOUR (STENTS) IMPLANT
STENT URET 6FRX26 CONTOUR (STENTS) ×3 IMPLANT
STENT URET 6FRX26 FIRM (Stent) IMPLANT
STRIP CLOSURE SKIN 1/2X4 (GAUZE/BANDAGES/DRESSINGS) ×2 IMPLANT
SURGILUBE 2OZ TUBE FLIPTOP (MISCELLANEOUS) ×3 IMPLANT
SYRINGE IRR TOOMEY STRL 70CC (SYRINGE) ×3 IMPLANT
WATER STERILE IRR 1000ML POUR (IV SOLUTION) ×3 IMPLANT
WIRE SENSOR 0.038 NOT ANGLED (WIRE) ×2 IMPLANT

## 2015-11-02 NOTE — Op Note (Signed)
Preoperative diagnosis:  1. Left distal ureteral stone  Postoperative diagnosis:  1. Same as above  Procedure: 1. Left ureteroscopy 2. Laser lithotripsy 3. Left ureteral stent placement  4. Retrograde pyelogram  Surgeon: Vanna ScotlandAshley Tamer Baughman, MD  Anesthesia: General  Complications: None  Intraoperative findings: 13 mm left distal ureteral stone identified, obliterated, all fragments removed  EBL: Minimal  Specimens: Stone fragment  Drains: 6 x 26 French double-J ureteral stent on left  Indication: Mirian CapuchinKimberly Hill is a 81 y.o. patient with 13 mm left distal obstructing ureteral stone.  After reviewing the management options for treatment, he elected to proceed with the above surgical procedure(s). We have discussed the potential benefits and risks of the procedure, side effects of the proposed treatment, the likelihood of the patient achieving the goals of the procedure, and any potential problems that might occur during the procedure or recuperation. Informed consent has been obtained.  Description of procedure:  The patient was taken to the operating room and general anesthesia was induced. The patient was placed in the dorsal lithotomy position, prepped and draped in the usual sterile fashion, and preoperative antibiotics were administered. A preoperative time-out was performed.   A rigid cystoscope was advanced per urethra into the bladder. Attention was turned to the left ureteral orifice which was cannulated using a 5 [place]French open-ended ureteral catheter and a sensor wire (stone could be seen just at mouth of UO). The sensor wire was able to place all the way up to the kidney without difficulty informed under fluoroscopic guidance. The shadow of the stone could be seen within the distal ureter on fluoroscopy. A rigid ureteroscope was then advanced just within the UO without difficulty and the stone was identified. A 365  laser fiber was then used to smooth settings of 0.8 J and 8  Hz to fragment the stone into a proximally 15 smaller fragments. The majority of these fragments were washed out of the ureter and a few small fragments were basketed using a 1.9 [place] nitinol tip less basket. The scope was then advanced into the mid ureter (to the level of the iliacs) without difficulty and there was no residual fragments. The ureter was clear of any obstruction.  A retrograde pyelogram was performed showing dilation of the collecting system down to the distal ureter, no filling defects or extravasation.  The wire was then back loaded over the cystoscope and a 6 x 26 French double-J ureteral stent was advanced over the wire to level of the renal pelvis confirmed under fluoroscopic guidance. The wire was partially removed and a full coil was noted within the renal pelvis. The wire was then fully withdrawn and a coil was noted within the bladder.  The bladder then was then drained using the access sheath of the scope. Small fragments are passed the table for stone specimen. The patient was then cleaned and dried .  He was then repositioned the supine position, reversed from anesthesia, taken the PACU in stable condition.  Plan: Cysto, stent removal in 1 week  Vanna ScotlandAshley Kainat Pizana, M.D.

## 2015-11-02 NOTE — Anesthesia Postprocedure Evaluation (Signed)
Anesthesia Post Note  Patient: Gregor Hamshomas P Smalling Jr.  Procedure(s) Performed: Procedure(s) (LRB): URETEROSCOPY WITH HOLMIUM LASER LITHOTRIPSY (Left) CYSTOSCOPY WITH STENT PLACEMENT (Left)  Patient location during evaluation: PACU Anesthesia Type: General Level of consciousness: awake and alert Pain management: pain level controlled Vital Signs Assessment: post-procedure vital signs reviewed and stable Respiratory status: spontaneous breathing Cardiovascular status: blood pressure returned to baseline (After two doses of labetolol, returned to his baseline.) Postop Assessment: No headache Anesthetic complications: no    Last Vitals:  Filed Vitals:   11/02/15 1138 11/02/15 1153  BP:  167/85  Pulse:  75  Temp: 36.3 C 35.9 C  Resp:  16    Last Pain:  Filed Vitals:   11/02/15 1201  PainSc: 3                  Kao Conry M

## 2015-11-02 NOTE — Discharge Instructions (Signed)
You have a ureteral stent in place.  This is a tube that extends from your kidney to your bladder.  This may cause urinary bleeding, burning with urination, and urinary frequency.  Please call our office or present to the ED if you develop fevers >101 or pain which is not able to be controlled with oral pain medications.  You may be given either Flomax and/ or ditropan to help with bladder spasms and stent pain in addition to pain medications.    Please drink PLENTY of WATER!  [facility] [address], [address] [place], [place] [address] [phone] General Anesthesia, Adult, Care After Refer to this sheet in the next few weeks. These instructions provide you with information on caring for yourself after your procedure. Your health care provider may also give you more specific instructions. Your treatment has been planned according to current medical practices, but problems sometimes occur. Call your health care provider if you have any problems or questions after your procedure. WHAT TO EXPECT AFTER THE PROCEDURE After the procedure, it is typical to experience:  Sleepiness.  Nausea and vomiting. HOME CARE INSTRUCTIONS  For the first 24 hours after general anesthesia:  Have a responsible person with you.  Do not drive a car. If you are alone, do not take public transportation.  Do not drink alcohol.  Do not take medicine that has not been prescribed by your health care provider.  Do not sign important papers or make important decisions.  You may resume a normal diet and activities as directed by your health care provider.  Change bandages (dressings) as directed.  If you have questions or problems that seem related to general anesthesia, call the hospital and ask for the anesthetist or anesthesiologist on call. SEEK MEDICAL CARE IF:  You have nausea and vomiting that continue the day after anesthesia.  You develop a rash. SEEK IMMEDIATE MEDICAL  CARE IF:   You have difficulty breathing.  You have chest pain.  You have any allergic problems.   This information is not intended to replace advice given to you by your health care provider. Make sure you discuss any questions you have with your health care provider.   Document Released: 03/06/2001 Document Revised: 12/19/2014 Document Reviewed: 03/28/2012 Elsevier Interactive Patient Education [employer]2016 [employer]vier Inc.

## 2015-11-02 NOTE — Interval H&P Note (Signed)
History and Physical Interval Note:  11/02/2015 8:49 AM  Gabriel Hamshomas P Witting Jr.  has presented today for surgery, with the diagnosis of URETERAL CALCULUS,HYDRONEPHROSIS  The various methods of treatment have been discussed with the patient and family. After consideration of risks, benefits and other options for treatment, the patient has consented to  Procedure(s): URETEROSCOPY WITH HOLMIUM LASER LITHOTRIPSY (Left) CYSTOSCOPY WITH STENT PLACEMENT (Left) as a surgical intervention .  The patient's history has been reviewed, patient examined, no change in status, stable for surgery.  I have reviewed the patient's chart and labs.  Questions were answered to the patient's satisfaction.    RRR CTAB  Preop UCx neg   Vanna ScotlandAshley Cartel Mauss

## 2015-11-02 NOTE — Anesthesia Preprocedure Evaluation (Addendum)
Anesthesia Evaluation  Patient identified by MRN, date of birth, ID band  Reviewed: Allergy & Precautions, NPO status , Patient's Chart, lab work & pertinent test results  Airway Mallampati: II  TM Distance: >3 FB Neck ROM: Limited    Dental  (+) Upper Dentures, Lower Dentures   Pulmonary    Pulmonary exam normal        Cardiovascular Exercise Tolerance: Poor hypertension, Pt. on medications and Pt. on home beta blockers  Rhythm:Regular Rate:Normal  Sinus, RBBB on ECG.   Neuro/Psych Remote CVA, has done well, denies sx. CVA, No Residual Symptoms    GI/Hepatic   Endo/Other  diabetes, Type 2BG 188.  Renal/GU Renal InsufficiencyRenal disease     Musculoskeletal   Abdominal (+)  Abdomen: soft.    Peds  Hematology   Anesthesia Other Findings   Reproductive/Obstetrics                            Anesthesia Physical Anesthesia Plan  ASA: III  Anesthesia Plan: General   Post-op Pain Management:    Induction: Intravenous  Airway Management Planned: LMA  Additional Equipment:   Intra-op Plan:   Post-operative Plan: Extubation in OR  Informed Consent: I have reviewed the patients History and Physical, chart, labs and discussed the procedure including the risks, benefits and alternatives for the proposed anesthesia with the patient or authorized representative who has indicated his/her understanding and acceptance.     Plan Discussed with: CRNA  Anesthesia Plan Comments:         Anesthesia Quick Evaluation

## 2015-11-02 NOTE — Anesthesia Procedure Notes (Signed)
Procedure Name: LMA Insertion Date/Time: 11/02/2015 9:24 AM Performed by: Edyth GunnelsGILBERT, Hermelinda Diegel Pre-anesthesia Checklist: Patient identified, Emergency Drugs available, Suction available, Patient being monitored and Timeout performed Patient Re-evaluated:Patient Re-evaluated prior to inductionOxygen Delivery Method: Circle system utilized Preoxygenation: Pre-oxygenation with 100% oxygen Intubation Type: IV induction Ventilation: Mask ventilation without difficulty LMA: LMA inserted LMA Size: 4.5 Number of attempts: 1

## 2015-11-02 NOTE — H&P (View-Only) (Signed)
10/27/2015 2:21 PM   Gabriel Hamshomas P Belue Jr. [birth year] [id number]  Referring provider: No referring provider defined for this encounter.  Chief Complaint  Patient presents with  . Nephrolithiasis    New Patient    HPI: 81 yo M with left flank pain/ LLQ pain x 3 days who [facility]  CT scan showed 13 mm left UVJ stone with proximal hydroureteronephrosis.    He does have a history of kidney stones 1 year ago and passed the stone spontaneously with Flomax.    WBC 12.4  Cr elevated to 1.25 from 1.11  No associated nausea or vomiting.  No fever or chills.    He does have baseline urinary frequency no dysuria or hematuria.    Currently on 81 mg and coumadin for a history of stoke 15 years ago.    Pain controlled by tylenol only.    PMH: Past Medical History  Diagnosis Date  . Hypertension   . Diabetes mellitus without complication (HCC)     Metformin  . Nephrolithiasis   . Cerebral arterial thrombosis 08/09/2011  . Essential (primary) hypertension 09/25/2014  . Type 2 diabetes mellitus (HCC) 09/25/2014  . HLD (hyperlipidemia) 10/27/2015  . Pure hypercholesterolemia 09/25/2014  . Cerebrovascular accident, old 10/27/2015    Surgical History: Past Surgical History  Procedure Laterality Date  . Cholecystectomy      Home Medications:    Medication List       This list is accurate as of: 10/27/15  2:21 PM.  Always use your most recent med list.               aspirin EC 81 MG tablet  Take 81 mg by mouth daily.     atorvastatin 40 MG tablet  Commonly known as:  LIPITOR  Take 40 mg by mouth at bedtime.     glipiZIDE 5 MG 24 hr tablet  Commonly known as:  GLUCOTROL XL  Take 5 mg by mouth daily.     lisinopril 20 MG tablet  Commonly known as:  PRINIVIL,ZESTRIL  Take 20 mg by mouth daily.     metFORMIN 1000 MG tablet  Commonly known as:  GLUCOPHAGE  Take 1,000 mg by mouth 2 (two) times daily.     metoprolol succinate 25  MG 24 hr tablet  Commonly known as:  TOPROL-XL  Take 25 mg by mouth 2 (two) times daily.     vitamin C 500 MG tablet  Commonly known as:  ASCORBIC ACID  Take 500 mg by mouth daily.     vitamin E 400 UNIT capsule  Take 400 Units by mouth daily.     warfarin 5 MG tablet  Commonly known as:  COUMADIN  Take 5 mg by mouth daily.        Allergies:  Allergies  Allergen Reactions  . Darvon [Propoxyphene] Hives    Family History: Family History  Problem Relation Age of Onset  . Prostate cancer Neg Hx   . Bladder Cancer Neg Hx   . Kidney cancer Neg Hx   . Nephrolithiasis Brother     Social History:  reports that he has never smoked. He does not have any smokeless tobacco history on file. He reports that he does not drink alcohol or use illicit drugs.  ROS: UROLOGY Frequent Urination?: Yes Hard to postpone urination?: No Burning/pain with urination?: No Get up at night to urinate?: No Leakage of urine?: No Urine stream starts and stops?: No  Trouble starting stream?: No Do you have to strain to urinate?: No Blood in urine?: No Urinary tract infection?: No Sexually transmitted disease?: No Injury to kidneys or bladder?: No Painful intercourse?: No Weak stream?: No Erection problems?: No Penile pain?: No  Gastrointestinal Nausea?: No Vomiting?: No Indigestion/heartburn?: No Diarrhea?: No Constipation?: No  Constitutional Fever: No Night sweats?: No Weight loss?: No Fatigue?: No  Skin Skin rash/lesions?: No Itching?: No  Eyes Blurred vision?: No Double vision?: No  Ears/Nose/Throat Sore throat?: Yes Sinus problems?: Yes  Hematologic/Lymphatic Swollen glands?: No Easy bruising?: No  Cardiovascular Leg swelling?: No Chest pain?: No  Respiratory Cough?: No Shortness of breath?: No  Endocrine Excessive thirst?: No  Musculoskeletal Back pain?: Yes Joint pain?: Yes  Neurological Headaches?: No Dizziness?: Yes  Psychologic Depression?:  No Anxiety?: No  Physical Exam: BP 177/100 mmHg  Pulse 98  Ht  (1.88 m)  Wt 235 lb 6.4 oz (106.777 kg)  BMI 30.21 kg/m2  Constitutional:  Alert and oriented, No acute distress. HEENT: [place] AT, moist mucus membranes.  Trachea midline, no masses. Cardiovascular: No clubbing, cyanosis, or edema. Respiratory: Normal respiratory effort, no increased work of breathing. GI: Abdomen is soft, nontender, nondistended, no abdominal masses.   GU: No CVA tenderness.  Skin: No rashes, bruises or suspicious lesions. Lymph: No cervical or inguinal adenopathy. Neurologic: Grossly intact, no focal deficits, moving all 4 extremities. Psychiatric: Normal mood and affect.  Laboratory Data: Lab Results  Component Value Date   WBC 12.4* 10/27/2015   HGB 17.8 10/27/2015   HCT 51.4 10/27/2015   MCV 96.0 10/27/2015   PLT 155 10/27/2015    Lab Results  Component Value Date   CREATININE 1.25* 10/27/2015    Lab Results  Component Value Date   HGBA1C 8.3* 10/25/2014    Urinalysis    Component Value Date/Time   COLORURINE YELLOW 10/27/2015 0751   APPEARANCEUR CLEAR 10/27/2015 0751   LABSPEC 1.020 10/27/2015 0751   PHURINE 6.5 10/27/2015 0751   GLUCOSEU 500* 10/27/2015 0751   HGBUR 2+* 10/27/2015 0751   BILIRUBINUR NEGATIVE 10/27/2015 0751   KETONESUR 1+* 10/27/2015 0751   PROTEINUR 100* 10/27/2015 0751   NITRITE NEGATIVE 10/27/2015 0751   LEUKOCYTESUR NEGATIVE 10/27/2015 0751   Results for orders placed or performed in visit on 10/27/15  Microscopic Examination  Result Value Ref Range   WBC, UA None seen 0 -  5 /hpf   RBC, UA None seen 0 -  2 /hpf   Epithelial Cells (non renal) None seen 0 - 10 /hpf   Bacteria, UA None seen None seen/Few  Urinalysis, Complete  Result Value Ref Range   Specific Gravity, UA <1.005 (L) 1.005 - 1.030   pH, UA 6.0 5.0 - 7.5   Color, UA Yellow Yellow   Appearance Ur Clear Clear   Leukocytes, UA Negative Negative   Protein, UA Negative  Negative/Trace   Glucose, UA Trace (A) Negative   Ketones, UA Negative Negative   RBC, UA Trace (A) Negative   Bilirubin, UA Negative Negative   Urobilinogen, Ur 0.2 0.2 - 1.0 mg/dL   Nitrite, UA Negative Negative   Microscopic Examination See below:      Pertinent Imaging: CLINICAL DATA: 81 year old male with left-sided flank pain and left lower quadrant abdominal pain for the past 3 days. History of kidney stones.  EXAM: CT ABDOMEN AND PELVIS WITHOUT CONTRAST  TECHNIQUE: Multidetector CT imaging of the abdomen and pelvis was performed following the standard protocol without IV contrast.  COMPARISON: CT of the abdomen pelvis 05/07/2015.  FINDINGS: Lower chest: Areas of mild scarring are noted in the lung bases bilaterally. Faint calcifications of the aortic valve.  Hepatobiliary: No definite cystic or solid hepatic lesion is identified on today's noncontrast CT examination. Status post cholecystectomy.  Pancreas: No pancreatic mass or peripancreatic inflammatory changes identified on today's noncontrast CT examination.  Spleen: Unremarkable.  Adrenals/Urinary Tract: At the left ureterovesicular junction there is a 13 x 7 x 6 mm calculus. This is associated with mild proximal hydroureteronephrosis indicative of mild obstruction at this time. No additional calculi are identified within the collecting system of either kidney, along the course of the right ureter or within the lumen of the urinary bladder. There are multiple low-attenuation lesions in both kidneys, which are incompletely characterized on today's noncontrast CT examination, but appear similar to the prior study from 05/07/2015, presumably cysts. Unenhanced appearance of the urinary bladder is otherwise normal. Bilateral adrenal glands are normal in appearance.  Stomach/Bowel: Unenhanced appearance of the stomach is normal. No pathologic dilatation of small bowel or colon. Numerous  colonic diverticulae are noted, particularly in the distal descending colon and proximal sigmoid colon, without surrounding inflammatory changes to suggest an acute diverticulitis at this time.  Vascular/Lymphatic: Atherosclerosis throughout the abdominal and pelvic vasculature, without evidence of aneurysm. No definite lymphadenopathy noted in the abdomen or pelvis on today's noncontrast CT examination.  Reproductive: Prostate gland appears mildly enlarged measuring 6.0 x 6.6 x 6.9 cm and is heterogeneously calcified (nonspecific).  Other: No significant volume of ascites. No pneumoperitoneum.  Musculoskeletal: There are no aggressive appearing lytic or blastic lesions noted in the visualized portions of the skeleton.  IMPRESSION: 1. 13 x 7 x 6 mm calculus at the left ureterovesicular junction with mild proximal left hydroureteronephrosis indicative of mild obstruction at this time. 2. No additional urinary tract calculi. 3. Colonic diverticulosis without evidence of acute diverticulitis at this time. 4. Multiple low-attenuation renal lesions, similar to prior examinations. Although these are incompletely characterized on today's noncontrast CT examination, these are presumably cysts. 5. Additional findings, as above.   Electronically Signed  By: Trudie Reedaniel Entrikin M.D.  On: 10/27/2015 09:07  Assessment & Plan:   81 year old male with 13 mm left UVJ stone , hydronephrosis. No signs or symptoms of infection.  1. Nephrolithiasis  Given the size of the stone, I do feel it is unlikely that it'll pass spontaneously although not impossible based on the shape of the stone. We will start him on Flomax in hopes of spontaneous passage, although , recommend scheduling intervention in the interim .We discussed various treatment options including ESWL vs. ureteroscopy, laser lithotripsy, and stent. We discussed the risks and benefits of both including bleeding, infection, damage to  surrounding structures, efficacy with need for possible further intervention, and need for temporary ureteral stent.     He would like to proceed with left ureteroscopy and we will book him for early next week. He was advised to call or present to the emergency room if he develops any fevers, chills, or any other worrisome symptoms in the meantime and the importance of seeking immediate medical attention in the situation.   He is currently on anticoagulation and ideally, we could stop Coumadin for the procedure. We will touch base with his PCP in order to obtain clearance. If unable to stop, procedure could possibly be done on coumadin/ ASA  With slightly increased risk of bleeding or impaired visualization of which the patient is aware.  -  Urinalysis, Complete - CULTURE, URINE COMPREHENSIVE  2. Left ureteral calculus As above. - tamsulosin (FLOMAX) 0.4 MG CAPS capsule; Take 1 capsule (0.4 mg total) by mouth daily.  Dispense: 30 capsule; Refill: 0 - HYDROcodone-acetaminophen (NORCO/VICODIN) 5-325 MG tablet; Take 1-2 tablets by mouth every 6 (six) hours as needed for moderate pain.  Dispense: 10 tablet; Refill: 0  3. Hydronephrosis, left As above   Schedule surgery.   Vanna Scotland, MD  [facility] [address], [address] [place], [place] [address] [phone]

## 2015-11-02 NOTE — Transfer of Care (Signed)
Immediate Anesthesia Transfer of Care Note  Patient: Gabriel Hamshomas P Fitzhenry Jr.  Procedure(s) Performed: Procedure(s): URETEROSCOPY WITH HOLMIUM LASER LITHOTRIPSY (Left) CYSTOSCOPY WITH STENT PLACEMENT (Left)  Patient Location: PACU  Anesthesia Type:General  Level of Consciousness: awake and oriented  Airway & Oxygen Therapy: Patient Spontanous Breathing and Patient connected to face mask oxygen  Post-op Assessment: Report given to RN and Post -op Vital signs reviewed and stable  Post vital signs: Reviewed  Last Vitals:  Filed Vitals:   11/02/15 0800 11/02/15 1017  BP: 193/108 159/87  Pulse: 94   Temp: 36.6 C 36.4 C  Resp: 16     Complications: No apparent anesthesia complications

## 2015-11-09 LAB — STONE ANALYSIS
CA PHOS CRY STONE QL IR: 3 %
Ca Oxalate,Dihydrate: 20 %
Ca Oxalate,Monohydr.: 77 %
Stone Weight KSTONE: 34 mg

## 2015-11-16 ENCOUNTER — Ambulatory Visit (INDEPENDENT_AMBULATORY_CARE_PROVIDER_SITE_OTHER): Payer: Medicare PPO | Admitting: Urology

## 2015-11-16 ENCOUNTER — Encounter: Payer: Self-pay | Admitting: Urology

## 2015-11-16 VITALS — BP 190/97 | HR 101 | Ht 74.0 in | Wt 238.0 lb

## 2015-11-16 DIAGNOSIS — N201 Calculus of ureter: Secondary | ICD-10-CM

## 2015-11-16 DIAGNOSIS — N1330 Unspecified hydronephrosis: Secondary | ICD-10-CM

## 2015-11-16 DIAGNOSIS — N200 Calculus of kidney: Secondary | ICD-10-CM

## 2015-11-16 LAB — URINALYSIS, COMPLETE
BILIRUBIN UA: NEGATIVE
Ketones, UA: NEGATIVE
Leukocytes, UA: NEGATIVE
NITRITE UA: NEGATIVE
PH UA: 6.5 (ref 5.0–7.5)
PROTEIN UA: NEGATIVE
Specific Gravity, UA: 1.01 (ref 1.005–1.030)
UUROB: 0.2 mg/dL (ref 0.2–1.0)

## 2015-11-16 LAB — MICROSCOPIC EXAMINATION
Epithelial Cells (non renal): NONE SEEN /hpf (ref 0–10)
RBC, UA: >30 /hpf — ABNORMAL HIGH (ref 0–?)
WBC UA: NONE SEEN /HPF (ref 0–?)

## 2015-11-16 MED ORDER — CIPROFLOXACIN HCL 500 MG PO TABS
500.0000 mg | ORAL_TABLET | Freq: Once | ORAL | Status: AC
  Administered 2015-11-16: 500 mg via ORAL

## 2015-11-16 MED ORDER — LIDOCAINE HCL 2 % EX GEL
1.0000 "application " | Freq: Once | CUTANEOUS | Status: AC
  Administered 2015-11-16: 1 via URETHRAL

## 2015-11-16 NOTE — Progress Notes (Signed)
4:06 PM  11/16/2015   Gregor Hams. 1932-04-27 [id number]  Referring provider: Dione Housekeeper, MD [address] [place], [place] [address]  Chief Complaint  Patient presents with  . Routine Post Op    ureteroscopy    HPI: 81 yo M with13 mm left UVJ stone with proximal hydroureteronephrosis.  He was taken to the OR on 11/02/15 for left ureteroscopy, laser lithotripsy, left ureteral stent placement. He returns to the office today for cystoscopy, stent removal.  He does have a history of kidney stones 1 year ago and passed the stone spontaneously with Flomax.    No complaints today.  He is not terribly bothered by the stent.    PMH: Past Medical History  Diagnosis Date  . Hypertension   . Diabetes mellitus without complication (HCC)     Metformin  . Nephrolithiasis   . Cerebral arterial thrombosis 08/09/2011  . Essential (primary) hypertension 09/25/2014  . Type 2 diabetes mellitus (HCC) 09/25/2014  . HLD (hyperlipidemia) 10/27/2015  . Pure hypercholesterolemia 09/25/2014  . Cerebrovascular accident, old 10/27/2015    Surgical History: Past Surgical History  Procedure Laterality Date  . Cholecystectomy    . Tonsillectomy    . Ureteroscopy with holmium laser lithotripsy Left 11/02/2015    Procedure: URETEROSCOPY WITH HOLMIUM LASER LITHOTRIPSY;  Surgeon: Vanna Scotland, MD;  Location: [facility] ORS;  Service: Urology;  Laterality: Left;  . Cystoscopy with stent placement Left 11/02/2015    Procedure: CYSTOSCOPY WITH STENT PLACEMENT;  Surgeon: Vanna Scotland, MD;  Location: [facility] OR[facility];  Service: Urology;  Laterality: Left;    Home Medications:    Medication List       This list is accurate as of: 11/16/15  4:06 PM.  Always use your most recent med list.               acetaminophen 500 MG tablet  Commonly known as:  TYLENOL  Take 500 mg by mouth every 6 (six) hours as needed.     aspirin EC 81 MG tablet  Take 81 mg by mouth daily.     atorvastatin 40  MG tablet  Commonly known as:  LIPITOR  Take 40 mg by mouth at bedtime.     glipiZIDE 5 MG 24 hr tablet  Commonly known as:  GLUCOTROL XL  Take 5 mg by mouth daily.     JANUVIA 100 MG tablet  Generic drug:  sitaGLIPtin     lisinopril 5 MG tablet  Commonly known as:  PRINIVIL,ZESTRIL  TAKE 3 TABLETS IN THE MORNING, 1 TABLET IN THE EVENING AND 1 TABLET AT BEDTIME     metFORMIN 1000 MG tablet  Commonly known as:  GLUCOPHAGE  Take 1,000 mg by mouth 2 (two) times daily.     metoprolol succinate 25 MG 24 hr tablet  Commonly known as:  TOPROL-XL  Take 25 mg by mouth 2 (two) times daily.     oxybutynin 5 MG tablet  Commonly known as:  DITROPAN  Take 1 tablet (5 mg total) by mouth every 8 (eight) hours as needed for bladder spasms.     tamsulosin 0.4 MG Caps capsule  Commonly known as:  FLOMAX  Take 1 capsule (0.4 mg total) by mouth daily.     vitamin C 500 MG tablet  Commonly known as:  ASCORBIC ACID  Take 500 mg by mouth daily.     vitamin E 400 UNIT capsule  Take 400 Units by mouth daily.  warfarin 5 MG tablet  Commonly known as:  COUMADIN  Take 5 mg by mouth daily.        Allergies:  Allergies  Allergen Reactions  . Darvon [Propoxyphene] Hives    Family History: Family History  Problem Relation Age of Onset  . Prostate cancer Neg Hx   . Bladder Cancer Neg Hx   . Kidney cancer Neg Hx   . Nephrolithiasis Brother     Social History:  reports that he has never smoked. He has never used smokeless tobacco. He reports that he does not drink alcohol or use illicit drugs.  Physical Exam: BP 190/97 mmHg  Pulse 101  Ht  (1.88 m)  Wt 238 lb (107.956 kg)  BMI 30.54 kg/m2  Constitutional:  Alert and oriented, No acute distress. HEENT: [place] AT, moist mucus membranes.  Trachea midline, no masses. Cardiovascular: No clubbing, cyanosis, or edema. Respiratory: Normal respiratory effort, no increased work of breathing. GI: Abdomen is soft, nontender, nondistended,  no abdominal masses.   GU: No CVA tenderness. Normal phallus with a patent urethral meatus. Skin: No rashes, bruises or suspicious lesions. Neurologic: Grossly intact, no focal deficits, moving all 4 extremities. Psychiatric: Normal mood and affect.  Laboratory Data: Lab Results  Component Value Date   WBC 12.4* 10/27/2015   HGB 17.8 10/27/2015   HCT 51.4 10/27/2015   MCV 96.0 10/27/2015   PLT 155 10/27/2015    Lab Results  Component Value Date   CREATININE 1.25* 10/27/2015    Lab Results  Component Value Date   HGBA1C 8.3* 10/25/2014    Urinalysis UA with 3+ glucose, 3+ blood, otherwise negative.  TNTC RBC, no WBC, many bacteria.    Pertinent Imaging: CLINICAL DATA: 81 year old male with left-sided flank pain and left lower quadrant abdominal pain for the past 3 days. History of kidney stones.  EXAM: CT ABDOMEN AND PELVIS WITHOUT CONTRAST  TECHNIQUE: Multidetector CT imaging of the abdomen and pelvis was performed following the standard protocol without IV contrast.  COMPARISON: CT of the abdomen pelvis 05/07/2015.  FINDINGS: Lower chest: Areas of mild scarring are noted in the lung bases bilaterally. Faint calcifications of the aortic valve.  Hepatobiliary: No definite cystic or solid hepatic lesion is identified on today's noncontrast CT examination. Status post cholecystectomy.  Pancreas: No pancreatic mass or peripancreatic inflammatory changes identified on today's noncontrast CT examination.  Spleen: Unremarkable.  Adrenals/Urinary Tract: At the left ureterovesicular junction there is a 13 x 7 x 6 mm calculus. This is associated with mild proximal hydroureteronephrosis indicative of mild obstruction at this time. No additional calculi are identified within the collecting system of either kidney, along the course of the right ureter or within the lumen of the urinary bladder. There are multiple low-attenuation lesions in both kidneys,  which are incompletely characterized on today's noncontrast CT examination, but appear similar to the prior study from 05/07/2015, presumably cysts. Unenhanced appearance of the urinary bladder is otherwise normal. Bilateral adrenal glands are normal in appearance.  Stomach/Bowel: Unenhanced appearance of the stomach is normal. No pathologic dilatation of small bowel or colon. Numerous colonic diverticulae are noted, particularly in the distal descending colon and proximal sigmoid colon, without surrounding inflammatory changes to suggest an acute diverticulitis at this time.  Vascular/Lymphatic: Atherosclerosis throughout the abdominal and pelvic vasculature, without evidence of aneurysm. No definite lymphadenopathy noted in the abdomen or pelvis on today's noncontrast CT examination.  Reproductive: Prostate gland appears mildly enlarged measuring 6.0 x 6.6 x 6.9 cm  and is heterogeneously calcified (nonspecific).  Other: No significant volume of ascites. No pneumoperitoneum.  Musculoskeletal: There are no aggressive appearing lytic or blastic lesions noted in the visualized portions of the skeleton.  IMPRESSION: 1. 13 x 7 x 6 mm calculus at the left ureterovesicular junction with mild proximal left hydroureteronephrosis indicative of mild obstruction at this time. 2. No additional urinary tract calculi. 3. Colonic diverticulosis without evidence of acute diverticulitis at this time. 4. Multiple low-attenuation renal lesions, similar to prior examinations. Although these are incompletely characterized on today's noncontrast CT examination, these are presumably cysts. 5. Additional findings, as above.   Electronically Signed  By: Trudie Reedaniel Entrikin M.D.  On: 10/27/2015 09:07  Cystoscopy/ Stent removal procedure  Patient identification was confirmed, informed consent was obtained, and patient was prepped using Betadine solution.  Lidocaine jelly was administered per  urethral meatus.    Preoperative abx where received prior to procedure.    Procedure: - Flexible cystoscope introduced, without any difficulty.   - Thorough search of the bladder revealed:    normal urethral meatus  Stent seen emanating from left ureteral orifice, grasped with stent graspers, and removed in entirety.    Post-Procedure: - Patient tolerated the procedure well  Assessment & Plan:   81 year old male with 13 mm left UVJ stone , hydronephrosis s/p L URS, LL, stent here for stent removal, uncomplicated.  Stone analysis mostly CaOX.    1. Left ureteral calculus S/p L URS, LL, stent Stent removed today without complication RTC 4 week with RUS prior Will discuss stone prevention at next visit - US Renal; Future  2. Nephrolithiasis As above - Urinalysis, Complete - ciprofloxacin (CIPRO) tablet 500 mg; Take 1 tablet (500 mg total) by mouth once. - lidocaine (XYLOCAINE) 2 % jelly 1 application; Place 1 application into the urethra once.  3. Hydronephrosis, left Assess for resolution with RUS in 4 weeks  F/u in 4 weeks with RUS prior     Vanna ScotlandAshley Hser Belanger, MD  [facility] [address], [address] [place], [place] [address] [phone]

## 2015-12-03 ENCOUNTER — Other Ambulatory Visit: Payer: Self-pay

## 2015-12-03 DIAGNOSIS — N201 Calculus of ureter: Secondary | ICD-10-CM

## 2015-12-03 MED ORDER — TAMSULOSIN HCL 0.4 MG PO CAPS: 0.4000 mg | ORAL_CAPSULE | Freq: Every day | ORAL | Status: AC

## 2015-12-18 ENCOUNTER — Encounter: Payer: Medicare PPO | Admitting: Urology

## 2015-12-18 ENCOUNTER — Encounter: Payer: Self-pay | Admitting: Urology

## 2015-12-21 NOTE — Progress Notes (Signed)
Patient was a no show.   This encounter was created in error - please disregard. 

## 2015-12-28 ENCOUNTER — Telehealth: Payer: Self-pay | Admitting: Radiology

## 2015-12-28 NOTE — Telephone Encounter (Signed)
LMOM for pt to return call.   Vanna ScotlandAshley Brandon, MD  [facility]           Please contact this patient, have him at least get his ultrasound. He was a no show for RUS and for clinic.    Vanna ScotlandAshley Brandon, MD

## 2015-12-30 NOTE — Telephone Encounter (Signed)
LMOM for pt to return call. 

## 2016-01-07 ENCOUNTER — Encounter: Payer: Self-pay | Admitting: Radiology

## 2021-07-05 ENCOUNTER — Encounter: Payer: Self-pay | Admitting: Radiology

## 2021-07-05 ENCOUNTER — Emergency Department: Payer: No Typology Code available for payment source

## 2021-07-05 ENCOUNTER — Inpatient Hospital Stay
Admission: EM | Admit: 2021-07-05 | Discharge: 2021-07-15 | DRG: 871 | Disposition: A | Discharge status: Skilled Nursing Facility | Payer: No Typology Code available for payment source | Attending: Internal Medicine | Admitting: Internal Medicine

## 2021-07-05 DIAGNOSIS — E870 Hyperosmolality and hypernatremia: Secondary | ICD-10-CM | POA: Diagnosis not present

## 2021-07-05 DIAGNOSIS — G9341 Metabolic encephalopathy: Secondary | ICD-10-CM

## 2021-07-05 DIAGNOSIS — J9600 Acute respiratory failure, unspecified whether with hypoxia or hypercapnia: Secondary | ICD-10-CM | POA: Diagnosis present

## 2021-07-05 DIAGNOSIS — I10 Essential (primary) hypertension: Secondary | ICD-10-CM | POA: Diagnosis present

## 2021-07-05 DIAGNOSIS — M6282 Rhabdomyolysis: Secondary | ICD-10-CM | POA: Diagnosis present

## 2021-07-05 DIAGNOSIS — E7800 Pure hypercholesterolemia, unspecified: Secondary | ICD-10-CM | POA: Diagnosis present

## 2021-07-05 DIAGNOSIS — Z20822 Contact with and (suspected) exposure to covid-19: Secondary | ICD-10-CM | POA: Diagnosis present

## 2021-07-05 DIAGNOSIS — W1789XA Other fall from one level to another, initial encounter: Secondary | ICD-10-CM | POA: Diagnosis present

## 2021-07-05 DIAGNOSIS — N179 Acute kidney failure, unspecified: Secondary | ICD-10-CM | POA: Diagnosis present

## 2021-07-05 DIAGNOSIS — Z7901 Long term (current) use of anticoagulants: Secondary | ICD-10-CM

## 2021-07-05 DIAGNOSIS — Z7982 Long term (current) use of aspirin: Secondary | ICD-10-CM

## 2021-07-05 DIAGNOSIS — Z885 Allergy status to narcotic agent status: Secondary | ICD-10-CM

## 2021-07-05 DIAGNOSIS — Z79899 Other long term (current) drug therapy: Secondary | ICD-10-CM | POA: Diagnosis not present

## 2021-07-05 DIAGNOSIS — E1165 Type 2 diabetes mellitus with hyperglycemia: Secondary | ICD-10-CM | POA: Diagnosis not present

## 2021-07-05 DIAGNOSIS — J9601 Acute respiratory failure with hypoxia: Secondary | ICD-10-CM | POA: Diagnosis present

## 2021-07-05 DIAGNOSIS — E872 Acidosis: Secondary | ICD-10-CM | POA: Diagnosis present

## 2021-07-05 DIAGNOSIS — J690 Pneumonitis due to inhalation of food and vomit: Secondary | ICD-10-CM | POA: Diagnosis present

## 2021-07-05 DIAGNOSIS — I248 Other forms of acute ischemic heart disease: Secondary | ICD-10-CM | POA: Diagnosis present

## 2021-07-05 DIAGNOSIS — R748 Abnormal levels of other serum enzymes: Secondary | ICD-10-CM | POA: Diagnosis present

## 2021-07-05 DIAGNOSIS — E119 Type 2 diabetes mellitus without complications: Secondary | ICD-10-CM | POA: Diagnosis present

## 2021-07-05 DIAGNOSIS — Z7984 Long term (current) use of oral hypoglycemic drugs: Secondary | ICD-10-CM | POA: Diagnosis not present

## 2021-07-05 DIAGNOSIS — IMO0002 Reserved for concepts with insufficient information to code with codable children: Secondary | ICD-10-CM

## 2021-07-05 DIAGNOSIS — R1310 Dysphagia, unspecified: Secondary | ICD-10-CM | POA: Diagnosis not present

## 2021-07-05 DIAGNOSIS — L89159 Pressure ulcer of sacral region, unspecified stage: Secondary | ICD-10-CM

## 2021-07-05 DIAGNOSIS — A419 Sepsis, unspecified organism: Principal | ICD-10-CM | POA: Diagnosis present

## 2021-07-05 DIAGNOSIS — J90 Pleural effusion, not elsewhere classified: Secondary | ICD-10-CM | POA: Diagnosis not present

## 2021-07-05 DIAGNOSIS — Z515 Encounter for palliative care: Secondary | ICD-10-CM | POA: Diagnosis not present

## 2021-07-05 DIAGNOSIS — I452 Bifascicular block: Secondary | ICD-10-CM | POA: Diagnosis present

## 2021-07-05 DIAGNOSIS — E876 Hypokalemia: Secondary | ICD-10-CM | POA: Diagnosis not present

## 2021-07-05 DIAGNOSIS — Z9911 Dependence on respirator [ventilator] status: Secondary | ICD-10-CM

## 2021-07-05 DIAGNOSIS — R4182 Altered mental status, unspecified: Secondary | ICD-10-CM | POA: Diagnosis present

## 2021-07-05 DIAGNOSIS — L081 Erythrasma: Secondary | ICD-10-CM | POA: Diagnosis present

## 2021-07-05 DIAGNOSIS — Z8673 Personal history of transient ischemic attack (TIA), and cerebral infarction without residual deficits: Secondary | ICD-10-CM | POA: Diagnosis not present

## 2021-07-05 DIAGNOSIS — R778 Other specified abnormalities of plasma proteins: Secondary | ICD-10-CM

## 2021-07-05 DIAGNOSIS — R0602 Shortness of breath: Secondary | ICD-10-CM

## 2021-07-05 DIAGNOSIS — I429 Cardiomyopathy, unspecified: Secondary | ICD-10-CM | POA: Diagnosis present

## 2021-07-05 DIAGNOSIS — I9581 Postprocedural hypotension: Secondary | ICD-10-CM | POA: Diagnosis not present

## 2021-07-05 DIAGNOSIS — Z978 Presence of other specified devices: Secondary | ICD-10-CM

## 2021-07-05 DIAGNOSIS — R7989 Other specified abnormal findings of blood chemistry: Secondary | ICD-10-CM

## 2021-07-05 DIAGNOSIS — E875 Hyperkalemia: Secondary | ICD-10-CM | POA: Diagnosis present

## 2021-07-05 DIAGNOSIS — E8770 Fluid overload, unspecified: Secondary | ICD-10-CM | POA: Diagnosis not present

## 2021-07-05 DIAGNOSIS — Z7189 Other specified counseling: Secondary | ICD-10-CM | POA: Diagnosis not present

## 2021-07-05 LAB — URINALYSIS, COMPLETE (UACMP) WITH MICROSCOPIC
Bilirubin Urine: NEGATIVE
Glucose, UA: >=500 mg/dL — AB
Hgb urine dipstick: NEGATIVE
Ketones, ur: NEGATIVE mg/dL
Leukocytes,Ua: NEGATIVE
Nitrite: NEGATIVE
Protein, ur: 30 mg/dL — AB
Specific Gravity, Urine: 1.018 (ref 1.005–1.030)
pH: 5 (ref 5.0–8.0)

## 2021-07-05 LAB — COMPREHENSIVE METABOLIC PANEL
ALT: 43 U/L (ref 0–44)
AST: 78 U/L — ABNORMAL HIGH (ref 15–41)
Albumin: 3 g/dL — ABNORMAL LOW (ref 3.5–5.0)
Alkaline Phosphatase: 46 U/L (ref 38–126)
Anion gap: 14 (ref 5–15)
BUN: 133 mg/dL — ABNORMAL HIGH (ref 8–23)
CO2: 25 mmol/L (ref 22–32)
Calcium: 9.7 mg/dL (ref 8.9–10.3)
Chloride: 101 mmol/L (ref 98–111)
Creatinine, Ser: 5.1 mg/dL — ABNORMAL HIGH (ref 0.61–1.24)
GFR, Estimated: 10 mL/min — ABNORMAL LOW (ref >60)
Glucose, Bld: 472 mg/dL — ABNORMAL HIGH (ref 70–99)
Potassium: 5.1 mmol/L (ref 3.5–5.1)
Sodium: 140 mmol/L (ref 135–145)
Total Bilirubin: 1.8 mg/dL — ABNORMAL HIGH (ref 0.3–1.2)
Total Protein: 6.6 g/dL (ref 6.5–8.1)

## 2021-07-05 LAB — RESP PANEL BY RT-PCR (FLU A&B, COVID) ARPGX2
Influenza A by PCR: NEGATIVE
Influenza B by PCR: NEGATIVE
SARS Coronavirus 2 by RT PCR: NEGATIVE

## 2021-07-05 LAB — TROPONIN I (HIGH SENSITIVITY)
Troponin I (High Sensitivity): 201 ng/L (ref <18)
Troponin I (High Sensitivity): 229 ng/L (ref <18)

## 2021-07-05 LAB — CBC WITH DIFFERENTIAL/PLATELET
Abs Immature Granulocytes: 0.07 10*3/uL (ref 0.00–0.07)
Basophils Absolute: 0 10*3/uL (ref 0.0–0.1)
Basophils Relative: 0 %
Eosinophils Absolute: 0 10*3/uL (ref 0.0–0.5)
Eosinophils Relative: 0 %
HCT: 52 % (ref 39.0–52.0)
Hemoglobin: 17.6 g/dL — ABNORMAL HIGH (ref 13.0–17.0)
Immature Granulocytes: 1 %
Lymphocytes Relative: 5 %
Lymphs Abs: 0.6 10*3/uL — ABNORMAL LOW (ref 0.7–4.0)
MCH: 34.4 pg — ABNORMAL HIGH (ref 26.0–34.0)
MCHC: 33.8 g/dL (ref 30.0–36.0)
MCV: 101.8 fL — ABNORMAL HIGH (ref 80.0–100.0)
Monocytes Absolute: 1.9 10*3/uL — ABNORMAL HIGH (ref 0.1–1.0)
Monocytes Relative: 14 %
Neutro Abs: 11.1 10*3/uL — ABNORMAL HIGH (ref 1.7–7.7)
Neutrophils Relative %: 80 %
Platelets: 278 10*3/uL (ref 150–400)
RBC: 5.11 MIL/uL (ref 4.22–5.81)
RDW: 13.3 % (ref 11.5–15.5)
WBC: 13.7 10*3/uL — ABNORMAL HIGH (ref 4.0–10.5)
nRBC: 0 % (ref 0.0–0.2)

## 2021-07-05 LAB — LACTIC ACID, PLASMA
Lactic Acid, Venous: 2.8 mmol/L (ref 0.5–1.9)
Lactic Acid, Venous: 3.6 mmol/L (ref 0.5–1.9)

## 2021-07-05 LAB — BLOOD GAS, VENOUS
Acid-base deficit: 0.1 mmol/L (ref 0.0–2.0)
Bicarbonate: 27.3 mmol/L (ref 20.0–28.0)
FIO2: 100
MECHVT: 500 mL
O2 Saturation: 97 %
PEEP: 5 cmH2O
Patient temperature: 37
RATE: 24 resp/min
pCO2, Ven: 53 mmHg (ref 44.0–60.0)
pH, Ven: 7.32 (ref 7.250–7.430)
pO2, Ven: 98 mmHg — ABNORMAL HIGH (ref 32.0–45.0)

## 2021-07-05 LAB — CBC
HCT: 49.3 % (ref 39.0–52.0)
Hemoglobin: 16.3 g/dL (ref 13.0–17.0)
MCH: 34.2 pg — ABNORMAL HIGH (ref 26.0–34.0)
MCHC: 33.1 g/dL (ref 30.0–36.0)
MCV: 103.4 fL — ABNORMAL HIGH (ref 80.0–100.0)
Platelets: 213 10*3/uL (ref 150–400)
RBC: 4.77 MIL/uL (ref 4.22–5.81)
RDW: 13.2 % (ref 11.5–15.5)
WBC: 15.1 10*3/uL — ABNORMAL HIGH (ref 4.0–10.5)
nRBC: 0 % (ref 0.0–0.2)

## 2021-07-05 LAB — CREATININE, SERUM
Creatinine, Ser: 4.38 mg/dL — ABNORMAL HIGH (ref 0.61–1.24)
GFR, Estimated: 12 mL/min — ABNORMAL LOW (ref >60)

## 2021-07-05 LAB — APTT: aPTT: 28 seconds (ref 24–36)

## 2021-07-05 LAB — CK: Total CK: 3225 U/L — ABNORMAL HIGH (ref 49–397)

## 2021-07-05 LAB — PROTIME-INR
INR: 2.2 — ABNORMAL HIGH (ref 0.8–1.2)
Prothrombin Time: 24 seconds — ABNORMAL HIGH (ref 11.4–15.2)

## 2021-07-05 MED ORDER — MIDAZOLAM HCL 2 MG/2ML IJ SOLN: 1.0000 mg | INTRAMUSCULAR | Status: DC | PRN

## 2021-07-05 MED ORDER — HEPARIN SODIUM (PORCINE) 5000 UNIT/ML IJ SOLN
5000.0000 [IU] | Freq: Three times a day (TID) | INTRAMUSCULAR | Status: DC
  Administered 2021-07-05: 5000 [IU] via SUBCUTANEOUS
  Filled 2021-07-05: qty 1

## 2021-07-05 MED ORDER — POLYETHYLENE GLYCOL 3350 17 G PO PACK: 17.0000 g | PACK | Freq: Every day | ORAL | Status: DC

## 2021-07-05 MED ORDER — VANCOMYCIN HCL 2000 MG/400ML IV SOLN
2000.0000 mg | Freq: Once | INTRAVENOUS | Status: AC
  Administered 2021-07-05: 2000 mg via INTRAVENOUS
  Filled 2021-07-05: qty 400

## 2021-07-05 MED ORDER — DOCUSATE SODIUM 100 MG PO CAPS: 100.0000 mg | ORAL_CAPSULE | Freq: Two times a day (BID) | ORAL | Status: DC | PRN

## 2021-07-05 MED ORDER — DOCUSATE SODIUM 50 MG/5ML PO LIQD
100.0000 mg | Freq: Two times a day (BID) | ORAL | Status: DC
  Administered 2021-07-06 – 2021-07-07 (×2): 100 mg
  Filled 2021-07-05 (×4): qty 10

## 2021-07-05 MED ORDER — LACTATED RINGERS IV BOLUS (SEPSIS)
1000.0000 mL | Freq: Once | INTRAVENOUS | Status: AC
  Administered 2021-07-05: 1000 mL via INTRAVENOUS

## 2021-07-05 MED ORDER — LACTATED RINGERS IV BOLUS (SEPSIS)
500.0000 mL | Freq: Once | INTRAVENOUS | Status: AC
  Administered 2021-07-05: 500 mL via INTRAVENOUS

## 2021-07-05 MED ORDER — POLYETHYLENE GLYCOL 3350 17 G PO PACK: 17.0000 g | PACK | Freq: Every day | ORAL | Status: DC | PRN

## 2021-07-05 MED ORDER — SODIUM CHLORIDE 0.9 % IV SOLN
2.0000 g | Freq: Once | INTRAVENOUS | Status: AC
  Administered 2021-07-05: 2 g via INTRAVENOUS
  Filled 2021-07-05: qty 2

## 2021-07-05 MED ORDER — MIDAZOLAM HCL 2 MG/2ML IJ SOLN
1.0000 mg | INTRAMUSCULAR | Status: DC | PRN
Start: 2021-07-05 — End: 2021-07-07

## 2021-07-05 MED ORDER — METRONIDAZOLE 500 MG/100ML IV SOLN
500.0000 mg | Freq: Once | INTRAVENOUS | Status: AC
  Administered 2021-07-05: 500 mg via INTRAVENOUS
  Filled 2021-07-05: qty 100

## 2021-07-05 MED ORDER — PANTOPRAZOLE SODIUM 40 MG IV SOLR
40.0000 mg | Freq: Every day | INTRAVENOUS | Status: DC
Start: 2021-07-05 — End: 2021-07-07
  Administered 2021-07-05 – 2021-07-06 (×2): 40 mg via INTRAVENOUS
  Filled 2021-07-05 (×2): qty 40

## 2021-07-05 MED ORDER — LACTATED RINGERS IV SOLN: INTRAVENOUS | Status: DC

## 2021-07-05 MED ORDER — FENTANYL CITRATE (PF) 100 MCG/2ML IJ SOLN
25.0000 ug | INTRAMUSCULAR | Status: DC | PRN
  Filled 2021-07-05: qty 2

## 2021-07-05 MED ORDER — FENTANYL CITRATE (PF) 100 MCG/2ML IJ SOLN
25.0000 ug | INTRAMUSCULAR | Status: AC | PRN
  Administered 2021-07-06 (×3): 25 ug via INTRAVENOUS
  Filled 2021-07-05 (×2): qty 2

## 2021-07-05 MED ORDER — SODIUM CHLORIDE 0.9 % IV BOLUS
1000.0000 mL | Freq: Once | INTRAVENOUS | Status: AC
  Administered 2021-07-05: 1000 mL via INTRAVENOUS

## 2021-07-05 MED ORDER — VANCOMYCIN HCL IN DEXTROSE 1-5 GM/200ML-% IV SOLN: 1000.0000 mg | Freq: Once | INTRAVENOUS | Status: DC

## 2021-07-05 NOTE — ED Notes (Addendum)
Pt intubated by Dr. Derrill Kay @ 2014 secured at the lips @ 25. 30mg  of Rocuronium given @ 2012 by 2013, RN 30mg  of etomidate given @ 2013 by , RN  18Fr OG tube secured at 63 @ the lips

## 2021-07-05 NOTE — ED Provider Notes (Signed)
[facility] Emergency Department Provider Note  ____________________________________________   I have reviewed the triage vital signs and the nursing notes.   HISTORY  Chief Complaint Altered Mental Status   History limited by and level 5 caveat due to: Unresponsiveness  HPI Gabriel [name]. is a 87 y.o. male who presents to the emergency department today after being found on the ground and unresponsive. Per report the police were called to the patient's house for a welfare check. It is thought that a neighbor called and that no one has seen him for roughly 4 days. When police arrived they found patient down on the ground in the kitchen. Patient was covered with feces and insects.   Records reviewed. Per medical record review patient has a history of CVA, DM, HLD, HTN  Past Medical History:  Diagnosis Date  . Cerebral arterial thrombosis 08/09/2011  . Cerebrovascular accident, old 10/27/2015  . Diabetes mellitus without complication (HCC)    Metformin  . Essential (primary) hypertension 09/25/2014  . HLD (hyperlipidemia) 10/27/2015  . Hypertension   . Nephrolithiasis   . Pure hypercholesterolemia 09/25/2014  . Type 2 diabetes mellitus (HCC) 09/25/2014    Patient Active Problem List   Diagnosis Date Noted  . HLD (hyperlipidemia) 10/27/2015  . Cerebrovascular accident, old 10/27/2015  . Essential (primary) hypertension 09/25/2014  . Pure hypercholesterolemia 09/25/2014  . Type 2 diabetes mellitus (HCC) 09/25/2014  . Cerebral arterial thrombosis 08/09/2011  . Long term current use of anticoagulant 08/09/2011    Past Surgical History:  Procedure Laterality Date  . CHOLECYSTECTOMY    . CYSTOSCOPY WITH STENT PLACEMENT Left 11/02/2015   Procedure: CYSTOSCOPY WITH STENT PLACEMENT;  Surgeon: Vanna Scotland, MD;  Location: [facility] ORS;  Service: Urology;  Laterality: Left;  . TONSILLECTOMY    . URETEROSCOPY WITH HOLMIUM LASER LITHOTRIPSY Left 11/02/2015    Procedure: URETEROSCOPY WITH HOLMIUM LASER LITHOTRIPSY;  Surgeon: Vanna Scotland, MD;  Location: [facility] ORS;  Service: Urology;  Laterality: Left;    Prior to Admission medications   Medication Sig Start Date End Date Taking? Authorizing Provider  acetaminophen (TYLENOL) 500 MG tablet Take 500 mg by mouth every 6 (six) hours as needed.    [provider]  aspirin EC 81 MG tablet Take 81 mg by mouth daily.    [provider]  atorvastatin (LIPITOR) 40 MG tablet Take 40 mg by mouth at bedtime.    [provider]  glipiZIDE (GLUCOTROL XL) 5 MG 24 hr tablet Take 5 mg by mouth daily.    [provider]  JANUVIA 100 MG tablet  11/09/15   [provider]  lisinopril (PRINIVIL,ZESTRIL) 5 MG tablet TAKE 3 TABLETS IN THE MORNING, 1 TABLET IN THE EVENING AND 1 TABLET AT BEDTIME 11/09/15   [provider]  metFORMIN (GLUCOPHAGE) 1000 MG tablet Take 1,000 mg by mouth 2 (two) times daily.    [provider]  metoprolol succinate (TOPROL-XL) 25 MG 24 hr tablet Take 25 mg by mouth 2 (two) times daily.    [provider]  oxybutynin (DITROPAN) 5 MG tablet Take 1 tablet (5 mg total) by mouth every 8 (eight) hours as needed for bladder spasms. Patient not taking: Reported on 11/16/2015 11/02/15   Vanna Scotland, MD  tamsulosin (FLOMAX) 0.4 MG CAPS capsule Take 1 capsule (0.4 mg total) by mouth daily. 12/03/15   Vanna Scotland, MD  vitamin C (ASCORBIC ACID) 500 MG tablet Take 500 mg by mouth daily.    [provider]  vitamin E 400 UNIT capsule Take 400 Units by mouth daily.    [provider]  warfarin (COUMADIN) 5 MG tablet Take 5 mg by mouth daily.    [provider]    Allergies Darvon [propoxyphene]  Family History  Problem Relation Age of Onset  . Prostate cancer Neg Hx   . Bladder Cancer Neg Hx   . Kidney cancer Neg Hx   . Nephrolithiasis Brother     Social History Social History   Tobacco Use   . Smoking status: Never  . Smokeless tobacco: Never  Substance Use Topics  . Alcohol use: No    Alcohol/week: 0.0 standard drinks  . Drug use: No    Review of Systems Unable to obtain secondary to mental status.  ____________________________________________   PHYSICAL EXAM:  VITAL SIGNS: ED Triage Vitals  Enc Vitals Group     BP 07/05/21 2011 (!) 114/52     Pulse Rate 07/05/21 2011 (!) 125     Resp 07/05/21 2011 (!) 36     Temp 07/05/21 2034 100.2 F (37.9 C)     Temp Source 07/05/21 2034 Bladder     SpO2 07/05/21 2006 98 %     Weight 07/05/21 2011 240 lb 4.8 oz (109 kg)     Height 07/05/21 2011 6\' 2"  (1.88 m)    Constitutional: Responsive to only deep painful touch. Eyes: Conjunctivae are normal. Pupils pinpoint. ENT      Head: Normocephalic and atraumatic.      Nose: No congestion/rhinnorhea.      Mouth/Throat: Mucous membranes are dry.      Neck: No stridor. Hematological/Lymphatic/Immunilogical: No cervical lymphadenopathy. Cardiovascular: Tachycardia, abnormal rhythm Respiratory: Tachypnea Gastrointestinal: Soft and non tender. Genitourinary: Deferred Musculoskeletal: No lower extremity edema. Neurologic:  Minimally responsive to painful stimuli.  Skin:  Apparent fecal matter on skin. Warm.  ____________________________________________    LABS (pertinent positives/negatives)  CBC wbc 13.7, hgb 17.6, plt 278 VBG pH 7.32 Trop hs 201 CK 3225 CMP na 140, k 5.1, glu 472, cr 5.10 INR 2.2 UA hazy, >500 glucose, protein 30, rbc and wbc 0-5 COVID negative ____________________________________________   EKG  I, , attending physician, personally viewed and interpreted this EKG  EKG Time: 2009 Rate: 114 Rhythm: sinus tachycardia Axis: left axis deviation Intervals: qtc 465 QRS: RBBB, LAFB ST changes: no st elevation Impression: abnormal ekg ____________________________________________    RADIOLOGY  CT head/cervical spine No  acute abnormality    ____________________________________________   PROCEDURES  Procedures  INTUBATION Performed by: 2010  Required items: required blood products, implants, devices, and special equipment available Patient identity confirmed: provided demographic data and hospital-assigned identification number Time out: Immediately prior to procedure a "time out" was called to verify the correct patient, procedure, equipment, support staff and site/side marked as required.  Indications: unresponsiveness  Intubation method: Glidescope Laryngoscopy   Preoxygenation: BVM  Sedatives: Etomidate Paralytic: Rocuronium  Tube Size: 7.5 cuffed  Post-procedure assessment: chest rise and ETCO2 monitor Breath sounds: equal and absent over the epigastrium Tube secured with: ETT holder Chest x-ray interpreted by radiologist and me.  Chest x-ray findings: endotracheal tube in appropriate position  Patient tolerated the procedure well with no immediate complications.   CRITICAL CARE Performed by: Phineas Semen   Total critical care time: 35 minutes  Critical care time was exclusive of separately billable procedures and treating other patients.  Critical care was necessary to treat or prevent imminent or life-threatening deterioration.  Critical care was time spent personally by me on the following activities: development of treatment plan with patient and/or surrogate as well as nursing, discussions with consultants, evaluation of patient's response to treatment, examination of patient, obtaining history from patient or surrogate, ordering and performing treatments and interventions, ordering and review of laboratory studies, ordering and review of radiographic studies, pulse oximetry and re-evaluation of patient's condition.  ____________________________________________   INITIAL IMPRESSION / ASSESSMENT AND PLAN / ED COURSE  Pertinent labs & imaging results that  were available during my care of the patient were reviewed by me and considered in my medical decision making (see chart for details).   Patient presented to the emergency department today because of concerns for altered mental status. Patient was found unresponsive in his house after the police were called for a wellness check.  On exam patient is minimally responsive to very painful stimuli.  He was covered in feces.  Given significant altered mental status and unclear etiology the patient's symptoms decision was made to emergently intubate him.  Differential would include intracranial bleed or stroke, infection, toxic encephalopathy amongst other etiologies.  Head CT did not show any acute abnormality.  Patient did have minimally elevated lactic and white blood cell count.  Patient was ordered IV antibiotics however unclear if this is truly septic picture.  No clear source of infection.  Patient did have a brief episode of hypotension after intubation however this did respond well to fluids.  Patient will be admitted to the intensivist service.    ____________________________________________   FINAL CLINICAL IMPRESSION(S) / ED DIAGNOSES  Final diagnoses:  None     Note: This dictation was prepared with Dragon dictation. Any transcriptional errors that result from this process are unintentional     Phineas Semen, MD 07/05/21 2253

## 2021-07-05 NOTE — H&P (Addendum)
NAME:  Gabriel [name]., MRN:  [id number], DOB:  1932-08-04, LOS: 0 ADMISSION DATE:  07/05/2021, CONSULTATION DATE:  07/05/21 REFERRING MD:  Dr. Erma Heritage, CHIEF COMPLAINT:  Unresponsive  History of Present Illness:  87 yo male presenting to [facility] ED via EMS after being found on the ground of his home unresponsive by police during a wellness check.  Per ED documentation and police report they were called to the patient's house for a welfare check possibly by a neighbor, and that no one had seen him for roughly 4 days.  When police arrived they found the patient down on the ground in the kitchen covered in feces and insects. ED course:  Initial vitals: T-100.2, tachypneic at 36, tachycardic at 125, BP stable at 114/52 & SPO2 97% with bag-valve-mask support Significant labs: Hyperkalemia at 5.1, acute renal failure BUN/Cr-133/5.10, AST elevated at 78, total bilirubin elevated at 1.8, CK indicative of rhabdomyolysis at 3225, troponin elevated at 201> 229, lactic acidosis: 2.8> 3.6, mild leukocytosis with left shift at 13.7, hemoglobin elevated at 17.6.  CTh negative for acute intracranial abnormality.  CT cervical spine negative for fracture.  CXR reveals LLL atelectasis versus developing infiltrate.  UA not consistent with UTI.  Patient intubated emergently and placed on mechanical ventilation due to inability to protect airway in unresponsive state.  Sepsis protocol initiated and patient received 3.5 L of IV fluid resuscitation and empiric antibiotic coverage. PCCM consulted for admission and further management. Pertinent  Medical History  CVA Type 2 diabetes mellitus Hyperlipidemia Hypertension  Significant Hospital Events: Including procedures, antibiotic start and stop dates in addition to other pertinent events   07/05/2021-patient admitted to ICU after being found unresponsive at home requiring emergent intubation and mechanical ventilation in the ER.  Work-up for rhabdomyolysis and  sepsis  Interim History / Subjective:  Patient responsive to verbal stimuli but lethargic and inconsistent.  Able to open eyes without tracking or focus, with persistent stimulation able to give thumbs up in both hands to command and wiggle toes.  Possibly still holding onto intubation medications in the setting of acute renal failure.  Labs/ Imaging personally reviewed I, Cheryll Cockayne Rust-Chester, AGACNP-BC, personally viewed and interpreted this ECG. EKG Interpretation Date: 07/05/2021 EKG Time: 20:09 Rate: 114 Rhythm: Sinus tachycardia QRS Axis: LAD Intervals: RBBB with bi-atrial enlargement and borderline LAFB, also QTc prolongation ST/T Wave abnormalities: non-specific T wave inversions, unchanged from previous Narrative Interpretation: ST with RBBB  Na+/ K+: 140/ 5.1 BUN/Cr.: 133/ 5.10 Serum CO2/ AG: 25/ 14  Hgb: 17.6 Troponin: 201 > 229 CK: 3,225  WBC/ TMAX: 13.7 Lactic: 2.8 > 3.6  VBG: 7.32/ 53/ 98/ 27.3 CXR 07/05/21: Probable small L pleural effusion & LLL atelectasis vs developing infiltrate. Bilateral hilar prominence. CT head without contrast 07/05/21: no acute intracranial abnormality. Small vessel white matter disease and small foci of bilateral occipital encephalomalacia CT cervical spine 07/05/21: No fracture or static subluxation of the cervical spine Objective   Blood pressure (!) 114/57, pulse 99, temperature 97.9 F (36.6 C), resp. rate (!) 21, height 6' 2.02" (1.88 m), weight 91.6 kg, SpO2 95 %.    Vent Mode: AC FiO2 (%):  [100 %] 100 % Set Rate:  [18 bmp-24 bmp] 18 bmp Vt Set:  [500 mL] 500 mL PEEP:  [5 cmH20] 5 cmH20  No intake or output data in the 24 hours ending 07/05/21 [facility] Weights   07/05/21 2011 07/05/21 2034  Weight: 109 kg 91.6 kg    Examination:  General: Adult male, critically ill, lying in bed intubated & sedated requiring mechanical ventilation NAD HEENT: MM pink/dry> with crusted debris on mouth/face/eyes, anicteric, atraumatic,  neck supple Neuro: RASS -2 /-3 . opens eyes to persistent verbal stimuli, but inconsistent.  Able to follow intermittent simple commands, patient able to give thumbs up with each hand and wiggle toes but does not withdraw to painful stimuli. PERRL +3, MAE-generalized weakness CV: s1s2 RRR, ST on monitor, no r/m/g Pulm: Regular, non labored on AC at 55% FiO2, and PEEP of 5, breath sounds clear diminished-BUL & diminished-BLL GI: soft, rounded, bs x 4 GU: foley in place with clear amber urine Skin: Multiple abrasions in different stages of healing: Left forearm, forehead, right shoulder, upper back, bilateral knees and shins & sacral injury > nursing actively cleaning dried feces from all of her body Extremities: warm/dry, pulses + 2 R/P, no edema noted  Resolved Hospital Problem list     Assessment & Plan:  Acute Hypoxic Respiratory Failure secondary to acute encephalopathy and inability to protect airway - Ventilator settings: PRVC  8 mL/kg, 55% FiO2, 5 PEEP, continue ventilator support & lung protective strategies - Wean PEEP & FiO2 as tolerated, maintain SpO2 > 90% - Head of bed elevated 30 degrees, VAP protocol in place - Plateau pressures less than 30 cm H20  - Intermittent chest x-ray & ABG PRN - Daily WUA with SBT as tolerated  - Ensure adequate pulmonary hygiene  - F/u cultures, trend PCT - Initiate aspiration Pna coverage unasyn - Bronchodilators PRN - PAD protocol in place: continue Fentanyl IVP & Versed IVP PRN  Suspected Sepsis without septic shock due to suspected aspiration pneumonia Lactic Acidosis in the setting of Rhabdomyolysis  Lactic: 2.8 > 3.6, Baseline PCT: not useful in renal failure, UA: +protein/glucose/rare bacteria (obly 0-5 WBC's), CXR: LLL opacity/ atelectasis vs infiltrate  Initial interventions/workup included: 3.5 L of LR & Cefepime & Vancomycin - Supplemental oxygen as needed, to maintain SpO2 > 90% - f/u cultures, trend lactic/ PCT - Daily CBC -  monitor WBC/ fever curve - IV antibiotics: Unasyn (aspiration PNA) - Aggressive IVF hydration  - Consider vasopressors to maintain MAP< 65, norepinephrine 1 st line - Strict I/O's: alert provider if UOP < 0.5 mL/kg/hr   Acute Kidney Injury secondary to Rhabdomyolysis & suspected sepsis  Baseline Cr: 0.9, Cr on admission: 5.10, CK- 3, 225 - Strict I/O's: alert provider if UOP < 0.5 mL/kg/hr -  IVF hydration  - Daily BMP, replace electrolytes PRN - Avoid nephrotoxic agents as able, ensure adequate renal perfusion - Consult nephrology if iHD or CRRT indicated  - Obtain urine lytes - consider renal US  Acute Metabolic Encephalopathy in the setting of Acute Kidney Injury -Query Acute CVA?  PMHx: CVA, cerebral arterial thrombosis on warfarin CTH wo contrast negative for intracranial abnormality. More responsive but unable to fully participate in neuro exam. Patient's wife recently died, unclear how the patient has been handling this loss - f/u MRI to rule out Acute CVA  - consider restarting systemic anticoagulation if MRI negative for new stroke, INR within therapeutic range at 2.2 - Q 4 neuro checks - supportive care - UDS pending  Elevated Troponin due to demand ischemia vs N-STEMI Suspected Cardiomyopathy  RBBB, bi-atrial enlargement & borderline LAFB noted on 12 lead. Favor demand ischemia, will start systemic anticoagulation once MRI completed for cerebral thrombosis - trend troponin - continuous cardiac monitoring - echocardiogram ordered  Uncontrolled Type 2 Diabetes Mellitus Hemoglobin A1C: pending -  Monitor CBG Q 4 hours - SSI resistant dosing, home regimen on hold  - target range while in ICU: 140-180 - follow ICU hyper/hypo-glycemia protocol  Hyperlipidemia Hypertension - home regimen on hold in the setting of rhabdomyolysis & marginal BP  Sacral Pressure injury & scattered abrasions- present on admission, found down on kitchen floor at home.  Per brother patient  has issues with mobility and uses a walker/cane at home to get around. - WOC consulted, appreciate input - follow standing skin care orders - Turn and reposition Q 2  Best Practice (right click and "Reselect all SmartList Selections" daily)  Diet/type: NPO DVT prophylaxis: prophylactic heparin  GI prophylaxis: PPI Lines: N/A Foley:  Yes, and it is still needed Code Status:  full code Last date of multidisciplinary goals of care discussion 07/05/21  Spoke with the patient's brother, Kayler Rise, bedside. Patient's wife died 1. 5 weeks ago. Patient has 2 children: son who is estranged and his daughter who lives in [place]. Brother & sister are local. 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  Labs   CBC: Recent Labs  Lab 07/05/21 2033  WBC 13.7*  NEUTROABS 11.1*  HGB 17.6*  HCT 52.0  MCV 101.8*  PLT 278    Basic Metabolic Panel: Recent Labs  Lab 07/05/21 2033  NA 140  K 5.1  CL 101  CO2 25  GLUCOSE 472*  BUN 133*  CREATININE 5.10*  CALCIUM 9.7   GFR: Estimated Creatinine Clearance: 11.9 mL/min (A) (by C-G formula based on SCr of 5.1 mg/dL (H)). Recent Labs  Lab 07/05/21 2033  WBC 13.7*  LATICACIDVEN 2.8*    Liver Function Tests: Recent Labs  Lab 07/05/21 2033  AST 78*  ALT 43  ALKPHOS 46  BILITOT 1.8*  PROT 6.6  ALBUMIN 3.0*   No results for input(s): LIPASE, AMYLASE in the last 168 hours. No results for input(s): AMMONIA in the last 168 hours.  ABG    Component Value Date/Time   HCO3 27.3 07/05/2021 2034   ACIDBASEDEF 0.1 07/05/2021 2034   O2SAT 97.0 07/05/2021 2034     Coagulation Profile: Recent Labs  Lab 07/05/21 2033  INR 2.2*    Cardiac Enzymes: Recent Labs  Lab 07/05/21 2033  CKTOTAL 3,225*    HbA1C: Hemoglobin A1C  Date/Time Value Ref Range Status  10/25/2014 04:15 AM 8.3 (H) 4.2 - 6.3 % Final     Comment:    The American Diabetes Association recommends that a primary goal of therapy should be <7% and that physicians should reevaluate the treatment regimen in patients with HbA1c values consistently >8%.     CBG: No results for input(s): GLUCAP in the last 168 hours.  Review of Systems:   Patient intubated, unable to participate in interview  Past Medical History:  He,  has a past medical history of Cerebral arterial thrombosis (08/09/2011), Cerebrovascular accident, old (10/27/2015), Diabetes mellitus without complication (HCC), Essential (primary) hypertension (09/25/2014), HLD (hyperlipidemia) (10/27/2015), Hypertension, Nephrolithiasis, Pure hypercholesterolemia (09/25/2014), and Type 2 diabetes mellitus (HCC) (09/25/2014).   Surgical History:   Past Surgical History:  Procedure Laterality Date   CHOLECYSTECTOMY     CYSTOSCOPY WITH STENT PLACEMENT Left 11/02/2015   Procedure: CYSTOSCOPY WITH STENT PLACEMENT;  Surgeon: Vanna Scotland, MD;  Location: [facility] ORS;  Service: Urology;  Laterality: Left;   TONSILLECTOMY     URETEROSCOPY WITH  HOLMIUM LASER LITHOTRIPSY Left 11/02/2015   Procedure: URETEROSCOPY WITH HOLMIUM LASER LITHOTRIPSY;  Surgeon: Vanna Scotland, MD;  Location: [facility] ORS;  Service: Urology;  Laterality: Left;     Social History:   reports that he has never smoked. He has never used smokeless tobacco. He reports that he does not drink alcohol and does not use drugs.   Family History:  His family history includes Nephrolithiasis in his brother. There is no history of Prostate cancer, Bladder Cancer, or Kidney cancer.   Allergies Allergies  Allergen Reactions   Darvon [Propoxyphene] Hives     Home Medications  Prior to Admission medications   Medication Sig Start Date End Date Taking? Authorizing Provider  acetaminophen (TYLENOL) 500 MG tablet Take 500 mg by mouth every 6 (six) hours as needed.    [provider]  aspirin EC 81 MG tablet Take 81  mg by mouth daily.    [provider]  atorvastatin (LIPITOR) 40 MG tablet Take 40 mg by mouth at bedtime.    [provider]  glipiZIDE (GLUCOTROL XL) 5 MG 24 hr tablet Take 5 mg by mouth daily.    [provider]  JANUVIA 100 MG tablet  11/09/15   [provider]  lisinopril (PRINIVIL,ZESTRIL) 5 MG tablet TAKE 3 TABLETS IN THE MORNING, 1 TABLET IN THE EVENING AND 1 TABLET AT BEDTIME 11/09/15   [provider]  metFORMIN (GLUCOPHAGE) 1000 MG tablet Take 1,000 mg by mouth 2 (two) times daily.    [provider]  metoprolol succinate (TOPROL-XL) 25 MG 24 hr tablet Take 25 mg by mouth 2 (two) times daily.    [provider]  oxybutynin (DITROPAN) 5 MG tablet Take 1 tablet (5 mg total) by mouth every 8 (eight) hours as needed for bladder spasms. Patient not taking: Reported on 11/16/2015 11/02/15   Vanna Scotland, MD  tamsulosin (FLOMAX) 0.4 MG CAPS capsule Take 1 capsule (0.4 mg total) by mouth daily. 12/03/15   Vanna Scotland, MD  vitamin C (ASCORBIC ACID) 500 MG tablet Take 500 mg by mouth daily.    [provider]  vitamin E 400 UNIT capsule Take 400 Units by mouth daily.    [provider]  warfarin (COUMADIN) 5 MG tablet Take 5 mg by mouth daily.    [provider]     Critical care time: 57 minutes       Betsey Holiday, AGACNP-BC Acute Care Nurse Practitioner [facility]   [phone] / [phone] Please see Amion for pager details.

## 2021-07-05 NOTE — Consult Note (Signed)
PHARMACY -  BRIEF ANTIBIOTIC NOTE   Pharmacy has received consult(s) for vancomycin and cerfepime from an ED provider.  The patient's profile has been reviewed for ht/wt/allergies/indication/available labs.    One time order(s) placed for vancomycin 2 g and cefepime 2 g   Further antibiotics/pharmacy consults should be ordered by admitting physician if indicated.                       Thank you, Derrek Gu, PharmD 07/05/2021  8:48 PM

## 2021-07-05 NOTE — ED Notes (Signed)
Oral hygiene performed by provider - pt attached to intermittent suction.

## 2021-07-05 NOTE — ED Triage Notes (Addendum)
Patient was found unresponsive on the kitchen floor in his home by police after performing a wellness check. Pt has not been seen or heard from in 4 days. Upon arrival pt was being bagged and had pinpoint pupils, and was still unresponsive, EMS administered 1mg  of narcan without any change in sx. Pt covered in dried fecal matter and ants.

## 2021-07-05 NOTE — ED Notes (Signed)
Pt provided a full linen change and partial bed bath. Mepilex applied to the sacrum and right shoulder blade.

## 2021-07-05 NOTE — Progress Notes (Signed)
Elink following for Sepsis Protocol 

## 2021-07-05 NOTE — Consult Note (Signed)
CODE SEPSIS - PHARMACY COMMUNICATION  **Broad Spectrum Antibiotics should be administered within 1 hour of Sepsis diagnosis**  Time Code Sepsis Called/Page Received: 2033  Antibiotics Ordered: 2033  Time of 1st antibiotic administration: 2102  Additional action taken by pharmacy: N/A  If necessary, Name of Provider/Nurse Contacted: N/A    Derrek Gu ,PharmD Clinical Pharmacist  07/05/2021  8:48 PM

## 2021-07-06 ENCOUNTER — Inpatient Hospital Stay: Payer: No Typology Code available for payment source

## 2021-07-06 ENCOUNTER — Inpatient Hospital Stay
Admit: 2021-07-06 | Discharge: 2021-07-06 | Disposition: A | Discharge status: Home / Self Care | Payer: No Typology Code available for payment source | Attending: Pulmonary Disease | Admitting: Pulmonary Disease

## 2021-07-06 DIAGNOSIS — M6282 Rhabdomyolysis: Secondary | ICD-10-CM

## 2021-07-06 DIAGNOSIS — J9601 Acute respiratory failure with hypoxia: Secondary | ICD-10-CM | POA: Diagnosis not present

## 2021-07-06 DIAGNOSIS — Z7189 Other specified counseling: Secondary | ICD-10-CM

## 2021-07-06 DIAGNOSIS — R4182 Altered mental status, unspecified: Secondary | ICD-10-CM

## 2021-07-06 DIAGNOSIS — G9341 Metabolic encephalopathy: Secondary | ICD-10-CM

## 2021-07-06 DIAGNOSIS — IMO0002 Reserved for concepts with insufficient information to code with codable children: Secondary | ICD-10-CM

## 2021-07-06 DIAGNOSIS — L89159 Pressure ulcer of sacral region, unspecified stage: Secondary | ICD-10-CM

## 2021-07-06 DIAGNOSIS — N179 Acute kidney failure, unspecified: Secondary | ICD-10-CM | POA: Diagnosis not present

## 2021-07-06 DIAGNOSIS — R748 Abnormal levels of other serum enzymes: Secondary | ICD-10-CM | POA: Diagnosis present

## 2021-07-06 DIAGNOSIS — E1165 Type 2 diabetes mellitus with hyperglycemia: Secondary | ICD-10-CM

## 2021-07-06 DIAGNOSIS — Z9911 Dependence on respirator [ventilator] status: Secondary | ICD-10-CM

## 2021-07-06 LAB — URINE DRUG SCREEN, QUALITATIVE (ARMC ONLY)
Amphetamines, Ur Screen: NOT DETECTED
Barbiturates, Ur Screen: NOT DETECTED
Benzodiazepine, Ur Scrn: NOT DETECTED
Cannabinoid 50 Ng, Ur ~~LOC~~: NOT DETECTED
Cocaine Metabolite,Ur ~~LOC~~: NOT DETECTED
MDMA (Ecstasy)Ur Screen: NOT DETECTED
Methadone Scn, Ur: NOT DETECTED
Opiate, Ur Screen: NOT DETECTED
Phencyclidine (PCP) Ur S: NOT DETECTED
Tricyclic, Ur Screen: NOT DETECTED

## 2021-07-06 LAB — CBG MONITORING, ED
Glucose-Capillary: 261 mg/dL — ABNORMAL HIGH (ref 70–99)
Glucose-Capillary: 287 mg/dL — ABNORMAL HIGH (ref 70–99)
Glucose-Capillary: 346 mg/dL — ABNORMAL HIGH (ref 70–99)

## 2021-07-06 LAB — GLUCOSE, CAPILLARY
Glucose-Capillary: 156 mg/dL — ABNORMAL HIGH (ref 70–99)
Glucose-Capillary: 151 mg/dL — ABNORMAL HIGH (ref 70–99)
Glucose-Capillary: 173 mg/dL — ABNORMAL HIGH (ref 70–99)
Glucose-Capillary: 163 mg/dL — ABNORMAL HIGH (ref 70–99)

## 2021-07-06 LAB — BASIC METABOLIC PANEL
Anion gap: 9 (ref 5–15)
BUN: 118 mg/dL — ABNORMAL HIGH (ref 8–23)
CO2: 24 mmol/L (ref 22–32)
Calcium: 8.9 mg/dL (ref 8.9–10.3)
Chloride: 109 mmol/L (ref 98–111)
Creatinine, Ser: 3.63 mg/dL — ABNORMAL HIGH (ref 0.61–1.24)
GFR, Estimated: 16 mL/min — ABNORMAL LOW (ref >60)
Glucose, Bld: 327 mg/dL — ABNORMAL HIGH (ref 70–99)
Potassium: 4 mmol/L (ref 3.5–5.1)
Sodium: 142 mmol/L (ref 135–145)

## 2021-07-06 LAB — CBC
HCT: 49.2 % (ref 39.0–52.0)
Hemoglobin: 16.3 g/dL (ref 13.0–17.0)
MCH: 34.3 pg — ABNORMAL HIGH (ref 26.0–34.0)
MCHC: 33.1 g/dL (ref 30.0–36.0)
MCV: 103.6 fL — ABNORMAL HIGH (ref 80.0–100.0)
Platelets: 194 10*3/uL (ref 150–400)
RBC: 4.75 MIL/uL (ref 4.22–5.81)
RDW: 13.2 % (ref 11.5–15.5)
WBC: 11.8 10*3/uL — ABNORMAL HIGH (ref 4.0–10.5)
nRBC: 0 % (ref 0.0–0.2)

## 2021-07-06 LAB — ECHOCARDIOGRAM COMPLETE
Height: 74.016 in
S' Lateral: 2.25 cm
Weight: 3230.4 oz

## 2021-07-06 LAB — MRSA NEXT GEN BY PCR, NASAL
MRSA by PCR Next Gen: NOT DETECTED
MRSA by PCR Next Gen: NOT DETECTED

## 2021-07-06 LAB — CORTISOL: Cortisol, Plasma: 46.8 ug/dL

## 2021-07-06 LAB — LACTIC ACID, PLASMA
Lactic Acid, Venous: 2.3 mmol/L (ref 0.5–1.9)
Lactic Acid, Venous: 2.4 mmol/L (ref 0.5–1.9)

## 2021-07-06 LAB — CREATININE, URINE, RANDOM: Creatinine, Urine: 77 mg/dL

## 2021-07-06 LAB — CK
Total CK: 7798 U/L — ABNORMAL HIGH (ref 49–397)
Total CK: 4038 U/L — ABNORMAL HIGH (ref 49–397)

## 2021-07-06 LAB — TSH: TSH: 0.759 u[IU]/mL (ref 0.350–4.500)

## 2021-07-06 LAB — PROTIME-INR
INR: 2.4 — ABNORMAL HIGH (ref 0.8–1.2)
Prothrombin Time: 25.8 seconds — ABNORMAL HIGH (ref 11.4–15.2)

## 2021-07-06 LAB — SODIUM, URINE, RANDOM: Sodium, Ur: 37 mmol/L

## 2021-07-06 LAB — PHOSPHORUS: Phosphorus: 5 mg/dL — ABNORMAL HIGH (ref 2.5–4.6)

## 2021-07-06 LAB — TROPONIN I (HIGH SENSITIVITY): Troponin I (High Sensitivity): 66 ng/L — ABNORMAL HIGH (ref <18)

## 2021-07-06 LAB — MAGNESIUM: Magnesium: 2.6 mg/dL — ABNORMAL HIGH (ref 1.7–2.4)

## 2021-07-06 MED ORDER — GERHARDT'S BUTT CREAM
TOPICAL_CREAM | Freq: Two times a day (BID) | CUTANEOUS | Status: DC
  Administered 2021-07-13: 1 via TOPICAL
  Filled 2021-07-06 (×2): qty 1

## 2021-07-06 MED ORDER — MUPIROCIN CALCIUM 2 % EX CREA
TOPICAL_CREAM | Freq: Two times a day (BID) | CUTANEOUS | Status: DC
  Filled 2021-07-06: qty 15

## 2021-07-06 MED ORDER — INSULIN GLARGINE-YFGN 100 UNIT/ML ~~LOC~~ SOLN
8.0000 [IU] | Freq: Every day | SUBCUTANEOUS | Status: DC
  Administered 2021-07-06 – 2021-07-07 (×2): 8 [IU] via SUBCUTANEOUS
  Filled 2021-07-06 (×3): qty 0.08

## 2021-07-06 MED ORDER — SODIUM CHLORIDE 0.45 % IV SOLN
INTRAVENOUS | Status: DC
  Filled 2021-07-06 (×4): qty 75

## 2021-07-06 MED ORDER — SODIUM CHLORIDE 0.9 % IV SOLN
3.0000 g | Freq: Two times a day (BID) | INTRAVENOUS | Status: DC
  Administered 2021-07-06 – 2021-07-07 (×3): 3 g via INTRAVENOUS
  Filled 2021-07-06: qty 3
  Filled 2021-07-06 (×2): qty 8
  Filled 2021-07-06: qty 3
  Filled 2021-07-06: qty 8

## 2021-07-06 MED ORDER — INSULIN GLARGINE 100 UNIT/ML ~~LOC~~ SOLN
8.0000 [IU] | Freq: Every day | SUBCUTANEOUS | Status: DC
  Filled 2021-07-06: qty 0.08

## 2021-07-06 MED ORDER — SODIUM CHLORIDE 0.9 % IV SOLN
500.0000 mg | INTRAVENOUS | Status: DC
  Administered 2021-07-06: 500 mg via INTRAVENOUS
  Filled 2021-07-06: qty 500

## 2021-07-06 MED ORDER — COLLAGENASE 250 UNIT/GM EX OINT
1.0000 "application " | TOPICAL_OINTMENT | Freq: Every day | CUTANEOUS | Status: DC
  Administered 2021-07-07 – 2021-07-15 (×9): 1 via TOPICAL
  Filled 2021-07-06 (×2): qty 30

## 2021-07-06 MED ORDER — SODIUM CHLORIDE 0.9 % IV SOLN: 2.0000 g | INTRAVENOUS | Status: DC

## 2021-07-06 MED ORDER — CHLORHEXIDINE GLUCONATE CLOTH 2 % EX PADS
6.0000 | MEDICATED_PAD | Freq: Every day | CUTANEOUS | Status: DC
  Administered 2021-07-06 – 2021-07-15 (×9): 6 via TOPICAL

## 2021-07-06 MED ORDER — INSULIN ASPART 100 UNIT/ML IJ SOLN
0.0000 [IU] | INTRAMUSCULAR | Status: DC
  Administered 2021-07-06: 15 [IU] via SUBCUTANEOUS
  Administered 2021-07-06 (×2): 4 [IU] via SUBCUTANEOUS
  Administered 2021-07-06: 11 [IU] via SUBCUTANEOUS
  Administered 2021-07-06 – 2021-07-07 (×2): 4 [IU] via SUBCUTANEOUS
  Filled 2021-07-06 (×6): qty 1

## 2021-07-06 MED ORDER — SODIUM CHLORIDE 0.9 % IV SOLN: INTRAVENOUS | Status: AC

## 2021-07-06 MED ORDER — ALBUTEROL SULFATE (2.5 MG/3ML) 0.083% IN NEBU: 2.5000 mg | INHALATION_SOLUTION | RESPIRATORY_TRACT | Status: DC | PRN

## 2021-07-06 MED ORDER — WARFARIN SODIUM 2.5 MG PO TABS
2.5000 mg | ORAL_TABLET | Freq: Once | ORAL | Status: AC
  Administered 2021-07-06: 2.5 mg via ORAL
  Filled 2021-07-06: qty 1

## 2021-07-06 MED ORDER — CLARITHROMYCIN 500 MG PO TABS
250.0000 mg | ORAL_TABLET | Freq: Two times a day (BID) | ORAL | Status: DC
  Administered 2021-07-06: 250 mg via ORAL
  Filled 2021-07-06 (×2): qty 0.5

## 2021-07-06 MED ORDER — CLARITHROMYCIN 125 MG/5ML PO SUSR
250.0000 mg | Freq: Two times a day (BID) | ORAL | Status: AC
  Administered 2021-07-06: 250 mg
  Filled 2021-07-06: qty 10

## 2021-07-06 MED ORDER — CLARITHROMYCIN 125 MG/5ML PO SUSR
250.0000 mg | Freq: Two times a day (BID) | ORAL | Status: DC
  Filled 2021-07-06 (×2): qty 10
  Filled 2021-07-06 (×2): qty 5

## 2021-07-06 MED ORDER — WARFARIN - PHARMACIST DOSING INPATIENT
Freq: Every day | Status: DC
Start: 2021-07-06 — End: 2021-07-15

## 2021-07-06 MED ORDER — SODIUM CHLORIDE 0.9 % IV BOLUS
1000.0000 mL | Freq: Once | INTRAVENOUS | Status: AC
  Administered 2021-07-06: 1000 mL via INTRAVENOUS

## 2021-07-06 NOTE — Consult Note (Signed)
Consultation Note Date: 07/06/2021   Patient Name: Gabriel [name].  DOB: Apr 18, 1932  MRN: [id number]  Age / Sex: 87 y.o., male  PCP: Dione Housekeeper, MD Referring Physician: Salena Saner, MD  Reason for Consultation: Establishing goals of care  HPI/Patient Profile: 87 yo male presenting to [facility] ED via EMS after being found on the ground of his home unresponsive by police during a wellness check.  Per ED documentation and police report they were called to the patient's house for a welfare check possibly by a neighbor, and that no one had seen him for roughly 4 days.   Clinical Assessment and Goals of Care: Patient is resting in bed on ventilator. Brother was at bedside. He states patient was in the [employer]. He spent many years in the [employer]. Brother states patient "has a daughter who was born out of wedlock when he was in [place] during his tour of duty, and the lives in [place]". He states patient speaks to her occasionally. He states patient has a son. When he and his wife divorced, the step father adopted him, and patient does not have a relationship with him. Brother states he does not have contact information.  He states prior to this admission, patient was living alone and fully independent. 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      CCM in during conversations. Weaning trial underway. Will see how he does. Hopeful patient will come to a place of having his own GOC.     SUMMARY OF RECOMMENDATIONS   SBT underway. Hopeful for extubation where patient can have GOC conversation.         Primary Diagnoses: Present on Admission:  Acute respiratory failure (HCC)  Elevated CK   I have reviewed the medical record, interviewed the patient and family, and examined the patient. The following aspects are pertinent.  Past Medical History:  Diagnosis Date    Cerebral arterial thrombosis 08/09/2011   Cerebrovascular accident, old 10/27/2015   Diabetes mellitus without complication (HCC)    Metformin   Essential (primary) hypertension 09/25/2014   HLD (hyperlipidemia) 10/27/2015   Hypertension    Nephrolithiasis    Pure hypercholesterolemia 09/25/2014   Type 2 diabetes mellitus (HCC) 09/25/2014   Social History   Socioeconomic History   Marital status: Widowed    Spouse name: Not on file   Number of children: Not on file   Years of education: Not on file   Highest education level: Not on file  Occupational History   Not on file  Tobacco Use   Smoking status: Never   Smokeless tobacco: Never  Substance and Sexual Activity   Alcohol use: No    Alcohol/week: 0.0 standard drinks   Drug use: No   Sexual activity: Not on file  Other Topics Concern   Not on file  Social History Narrative   Not on file   Social Determinants of Health   Financial Resource Strain: Not on file  Food Insecurity: Not  on file  Transportation Needs: Not on file  Physical Activity: Not on file  Stress: Not on file  Social Connections: Not on file   Family History  Problem Relation Age of Onset   Prostate cancer Neg Hx    Bladder Cancer Neg Hx    Kidney cancer Neg Hx    Nephrolithiasis Brother    Scheduled Meds:  Chlorhexidine Gluconate Cloth  6 each Topical Daily   collagenase  1 application Topical Daily   docusate  100 mg Per Tube BID   insulin aspart  0-20 Units Subcutaneous Q4H   insulin glargine-yfgn  8 Units Subcutaneous Daily   pantoprazole (PROTONIX) IV  40 mg Intravenous QHS   polyethylene glycol  17 g Per Tube Daily   Continuous Infusions:  sodium chloride 150 mL/hr at 07/06/21 1200   ampicillin-sulbactam (UNASYN) IV Stopped (07/06/21 0242)   PRN Meds:.albuterol, docusate sodium, fentaNYL (SUBLIMAZE) injection, midazolam, midazolam, polyethylene glycol Medications Prior to Admission:  Prior to Admission medications   Medication  Sig Start Date End Date Taking? Authorizing Provider  acetaminophen (TYLENOL) 500 MG tablet Take 500 mg by mouth every 6 (six) hours as needed.   Yes [provider]  atorvastatin (LIPITOR) 40 MG tablet Take 40 mg by mouth at bedtime.   Yes [provider]  empagliflozin (JARDIANCE) 10 MG TABS tablet Take 10 mg by mouth daily. 03/30/21 03/30/22 Yes [provider]  glipiZIDE (GLUCOTROL XL) 10 MG 24 hr tablet Take 10 mg by mouth daily. 04/19/21  Yes [provider]  hydrochlorothiazide (HYDRODIURIL) 25 MG tablet Take 25 mg by mouth daily. 06/15/21  Yes [provider]  lisinopril (ZESTRIL) 20 MG tablet Take 20 mg by mouth 2 (two) times daily. 06/18/21  Yes [provider]  metFORMIN (GLUCOPHAGE) 1000 MG tablet Take 1,000 mg by mouth 2 (two) times daily.   Yes [provider]  metoprolol succinate (TOPROL-XL) 25 MG 24 hr tablet Take 25 mg by mouth 2 (two) times daily.   Yes [provider]  tamsulosin (FLOMAX) 0.4 MG CAPS capsule Take 1 capsule (0.4 mg total) by mouth daily. 12/03/15  Yes Vanna Scotland, MD  warfarin (COUMADIN) 5 MG tablet Take 5 mg by mouth daily.   Yes [provider]  aspirin EC 81 MG tablet Take 81 mg by mouth daily.    [provider]  glipiZIDE (GLUCOTROL XL) 5 MG 24 hr tablet Take 5 mg by mouth daily. Patient not taking: No sig reported    [provider]  JANUVIA 100 MG tablet  11/09/15   [provider]  lisinopril (PRINIVIL,ZESTRIL) 5 MG tablet TAKE 3 TABLETS IN THE MORNING, 1 TABLET IN THE EVENING AND 1 TABLET AT BEDTIME Patient not taking: No sig reported 11/09/15   [provider]  oxybutynin (DITROPAN) 5 MG tablet Take 1 tablet (5 mg total) by mouth every 8 (eight) hours as needed for bladder spasms. Patient not taking: No sig reported 11/02/15   Vanna Scotland, MD  vitamin C (ASCORBIC ACID) 500 MG tablet Take 500 mg by mouth daily.    [provider]   vitamin E 400 UNIT capsule Take 400 Units by mouth daily.    [provider]   Allergies  Allergen Reactions   Darvon [Propoxyphene] Hives   Glipizide Other (See Comments)   Saxagliptin Other (See Comments)   Review of Systems  Unable to perform ROS  Physical Exam Constitutional:      Comments: Eyes closed. On  ventilator.     Vital Signs: BP (!) 113/59   Pulse 94   Temp 97.9 F (36.6 C) (Bladder)   Resp (!) 28   Ht 6' 2.02" (1.88 m)   Wt 91.6 kg   SpO2 100%   BMI 25.91 kg/m  Pain Scale: CPOT   Pain Score: Asleep   SpO2: SpO2: 100 % O2 Device:SpO2: 100 % O2 Flow Rate: .   IO: Intake/output summary:  Intake/Output Summary (Last 24 hours) at 07/06/2021 1255 Last data filed at 07/06/2021 1200 Gross per 24 hour  Intake 7453.14 ml  Output 2610 ml  Net 4843.14 ml    LBM: Last BM Date:  (PTA) Baseline Weight: Weight: 109 kg Most recent weight: Weight: 91.6 kg        Time In: 11:00 Time Out: 11:30 Time Total: 30 min Greater than 50%  of this time was spent counseling and coordinating care related to the above assessment and plan.  Signed by: Morton Stall, NP   Please contact Palliative Medicine Team phone at [phone] for questions and concerns.  For individual provider: See Loretha Stapler

## 2021-07-06 NOTE — Consult Note (Addendum)
WOC Nurse Consult Note: Reason for Consult: Consult requested for sacrum.  Pt was found down for an extended period of time prior to admission. Wound type: Sacurn with Unstageable pressure injury; 5X2cm, 80% yellow slough, 20% red, erythemia surrounding wound to a total area of approx 6X6cm Left heel with deep tissue pressure injury; 4X1cm, dark purple red intact skin Right heel with deep tissue pressure injury; .5X1cm, dark purple red intact skin Pressure Injury POA: Yes Dressing procedure/placement/frequency: Float heels to reduce pressure.  Air mattress to reduce pressure. Topical treatment orders provided for bedside nurses to perform as follows to provide anzymatic debridement of nonviable tissue: Apply Santyl to sacrum Q day, then cover with moist gauze and foam dressing.  (Change foam dressing Q 3 days or PRN soiling). Please re-consult if further assistance is needed.  Thank-you,  Cammie Mcgee MSN, RN, CWOCN, [place], CNS [phone]

## 2021-07-06 NOTE — ED Notes (Signed)
XR Bedside

## 2021-07-06 NOTE — Progress Notes (Signed)
*  PRELIMINARY RESULTS* Echocardiogram 2D Echocardiogram has been performed.  Gabriel [name] 07/06/2021, 10:58 AM

## 2021-07-06 NOTE — Progress Notes (Signed)
NAME:  Gabriel [name]., MRN:  [id number], DOB:  01/06/1932, LOS: 1 ADMISSION DATE:  07/05/2021, CONSULTATION DATE:  07/05/21 REFERRING MD:  Dr. Erma Heritage, CHIEF COMPLAINT:  Unresponsive  History of Present Illness:  87 yo male presenting to [facility] ED via EMS after being found on the ground of his home unresponsive by police during a wellness check.  Per ED documentation and police report they were called to the patient's house for a welfare check possibly by a neighbor, and that no one had seen him for roughly 4 days.  When police arrived they found the patient down on the ground in the kitchen covered in feces and insects. ED course:  Initial vitals: T-100.2, tachypneic at 36, tachycardic at 125, BP stable at 114/52 & SPO2 97% with bag-valve-mask support Significant labs: Hyperkalemia at 5.1, acute renal failure BUN/Cr-133/5.10, AST elevated at 78, total bilirubin elevated at 1.8, CK indicative of rhabdomyolysis at 3225, troponin elevated at 201> 229, lactic acidosis: 2.8> 3.6, mild leukocytosis with left shift at 13.7, hemoglobin elevated at 17.6.  CTh negative for acute intracranial abnormality.  CT cervical spine negative for fracture.  CXR reveals LLL atelectasis versus developing infiltrate.  UA not consistent with UTI.  Patient intubated emergently and placed on mechanical ventilation due to inability to protect airway in unresponsive state.  Sepsis protocol initiated and patient received 3.5 L of IV fluid resuscitation and empiric antibiotic coverage. PCCM consulted for admission and further management. Pertinent  Medical History  CVA Type 2 diabetes mellitus Hyperlipidemia Hypertension  Significant Hospital Events: Including procedures, antibiotic start and stop dates in addition to other pertinent events   07/05/2021-patient admitted to ICU after being found unresponsive at home requiring emergent intubation and mechanical ventilation in the ER.  Work-up for rhabdomyolysis and  sepsis  Interim History / Subjective:  Patient responsive to verbal stimuli but lethargic and inconsistent.  Able to open eyes without tracking or focus, with persistent stimulation able to give thumbs up in both hands to command and wiggle toes.  Possibly still holding onto intubation medications in the setting of acute renal failure.  Labs/ Imaging personally reviewed  EKG Interpretation Date: 07/05/2021 EKG Time: 20:09 Rate: 114 Rhythm: Sinus tachycardia QRS Axis: LAD Intervals: RBBB with bi-atrial enlargement and borderline LAFB, also QTc prolongation ST/T Wave abnormalities: non-specific T wave inversions, unchanged from previous Narrative Interpretation: ST with RBBB  Na+/ K+: 140/ 5.1 BUN/Cr.: 133/ 5.10 Serum CO2/ AG: 25/ 14  Hgb: 17.6 Troponin: 201 > 229 CK: 3,225  WBC/ TMAX: 13.7 Lactic: 2.8 > 3.6  VBG: 7.32/ 53/ 98/ 27.3 CXR 07/05/21: Probable small L pleural effusion & LLL atelectasis vs developing infiltrate. Bilateral hilar prominence. CT head without contrast 07/05/21: no acute intracranial abnormality. Small vessel white matter disease and small foci of bilateral occipital encephalomalacia CT cervical spine 07/05/21: No fracture or static subluxation of the cervical spine Objective   Blood pressure (!) 127/54, pulse 99, temperature 98 F (36.7 C), resp. rate (!) 29, height 6' 2.02" (1.88 m), weight 91.6 kg, SpO2 100 %.    Vent Mode: PRVC FiO2 (%):  [35 %-100 %] 35 % Set Rate:  [18 bmp-24 bmp] 18 bmp Vt Set:  [500 mL] 500 mL PEEP:  [5 cmH20] 5 cmH20   Intake/Output Summary (Last 24 hours) at 07/06/2021 1052 Last data filed at 07/06/2021 0900 Gross per 24 hour  Intake 5947.99 ml  Output 2000 ml  Net 3947.99 ml   Filed Weights   07/05/21 2011  07/05/21 2034  Weight: 109 kg 91.6 kg    Examination: General: Adult male, critically ill appearing, lying in bed intubated & sedated requiring mechanical ventilation synchronous with vent, on wake up assessment does  follow commands HEENT: MM pink/dry> with crusted debris on mouth/face/eyes, anicteric, atraumatic, neck supple Neuro: RASS -2 /-3 . opens eyes to verbal stimuli.  Able to follow simple commands consistently, patient able to give thumbs up with each hand and wiggle toes but does withdraw to noxious stimuli. PERRL +3, MAE-generalized weakness CV: s1s2 RRR, ST on monitor, no r/m/g Pulm: Regular, non labored, breath sounds clear diminished-BUL & diminished-BLL GI: soft, rounded, bs x 4 GU: foley in place with dark amber urine, rash consistent with erythrasma at groin/scrotal area Extremities: warm/dry, pulses + 2 R/P, no edema noted Skin: Multiple abrasions in different stages of healing: Left forearm, forehead, right shoulder, upper back, bilateral knees and shins & sacral injury examples as below:          Chest x-ray today:   Resolved Hospital Problem list     Assessment & Plan:  Acute Hypoxic Respiratory Failure secondary to acute encephalopathy and inability to protect airway - Ventilator settings: PRVC  8 mL/kg, 35 % FiO2, 5 PEEP, continue ventilator support & lung protective strategies - Wean PEEP & FiO2 as tolerated, maintain SpO2 > 90% - Head of bed elevated 30 degrees, VAP protocol in place - Plateau pressures less than 30 cm H20  - Intermittent chest x-ray & ABG PRN - Daily WUA with SBT as tolerated  - Ensure adequate pulmonary hygiene  - F/u cultures, trend PCT - Continue unasyn - Bronchodilators PRN - PAD protocol in place: continue Fentanyl IVP & Versed IVP PRN - Doing well on SAT, initiate SBT today, plan for extubation if meets criteria  Suspected Sepsis without septic shock due to suspected aspiration pneumonia Lactic Acidosis in the setting of Rhabdomyolysis  Lactic: 2.8 > 3.6, Baseline PCT: not useful in renal failure, UA: +protein/glucose/rare bacteria (obly 0-5 WBC's), CXR: LLL opacity/ atelectasis vs infiltrate  Initial interventions/workup included: 3.5 L  of LR & Cefepime & Vancomycin - Supplemental oxygen as needed, to maintain SpO2 > 90% - f/u cultures, trend lactic/ PCT - Daily CBC - monitor WBC/ fever curve - IV antibiotics: Unasyn (aspiration PNA) - Aggressive IVF hydration  - Consider vasopressors to maintain MAP< 65, norepinephrine 1 st line - Strict I/O's: alert provider if UOP < 0.5 mL/kg/hr   Acute Kidney Injury secondary to Rhabdomyolysis & suspected sepsis  Baseline Cr: 0.9, Cr on admission: 5.10, CK- 3, 225 - Strict I/O's: alert provider if UOP < 0.5 mL/kg/hr -  IVF hydration, sodium bicarb infusion - Trend CPK - Daily BMP, replace electrolytes PRN - Avoid nephrotoxic agents as able, ensure adequate renal perfusion - Consult nephrology if iHD or CRRT indicated  - Obtain urine lytes - consider renal US  Acute Metabolic Encephalopathy in the setting of Acute Kidney Injury -Query Acute CVA?  PMHx: CVA, cerebral arterial thrombosis on warfarin CTH wo contrast negative for intracranial abnormality. More responsive today, no focal deficits noted on exam. Patient's wife recently died, unclear how the patient has been handling this loss. - Cancel MRI given significant improvement on mentation, reassess once extubated. - Restart anticoagulation, currently INR at 2.4, pharmacy consulting - Q 4 neuro checks - supportive care - UDS negative  Elevated Troponin due to demand ischemia  Suspected Cardiomyopathy  RBBB, bi-atrial enlargement & borderline LAFB noted on 12 lead. Favor demand  ischemia,  - trend troponin, has declined quickly - continuous cardiac monitoring - echocardiogram ordered, pending  Uncontrolled Type 2 Diabetes Mellitus Hemoglobin A1C: pending - Monitor CBG Q 4 hours - SSI resistant dosing, home regimen on hold  - target range while in ICU: 140-180 - follow ICU hyper/hypo-glycemia protocol  Hyperlipidemia Hypertension - home regimen on hold in the setting of rhabdomyolysis & marginal BP  Sacral  Pressure injury & scattered abrasions Erythrasma - present on admission, found down on kitchen floor at home.  - Per brother patient has issues with mobility and uses a walker/cane at home to get around. - WOC consulted, appreciate input - follow standing skin care orders - Turn and reposition Q 2 - Clarithromycin for erythrasma - Gearhart's Butt balm  Best Practice (right click and "Reselect all SmartList Selections" daily)  Diet/type: NPO DVT prophylaxis: prophylactic heparin  GI prophylaxis: PPI Lines: N/A Foley:  Yes, and it is still needed Code Status:  full code Last date of multidisciplinary goals of care discussion 07/06/21  Spoke with the patient's brother, Manuela NeptuneJimmy Parkhurst, bedside. Patient's wife died 1. 5 weeks ago. Patient has 2 children: son who is estranged and his daughter who lives in [place](apparently not legally recognized). Brother & sister are local. 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  Palliative care has been consulted and appreciate input.  Labs   CBC: Recent Labs  Lab 07/05/21 2033 07/05/21 2241 07/06/21 0421  WBC 13.7* 15.1* 11.8*  NEUTROABS 11.1*  --   --   HGB 17.6* 16.3 16.3  HCT 52.0 49.3 49.2  MCV 101.8* 103.4* 103.6*  PLT 278 213 194     Basic Metabolic Panel: Recent Labs  Lab 07/05/21 2033 07/05/21 2241 07/06/21 0421  NA 140  --  142  K 5.1  --  4.0  CL 101  --  109  CO2 25  --  24  GLUCOSE 472*  --  327*  BUN 133*  --  118*  CREATININE 5.10* 4.38* 3.63*  CALCIUM 9.7  --  8.9  MG  --   --  2.6*  PHOS  --   --  5.0*    GFR: Estimated Creatinine Clearance: 16.7 mL/min (A) (by C-G formula based on SCr of 3.63 mg/dL (H)). Recent Labs  Lab 07/05/21 2033 07/05/21 2241 07/06/21 0142 07/06/21 0421  WBC 13.7* 15.1*  --  11.8*  LATICACIDVEN 2.8* 3.6* 2.3* 2.4*     Liver Function Tests: Recent  Labs  Lab 07/05/21 2033  AST 78*  ALT 43  ALKPHOS 46  BILITOT 1.8*  PROT 6.6  ALBUMIN 3.0*    No results for input(s): LIPASE, AMYLASE in the last 168 hours. No results for input(s): AMMONIA in the last 168 hours.  ABG    Component Value Date/Time   HCO3 27.3 07/05/2021 2034   ACIDBASEDEF 0.1 07/05/2021 2034   O2SAT 97.0 07/05/2021 2034      Coagulation Profile: Recent Labs  Lab 07/05/21 2033 07/06/21 0421  INR 2.2* 2.4*     Cardiac Enzymes: Recent Labs  Lab 07/05/21 2033 07/06/21 0421  CKTOTAL 3,225* 7,798*     HbA1C: Hemoglobin A1C  Date/Time Value Ref Range Status  10/25/2014 04:15 AM 8.3 (H) 4.2 - 6.3 % Final    Comment:    The American Diabetes Association recommends that a primary goal of therapy  should be <7% and that physicians should reevaluate the treatment regimen in patients with HbA1c values consistently >8%.     CBG: Recent Labs  Lab 07/05/21 2359 07/06/21 0345 07/06/21 0725 07/06/21 0949  GLUCAP 346* 287* 261* 156*    Review of Systems:   Patient intubated, unable to participate in interview    Allergies Allergies  Allergen Reactions   Darvon [Propoxyphene] Hives   Glipizide Other (See Comments)   Saxagliptin Other (See Comments)    Scheduled Meds:  Chlorhexidine Gluconate Cloth  6 each Topical Daily   collagenase  1 application Topical Daily   docusate  100 mg Per Tube BID   insulin aspart  0-20 Units Subcutaneous Q4H   insulin glargine-yfgn  8 Units Subcutaneous Daily   pantoprazole (PROTONIX) IV  40 mg Intravenous QHS   polyethylene glycol  17 g Per Tube Daily   Continuous Infusions:  sodium chloride 150 mL/hr at 07/06/21 0942   ampicillin-sulbactam (UNASYN) IV Stopped (07/06/21 0242)   PRN Meds:.albuterol, docusate sodium, fentaNYL (SUBLIMAZE) injection, midazolam, midazolam, polyethylene glycol   Critical care time: 45 minutes    The patient is critically ill with multiple organ systems failure and  requires high complexity decision making for assessment and support, frequent evaluation and titration of therapies, application of advanced monitoring technologies and extensive interpretation of multiple databases.   Multidisciplinary rounds were performed with ICU team.  Gailen Shelter, MD Advanced Bronchoscopy PCCM [facility]    *This note was dictated using voice recognition software/Dragon.  Despite best efforts to proofread, errors can occur which can change the meaning.  Any change was purely unintentional.

## 2021-07-06 NOTE — ED Notes (Signed)
Non violent mitts applied to patients bilateral hands to prevent removal of medical equipment.

## 2021-07-06 NOTE — ED Notes (Signed)
OG advanced from 63cm to 70cm taped at the lips.

## 2021-07-06 NOTE — Progress Notes (Addendum)
ANTICOAGULATION CONSULT NOTE  Pharmacy Consult for warfarin Indication: h/o cerebral arterial thrombosis  Allergies  Allergen Reactions   Darvon [Propoxyphene] Hives   Glipizide Other (See Comments)   Saxagliptin Other (See Comments)    Patient Measurements: Height: 6' 2.02" (188 cm) Weight: 91.6 kg (201 lb 14.4 oz) IBW/kg (Calculated) : 82.24  Vital Signs: Temp: 97.9 F (36.6 C) (07/26 1200) Temp Source: Bladder (07/26 1200) BP: 113/59 (07/26 1200) Pulse Rate: 94 (07/26 1200)  Labs: Recent Labs    07/05/21 2033 07/05/21 2241 07/06/21 0219 07/06/21 0421  HGB 17.6* 16.3  --  16.3  HCT 52.0 49.3  --  49.2  PLT 278 213  --  194  APTT 28  --   --   --   LABPROT 24.0*  --   --  25.8*  INR 2.2*  --   --  2.4*  CREATININE 5.10* 4.38*  --  3.63*  CKTOTAL 3,225*  --   --  7,798*  TROPONINIHS 201* 229* 66*  --     Estimated Creatinine Clearance: 16.7 mL/min (A) (by C-G formula based on SCr of 3.63 mg/dL (H)).   Medical History: Past Medical History:  Diagnosis Date   Cerebral arterial thrombosis 08/09/2011   Cerebrovascular accident, old 10/27/2015   Diabetes mellitus without complication (HCC)    Metformin   Essential (primary) hypertension 09/25/2014   HLD (hyperlipidemia) 10/27/2015   Hypertension    Nephrolithiasis    Pure hypercholesterolemia 09/25/2014   Type 2 diabetes mellitus (HCC) 09/25/2014     Assessment: 87 year old male on warfarin for h/o cerebral arterial thrombosis. Home dose of warfarin 5 mg daily. Patient presented after having been found down at his home; he was briefly intubated. Pharmacy consult to restart warfarin.  Goal of Therapy:  INR 2-3 Monitor platelets by anticoagulation protocol: Yes   Plan:  INR 2.2 > 2.4. Patient is pending RN swallow evaluation following extubation. If he passes, will give warfarin 2.5 mg tonight. This is reduced from his home dose given slight bump in INR and interaction with clarithromycin. Check INR with  morning labs.   Pricilla Riffle, PharmD, BCPS 07/06/2021,2:57 PM

## 2021-07-06 NOTE — Progress Notes (Signed)
Pt was suctioned for a small amount of thick yellow secretions. Per Dr. Georgann Housekeeper order, he was extubated without incident. He is voicing and is without stridor. He was placed on a 2 L nasal cannula.

## 2021-07-06 NOTE — Consult Note (Signed)
Pharmacy Antibiotic Note  Gabriel [name]. is a 87 y.o. male admitted on 07/05/2021 with aspiration pneumonia.    Pharmacy has been consulted for Unasyn dosing.  Plan: Will start Unasyn 3g q12h  Height: 6' 2.02" (188 cm) Weight: 91.6 kg (201 lb 14.4 oz) IBW/kg (Calculated) : 82.24  Temp (24hrs), Avg:97.7 F (36.5 C), Min:96.8 F (36 C), Max:100.2 F (37.9 C)  Recent Labs  Lab 07/05/21 2033 07/05/21 2241  WBC 13.7* 15.1*  CREATININE 5.10* 4.38*  LATICACIDVEN 2.8* 3.6*    Estimated Creatinine Clearance: 13.8 mL/min (A) (by C-G formula based on SCr of 4.38 mg/dL (H)).    Allergies  Allergen Reactions   Darvon [Propoxyphene] Hives   Glipizide Other (See Comments)   Saxagliptin Other (See Comments)    Antimicrobials this admission: Cefepime/Vanc/Flagyl x 1 Azithromycin x 1 Unasyn 7/26 >>   Dose adjustments this admission: N/A  Microbiology results: 7/25 BCx: pending 7/25 UCx: pending   7/26 MRSA PCR: pending COVID/FLU NEG  Thank you for allowing pharmacy to be a part of this patient's care.  Albina Billet, PharmD, BCPS Clinical Pharmacist 07/06/2021 1:54 AM

## 2021-07-07 DIAGNOSIS — R4182 Altered mental status, unspecified: Secondary | ICD-10-CM | POA: Diagnosis not present

## 2021-07-07 DIAGNOSIS — Z7189 Other specified counseling: Secondary | ICD-10-CM | POA: Diagnosis not present

## 2021-07-07 DIAGNOSIS — G9341 Metabolic encephalopathy: Secondary | ICD-10-CM | POA: Diagnosis not present

## 2021-07-07 DIAGNOSIS — Z515 Encounter for palliative care: Secondary | ICD-10-CM | POA: Diagnosis not present

## 2021-07-07 DIAGNOSIS — M6282 Rhabdomyolysis: Secondary | ICD-10-CM | POA: Diagnosis not present

## 2021-07-07 DIAGNOSIS — E1165 Type 2 diabetes mellitus with hyperglycemia: Secondary | ICD-10-CM

## 2021-07-07 DIAGNOSIS — N179 Acute kidney failure, unspecified: Secondary | ICD-10-CM | POA: Diagnosis not present

## 2021-07-07 LAB — HEMOGLOBIN A1C
Hgb A1c MFr Bld: 8.8 % — ABNORMAL HIGH (ref 4.8–5.6)
Mean Plasma Glucose: 206 mg/dL

## 2021-07-07 LAB — CBC WITH DIFFERENTIAL/PLATELET
Abs Immature Granulocytes: 0.09 10*3/uL — ABNORMAL HIGH (ref 0.00–0.07)
Basophils Absolute: 0 10*3/uL (ref 0.0–0.1)
Basophils Relative: 0 %
Eosinophils Absolute: 0 10*3/uL (ref 0.0–0.5)
Eosinophils Relative: 0 %
HCT: 48.9 % (ref 39.0–52.0)
Hemoglobin: 16.7 g/dL (ref 13.0–17.0)
Immature Granulocytes: 1 %
Lymphocytes Relative: 5 %
Lymphs Abs: 0.5 10*3/uL — ABNORMAL LOW (ref 0.7–4.0)
MCH: 34.4 pg — ABNORMAL HIGH (ref 26.0–34.0)
MCHC: 34.2 g/dL (ref 30.0–36.0)
MCV: 100.8 fL — ABNORMAL HIGH (ref 80.0–100.0)
Monocytes Absolute: 1.2 10*3/uL — ABNORMAL HIGH (ref 0.1–1.0)
Monocytes Relative: 12 %
Neutro Abs: 8 10*3/uL — ABNORMAL HIGH (ref 1.7–7.7)
Neutrophils Relative %: 82 %
Platelets: 176 10*3/uL (ref 150–400)
RBC: 4.85 MIL/uL (ref 4.22–5.81)
RDW: 13 % (ref 11.5–15.5)
WBC: 9.9 10*3/uL (ref 4.0–10.5)
nRBC: 0 % (ref 0.0–0.2)

## 2021-07-07 LAB — BASIC METABOLIC PANEL WITH GFR
Anion gap: 5 (ref 5–15)
BUN: 80 mg/dL — ABNORMAL HIGH (ref 8–23)
CO2: 25 mmol/L (ref 22–32)
Calcium: 8.9 mg/dL (ref 8.9–10.3)
Chloride: 118 mmol/L — ABNORMAL HIGH (ref 98–111)
Creatinine, Ser: 1.41 mg/dL — ABNORMAL HIGH (ref 0.61–1.24)
GFR, Estimated: 48 mL/min — ABNORMAL LOW
Glucose, Bld: 157 mg/dL — ABNORMAL HIGH (ref 70–99)
Potassium: 3.7 mmol/L (ref 3.5–5.1)
Sodium: 148 mmol/L — ABNORMAL HIGH (ref 135–145)

## 2021-07-07 LAB — URINE CULTURE: Culture: NO GROWTH

## 2021-07-07 LAB — GLUCOSE, CAPILLARY
Glucose-Capillary: 169 mg/dL — ABNORMAL HIGH (ref 70–99)
Glucose-Capillary: 199 mg/dL — ABNORMAL HIGH (ref 70–99)
Glucose-Capillary: 273 mg/dL — ABNORMAL HIGH (ref 70–99)
Glucose-Capillary: 198 mg/dL — ABNORMAL HIGH (ref 70–99)

## 2021-07-07 LAB — PROTIME-INR
INR: 3.3 — ABNORMAL HIGH (ref 0.8–1.2)
Prothrombin Time: 33.6 seconds — ABNORMAL HIGH (ref 11.4–15.2)

## 2021-07-07 LAB — CK
Total CK: 2952 U/L — ABNORMAL HIGH (ref 49–397)
Total CK: 1973 U/L — ABNORMAL HIGH (ref 49–397)
Total CK: 2024 U/L — ABNORMAL HIGH (ref 49–397)

## 2021-07-07 LAB — MAGNESIUM: Magnesium: 2.4 mg/dL (ref 1.7–2.4)

## 2021-07-07 LAB — PHOSPHORUS: Phosphorus: 2.7 mg/dL (ref 2.5–4.6)

## 2021-07-07 LAB — TROPONIN I (HIGH SENSITIVITY): Troponin I (High Sensitivity): 88 ng/L — ABNORMAL HIGH (ref <18)

## 2021-07-07 MED ORDER — ADULT MULTIVITAMIN W/MINERALS CH
1.0000 | ORAL_TABLET | Freq: Every day | ORAL | Status: DC
  Administered 2021-07-08 – 2021-07-15 (×8): 1 via ORAL
  Filled 2021-07-07 (×8): qty 1

## 2021-07-07 MED ORDER — DOCUSATE SODIUM 100 MG PO CAPS
100.0000 mg | ORAL_CAPSULE | Freq: Two times a day (BID) | ORAL | Status: DC
  Administered 2021-07-07 – 2021-07-15 (×11): 100 mg via ORAL
  Filled 2021-07-07 (×13): qty 1

## 2021-07-07 MED ORDER — NEPRO/CARBSTEADY PO LIQD
237.0000 mL | Freq: Two times a day (BID) | ORAL | Status: DC
  Administered 2021-07-08 – 2021-07-15 (×13): 237 mL via ORAL

## 2021-07-07 MED ORDER — INSULIN ASPART 100 UNIT/ML IJ SOLN
2.0000 [IU] | Freq: Three times a day (TID) | INTRAMUSCULAR | Status: DC
  Administered 2021-07-08 – 2021-07-15 (×23): 2 [IU] via SUBCUTANEOUS
  Filled 2021-07-07 (×23): qty 1

## 2021-07-07 MED ORDER — ASPIRIN EC 81 MG PO TBEC
81.0000 mg | DELAYED_RELEASE_TABLET | Freq: Every day | ORAL | Status: DC
  Administered 2021-07-07 – 2021-07-15 (×9): 81 mg via ORAL
  Filled 2021-07-07 (×9): qty 1

## 2021-07-07 MED ORDER — ATORVASTATIN CALCIUM 20 MG PO TABS: 40.0000 mg | ORAL_TABLET | Freq: Every day | ORAL | Status: DC

## 2021-07-07 MED ORDER — INSULIN ASPART 100 UNIT/ML IJ SOLN
0.0000 [IU] | Freq: Every day | INTRAMUSCULAR | Status: DC
Start: 2021-07-07 — End: 2021-07-15
  Administered 2021-07-08: 2 [IU] via SUBCUTANEOUS
  Administered 2021-07-09: 3 [IU] via SUBCUTANEOUS
  Administered 2021-07-12: 2 [IU] via SUBCUTANEOUS
  Filled 2021-07-07 (×4): qty 1

## 2021-07-07 MED ORDER — INSULIN GLARGINE-YFGN 100 UNIT/ML ~~LOC~~ SOLN
12.0000 [IU] | Freq: Every day | SUBCUTANEOUS | Status: DC
  Administered 2021-07-08 – 2021-07-15 (×8): 12 [IU] via SUBCUTANEOUS
  Filled 2021-07-07 (×9): qty 0.12

## 2021-07-07 MED ORDER — ASCORBIC ACID 500 MG PO TABS
500.0000 mg | ORAL_TABLET | Freq: Every day | ORAL | Status: DC
  Administered 2021-07-07 – 2021-07-15 (×9): 500 mg via ORAL
  Filled 2021-07-07 (×9): qty 1

## 2021-07-07 MED ORDER — INSULIN ASPART 100 UNIT/ML IJ SOLN: 3.0000 [IU] | Freq: Three times a day (TID) | INTRAMUSCULAR | Status: DC

## 2021-07-07 MED ORDER — METOPROLOL SUCCINATE ER 25 MG PO TB24
25.0000 mg | ORAL_TABLET | Freq: Two times a day (BID) | ORAL | Status: DC
  Administered 2021-07-07 – 2021-07-15 (×15): 25 mg via ORAL
  Filled 2021-07-07 (×17): qty 1

## 2021-07-07 MED ORDER — SODIUM CHLORIDE 0.9 % IV SOLN
3.0000 g | Freq: Four times a day (QID) | INTRAVENOUS | Status: AC
  Administered 2021-07-07 – 2021-07-13 (×27): 3 g via INTRAVENOUS
  Filled 2021-07-07: qty 8
  Filled 2021-07-07: qty 3
  Filled 2021-07-07: qty 8
  Filled 2021-07-07 (×2): qty 3
  Filled 2021-07-07 (×3): qty 8
  Filled 2021-07-07: qty 3
  Filled 2021-07-07: qty 8
  Filled 2021-07-07 (×4): qty 3
  Filled 2021-07-07 (×3): qty 8
  Filled 2021-07-07: qty 3
  Filled 2021-07-07: qty 8
  Filled 2021-07-07 (×2): qty 3
  Filled 2021-07-07 (×3): qty 8
  Filled 2021-07-07: qty 3
  Filled 2021-07-07: qty 8
  Filled 2021-07-07: qty 3
  Filled 2021-07-07: qty 8

## 2021-07-07 MED ORDER — TAMSULOSIN HCL 0.4 MG PO CAPS
0.4000 mg | ORAL_CAPSULE | Freq: Every day | ORAL | Status: DC
  Administered 2021-07-07 – 2021-07-15 (×9): 0.4 mg via ORAL
  Filled 2021-07-07 (×9): qty 1

## 2021-07-07 MED ORDER — CLARITHROMYCIN 500 MG PO TABS
500.0000 mg | ORAL_TABLET | Freq: Two times a day (BID) | ORAL | Status: DC
  Filled 2021-07-07 (×2): qty 1

## 2021-07-07 MED ORDER — VITAMIN E 45 MG (100 UNIT) PO CAPS
400.0000 [IU] | ORAL_CAPSULE | Freq: Every day | ORAL | Status: DC
  Administered 2021-07-07 – 2021-07-15 (×9): 400 [IU] via ORAL
  Filled 2021-07-07 (×9): qty 4

## 2021-07-07 MED ORDER — INSULIN ASPART 100 UNIT/ML IJ SOLN
0.0000 [IU] | Freq: Three times a day (TID) | INTRAMUSCULAR | Status: DC
  Administered 2021-07-07: 11 [IU] via SUBCUTANEOUS
  Administered 2021-07-07: 4 [IU] via SUBCUTANEOUS
  Administered 2021-07-08: 3 [IU] via SUBCUTANEOUS
  Administered 2021-07-08: 4 [IU] via SUBCUTANEOUS
  Administered 2021-07-08: 11 [IU] via SUBCUTANEOUS
  Administered 2021-07-09 (×2): 7 [IU] via SUBCUTANEOUS
  Administered 2021-07-09: 15 [IU] via SUBCUTANEOUS
  Administered 2021-07-10: 11 [IU] via SUBCUTANEOUS
  Administered 2021-07-10 (×2): 7 [IU] via SUBCUTANEOUS
  Administered 2021-07-11 (×2): 11 [IU] via SUBCUTANEOUS
  Administered 2021-07-11: 4 [IU] via SUBCUTANEOUS
  Administered 2021-07-12: 3 [IU] via SUBCUTANEOUS
  Administered 2021-07-12 (×2): 4 [IU] via SUBCUTANEOUS
  Administered 2021-07-13: 3 [IU] via SUBCUTANEOUS
  Administered 2021-07-13: 4 [IU] via SUBCUTANEOUS
  Administered 2021-07-13: 3 [IU] via SUBCUTANEOUS
  Administered 2021-07-14 (×2): 4 [IU] via SUBCUTANEOUS
  Administered 2021-07-14 – 2021-07-15 (×2): 3 [IU] via SUBCUTANEOUS
  Administered 2021-07-15: 4 [IU] via SUBCUTANEOUS
  Filled 2021-07-07 (×19): qty 1

## 2021-07-07 MED ORDER — PANTOPRAZOLE SODIUM 40 MG PO TBEC
40.0000 mg | DELAYED_RELEASE_TABLET | Freq: Every day | ORAL | Status: DC
  Administered 2021-07-07 – 2021-07-14 (×8): 40 mg via ORAL
  Filled 2021-07-07 (×8): qty 1

## 2021-07-07 MED ORDER — DEXTROSE 5 % IV SOLN: INTRAVENOUS | Status: DC

## 2021-07-07 NOTE — Evaluation (Signed)
Clinical/Bedside Swallow Evaluation Patient Details  Name: Gabriel [name]. MRN: [id number] Date of Birth: February 08, 1932  Today's Date: 07/07/2021 Time: SLP Start Time (ACUTE ONLY): 0910 SLP Stop Time (ACUTE ONLY): 1005 SLP Time Calculation (min) (ACUTE ONLY): 55 min  Past Medical History:  Past Medical History:  Diagnosis Date   Cerebral arterial thrombosis 08/09/2011   Cerebrovascular accident, old 10/27/2015   Diabetes mellitus without complication (HCC)    Metformin   Essential (primary) hypertension 09/25/2014   HLD (hyperlipidemia) 10/27/2015   Hypertension    Nephrolithiasis    Pure hypercholesterolemia 09/25/2014   Type 2 diabetes mellitus (HCC) 09/25/2014   Past Surgical History:  Past Surgical History:  Procedure Laterality Date   CHOLECYSTECTOMY     CYSTOSCOPY WITH STENT PLACEMENT Left 11/02/2015   Procedure: CYSTOSCOPY WITH STENT PLACEMENT;  Surgeon: Vanna Scotland, MD;  Location: [facility] ORS;  Service: Urology;  Laterality: Left;   TONSILLECTOMY     URETEROSCOPY WITH HOLMIUM LASER LITHOTRIPSY Left 11/02/2015   Procedure: URETEROSCOPY WITH HOLMIUM LASER LITHOTRIPSY;  Surgeon: Vanna Scotland, MD;  Location: [facility] ORS;  Service: Urology;  Laterality: Left;   HPI:  87 yo male presenting to [facility] ED via EMS after being found on the ground of his home unresponsive by police during a wellness check.  Per ED documentation and police report they were called to the patient's house for a welfare check possibly by a neighbor, and that no one had seen him for roughly 4 days.  When police arrived they found the patient down on the ground in the kitchen covered in feces and insects. Patient intubated emergently and placed on mechanical ventilation due to inability to protect airway in unresponsive state.  Sepsis protocol initiated and patient received 3.5 L of IV fluid resuscitation and empiric antibiotic coverage.  CXR reveals LLL atelectasis versus developing infiltrate.  Pt was extubated on  07/06/2021.   Assessment / Plan / Recommendation Clinical Impression  Pt appears to present w/ oropharyngeal phase dysphagia d/t declined Cognitive status and poor attention during swallowing tasks impacting his overall awareness/engagement w/ po tasks which increases risk for aspiration. Overt s/s of aspiration noted during trials of thin liquids. Pt's risk for aspiration is present but reduced when following general aspiration precautions and using a modified diet consistency at this time. Pt is also not wearing his Dentures(at home?) and requires Full support w/ feeding d/t UE weakness bilat. He required mod-max oral, visual/tactile/verbal cues for orientation to bolus presentation w/ feeding support.   Pt consumed trials of purees (w/ sips of Nectar liquids via straw/cup prior) w/ No overt clinical s/s of aspiration noted; no decline in phonations, no cough, and no decline in respiratory status during/post trials. O2 sats remained upper 90s. Oral phase was grossly [facility] for bolus management and oral clearing of the boluses given. During thin liquid trials, pt demonstrated declined attention to task resulting in oral phase deficits c/b reduced seal to cup and anterior spillage. During the pharyngeal phase, pt exibited decreased coordination of oropharyngeal swallowing w/ overt coughing and multiple swallows in attempts to clear boluses. O2 sats dipped into upper 80s x2 post trials(when coughing). Suspect related to potential aspiration. OM exam revealed No oral weakness; cough+ but congested. Pt required much support for feeding including holding Cup when drinking.    Recommend a Dysphagia level 1(puree) w/ Nectar liquids; general aspiration precautions; reduce Distractions during meals. Pills Crushed in Puree for safer swallowing. Support w/ feeding at meals -- check for oral clearing  during/post intake. MD/NSG updated. ST services will f/u w/ pt's status w/ trials to upgrade diet as medical and Cognitive  status' improve for safer oral intake. Recommend Pleasure, single ice chips w/ NSG Supervision post oral care following aspiration precautions.   SLP Visit Diagnosis: Dysphagia, oropharyngeal phase (R13.12)    Aspiration Risk  Mild aspiration risk;Moderate aspiration risk;Risk for inadequate nutrition/hydration    Diet Recommendation  ysphagia level 1(puree) w/ Nectar liquids; general aspiration precautions; reduce Distractions during meals. Support w/ feeding at meals -- check for oral clearing during/post intake.  Medication Administration: Crushed with puree (for safer swallowing)    Other  Recommendations Recommended Consults:  (Dietician f/u) Oral Care Recommendations: Oral care BID;Oral care before and after PO;Staff/trained caregiver to provide oral care Other Recommendations: Order thickener from pharmacy;Have oral suction available;Prohibited food (jello, ice cream, thin soups);Remove water pitcher   Follow up Recommendations Skilled Nursing facility (TBD)      Frequency and Duration min 3x week  2 weeks       Prognosis Prognosis for Safe Diet Advancement: Fair Barriers to Reach Goals: Cognitive deficits;Time post onset;Severity of deficits;Behavior      Swallow Study   General Date of Onset: 07/05/21 HPI: 87 yo male presenting [facility] ED via EMS after being found on the ground of his home unresponsive by police during a wellness check.  Per ED documentation and police report they were called to the patient's house for a welfare check possibly by a neighbor, and that no one had seen him for roughly 4 days.  When police arrived they found the patient down on the ground in the kitchen covered in feces and insects. Patient intubated emergently and placed on mechanical ventilation due to inability to protect airway in unresponsive state.  Sepsis protocol initiated and patient received 3.5 L of IV fluid resuscitation and empiric antibiotic coverage.  CXR reveals LLL atelectasis  versus developing infiltrate.  Pt was extubated on 07/06/2021. Type of Study: Bedside Swallow Evaluation Previous Swallow Assessment: none Diet Prior to this Study: NPO Temperature Spikes Noted: No (wbc 9.9) Respiratory Status: Room air History of Recent Intubation: Yes Length of Intubations (days): 2 days Date extubated: 07/06/21 Behavior/Cognition: Alert;Cooperative;Pleasant mood;Confused;Distractible;Requires cueing Oral Cavity Assessment: Dry (sticky) Oral Care Completed by SLP: Yes Oral Cavity - Dentition: Edentulous (states he wears dentures at home) Vision: Functional for self-feeding Self-Feeding Abilities: Needs assist;Needs set up;Total assist (weak UEs) Patient Positioning: Upright in bed (needed full positioning support upright in bed) Baseline Vocal Quality: Normal Volitional Cough: Strong;Congested Volitional Swallow: Able to elicit    Oral/Motor/Sensory Function Overall Oral Motor/Sensory Function: Within functional limits   Ice Chips Ice chips: Within functional limits Presentation: Spoon (fed; 5 trials)   Thin Liquid Thin Liquid: Impaired Presentation: Cup;Self Fed;Straw (supported; 7 trials) Oral Phase Impairments: Poor awareness of bolus (intermittent) Oral Phase Functional Implications:  (anterior spillage) Pharyngeal  Phase Impairments: Suspected delayed Swallow;Multiple swallows;Cough - Delayed Other Comments: appeared less coordinated    Nectar Thick Nectar Thick Liquid: Within functional limits Presentation: Self Fed;Straw (supported; ~3-4 ozs)   Honey Thick Honey Thick Liquid: Not tested   Puree Puree: Within functional limits Presentation: Spoon (fed; ~4 ozs)   Solid     Solid: Not tested        Jerilynn Som, MS, CCC-SLP Speech Language Pathologist Rehab Services [phone] Brycelyn Gambino 07/07/2021,12:49 PM

## 2021-07-07 NOTE — Progress Notes (Signed)
ANTICOAGULATION CONSULT NOTE  Pharmacy Consult for warfarin Indication: h/o cerebral arterial thrombosis  Patient Measurements: Height: 6' 2.02" (188 cm) Weight: 100 kg (220 lb 7.4 oz) IBW/kg (Calculated) : 82.24  Labs: Recent Labs    07/05/21 2033 07/05/21 2241 07/06/21 0219 07/06/21 0421 07/06/21 1754 07/07/21 0149  HGB 17.6* 16.3  --  16.3  --  16.7  HCT 52.0 49.3  --  49.2  --  48.9  PLT 278 213  --  194  --  176  APTT 28  --   --   --   --   --   LABPROT 24.0*  --   --  25.8*  --  33.6*  INR 2.2*  --   --  2.4*  --  3.3*  CREATININE 5.10* 4.38*  --  3.63*  --  1.41*  CKTOTAL 3,225*  --   --  7,798* 4,038* 2,952*  TROPONINIHS 201* 229* 66*  --   --   --      Estimated Creatinine Clearance: 46.6 mL/min (A) (by C-G formula based on SCr of 1.41 mg/dL (H)).   Medical History: Past Medical History:  Diagnosis Date   Cerebral arterial thrombosis 08/09/2011   Cerebrovascular accident, old 10/27/2015   Diabetes mellitus without complication (HCC)    Metformin   Essential (primary) hypertension 09/25/2014   HLD (hyperlipidemia) 10/27/2015   Hypertension    Nephrolithiasis    Pure hypercholesterolemia 09/25/2014   Type 2 diabetes mellitus (HCC) 09/25/2014    Assessment: 87 year old male on warfarin for h/o cerebral arterial thrombosis. Home dose of warfarin 5 mg daily. Patient presented after having been found down at his home; he was briefly intubated. Pharmacy consult to restart warfarin.  LFTs: Mild transaminitis on admission. Albumin 3. Tbili 1.8 DDI: Unasyn  Date INR Plan  7/25 2.2 Last dose unk.  7/26 2.4 Warfarin 2.5 mg  7/27 3.3 Hold    Goal of Therapy:  INR 2-3   Plan:  --INR 3.3 today. Supratherapeutic. Will hold warfarin administration tonight --Daily INR per protocol --CBC at least every 3 days per protocol  Tressie Ellis 07/07/2021,9:09 AM

## 2021-07-07 NOTE — Evaluation (Signed)
Occupational Therapy Evaluation Patient Details Name: Gabriel [name]. MRN: [id number] DOB: [birth year] Today's Date: 07/07/2021    History of Present Illness 87 yo male presenting to [facility] ED via EMS after being found on the ground of his home unresponsive by police during a wellness check.  Per ED documentation and police report they were called to the patient's house having been called for a welfare check-in, apparently no one had seen him in ~4 days and he was covered in feces and insects when they found him.  Pt intubated for 1 day.   Clinical Impression   Pt seen for OT evaluation this date in setting of acute hospitalization d/t fall and extended time down. Pt presents this date with gross weakness and is generally confused at this time as well. Limited hx provided, but chart indicates that pt lives by himself. Pt requires MOD A for bed level (high fowler's position) self-drinking with thickened liquids, d/t UE weakness (shld flexion limited to ~1/4 range). Pt requires MAX/TOTAL A for LB ADLs. Pt requires MAX A +2 for lateral rolling in bed as well, no other mobility able to be completed at time of OT eval as pt asking to be left alone and wanting to nap. Pt left with all needs met and in reach. RN notified of pt's elevated HR throughout session. Will continue to follow. Anticipate pt will require extensive rehabilitation efforts in STR setting.     Follow Up Recommendations  SNF    Equipment Recommendations  Other (comment) (defer to next venue of care)    Recommendations for Other Services       Precautions / Restrictions Precautions Precautions: Fall Restrictions Weight Bearing Restrictions: No      Mobility Bed Mobility Overal bed mobility: Needs Assistance Bed Mobility: Rolling Rolling: Max assist;+2 for physical assistance         General bed mobility comments: cues for hand placement    Transfers                 General transfer comment: not appropraite  or safe to attempt standing this date    Balance       Sitting balance - Comments: unable to assess on OT evaluation                                   ADL either performed or assessed with clinical judgement   ADL                                         General ADL Comments: requires MOD A for self-feeding/drinking d/t UE weakness, TOTAL A +2 for LB ADLs using lateral rolling technique in bed     Vision Patient Visual Report: No change from baseline Additional Comments: difficult to formally assess d/t cognition, appears to track     Perception     Praxis      Pertinent Vitals/Pain Pain Assessment: Faces Faces Pain Scale: Hurts little more Pain Location: general, pt unable to ascribe location or quality, but some grimacing with attempts to mobilize. Pain Intervention(s): Repositioned;Monitored during session     Hand Dominance     Extremity/Trunk Assessment Upper Extremity Assessment Upper Extremity Assessment: Generalized weakness (shld flexion to only ~1/4 range, unable to feed self.)   Lower Extremity Assessment Lower Extremity Assessment: Generalized  weakness       Communication Communication Communication: No difficulties   Cognition Arousal/Alertness: Awake/alert (drowsy) Behavior During Therapy: WFL for tasks assessed/performed Overall Cognitive Status: No family/caregiver present to determine baseline cognitive functioning                                 General Comments: Pt oriented to Gabriel [name] and self, with cues, he eventually was able to guess he was in the hospital. He was otherwise not oriented including no recollection of being in the hospital which he apparently did know during PT evaluation. He follows ~60-70% of simple commands, some limited attn potentially d/t fatigue/lethargy.   General Comments       Exercises Other Exercises Other Exercises: OT engaged pt in PROM/AAROM of UEs in available planes  from bed level with pt tolerating increased shld flexion, but primarily limited d/t decreased strength.   Shoulder Instructions      Home Living Family/patient expects to be discharged to:: Skilled nursing facility Living Arrangements: Alone (per chart)                                      Prior Functioning/Environment Level of Independence: Independent with assistive device(s)        Comments: Pt reports that he still dirves and runs some errands, uses SPC and until last month was regularly using an exercise bike (?)  apprarently brother does check-in, assist at times when needed. Pt is questionable historian, oriented to self and month, but no other aspects        OT Problem List: Decreased strength;Decreased activity tolerance;Impaired balance (sitting and/or standing);Decreased cognition;Decreased safety awareness;Decreased knowledge of use of DME or AE;Cardiopulmonary status limiting activity;Increased edema      OT Treatment/Interventions: Self-care/ADL training;Therapeutic exercise;DME and/or AE instruction;Therapeutic activities;Balance training;Patient/family education;Manual therapy;Energy conservation    OT Goals(Current goals can be found in the care plan section) Acute Rehab OT Goals Patient Stated Goal: to go home OT Goal Formulation: With patient Time For Goal Achievement: 07/21/21 Potential to Achieve Goals: Fair ADL Goals Pt Will Perform Eating: with set-up;with supervision;sitting (to consume ~25% of meal/drink (supported sitting)) Pt Will Perform Grooming: with min assist;with mod assist;sitting (unsupported EOB sitting to compelte 1-2 g/h tasks to imprpove tolerance) Pt Will Transfer to Toilet: with max assist;with +2 assist;bedside commode Pt/caregiver will Perform Home Exercise Program: Increased strength;Both right and left upper extremity;With minimal assist Additional ADL Goal #1: Pt will tolerate further mobilization/OT assessment to allow  for more accurate POC development (at least MOD A sup to sit to improve OOB tolerance/sitting fxl activity tolerance).  OT Frequency: Min 2X/week   Barriers to D/C: Decreased caregiver support  lives alone       Co-evaluation              AM-PAC OT "6 Clicks" Daily Activity     Outcome Measure Help from another person eating meals?: A Little Help from another person taking care of personal grooming?: A Lot Help from another person toileting, which includes using toliet, bedpan, or urinal?: Total Help from another person bathing (including washing, rinsing, drying)?: Total Help from another person to put on and taking off regular upper body clothing?: A Lot Help from another person to put on and taking off regular lower body clothing?: Total 6 Click Score: 10   End of  Session Equipment Utilized During Treatment: Oxygen Nurse Communication: Mobility status (HR elevated throughout, RN already aware)  Activity Tolerance: Patient limited by fatigue;Treatment limited secondary to medical complications (Comment) Patient left: in bed;with call bell/phone within reach;with bed alarm set  OT Visit Diagnosis: Other abnormalities of gait and mobility (R26.89);Muscle weakness (generalized) (M62.81);History of falling (Z91.81)                Time: 1252-7129 OT Time Calculation (min): 22 min Charges:  OT General Charges $OT Visit: 1 Visit OT Evaluation $OT Eval Moderate Complexity: 1 Mod OT Treatments $Self Care/Home Management : 8-22 mins  Gerrianne Scale, MS, OTR/L ascom [phone] 07/07/21, 6:49 PM

## 2021-07-07 NOTE — Progress Notes (Addendum)
Daily Progress Note   Patient Name: Gabriel [name].       Date: 07/07/2021 DOB: Feb 19, 1932  Age: 87 y.o. MRN#: [id number] Attending Physician: Gabriel Saner, MD Primary Care Physician: Gabriel Housekeeper, MD Admit Date: 07/05/2021  Reason for Consultation/Follow-up: Establishing goals of care  Subjective: Patient is sitting in bed, extubated. No family at bedside. He is somewhat confused. He is able to state his name, that we are in [name] office. He cannot tell me the current president, but states the previous one was Gabriel [name]. He cannot tell me the year.   He states he lives alone. ED notes have been reviewed regarding being found down in his kitchen by Gabriel [name]; he tells me he was in his barn loft and upon standing to close a cedar chest, he lost his balance and fell. He states he did not have his phone to call for help. He states he laid there for 2 days. He tells me he crawled out of the barn and his neighbor found him and called for help.   He states he is widowed as of 1 year ago. Inquired about children as brother stated yesterday he had 2, but no contact information for them. Patient tells me he has 3 children. He states one is Gabriel [name] who lives in [place], who he states he does not have a relationship with. The second is Gabriel [name], who he states is a Librarian, academic and lives in [place], that he talks to on the phone. He states he has a third daughter Gabriel [name] who is a retired Financial controller for [employer] that lives in [place]. He is not able to supply any further information at this time.   He states he believes he would want his brother Gabriel [name] to be his surrogate decision maker if he is unable to make decisions, as "Gabriel [name]'s got a good head". Upon attempting  to broach GOC to get a sense of his feelings, his answers were inconsistent upon asking the same question a second time later in the conversation.   Hopeful his mental status will improve such that HPOA papers can be completed, and a GOC conversation can occur with him.       Length of Stay: 2  Current Medications: Scheduled Meds:  . vitamin C  500 mg Oral Daily  . aspirin EC  81 mg Oral Daily  . Chlorhexidine Gluconate Cloth  6 each Topical Daily  . collagenase  1 application Topical Daily  . docusate sodium  100 mg Oral BID  . feeding supplement (NEPRO CARB STEADY)  237 mL Oral BID BM  . Gerhardt's butt cream   Topical BID  . insulin aspart  0-20 Units Subcutaneous TID WC  . insulin aspart  0-5 Units Subcutaneous QHS  . insulin glargine-yfgn  8 Units Subcutaneous Daily  . metoprolol succinate  25 mg Oral BID  . [START ON 07/08/2021] multivitamin with minerals  1 tablet Oral Daily  . pantoprazole  40 mg Oral QHS  . tamsulosin  0.4 mg Oral Daily  . vitamin E  400 Units Oral Daily  . Warfarin - Pharmacist Dosing Inpatient   Does not apply q1600    Continuous Infusions: . ampicillin-sulbactam (UNASYN) IV 3 g (07/07/21 1127)  . dextrose 50 mL/hr at 07/07/21 1124    PRN Meds: albuterol, docusate sodium, polyethylene glycol  Physical Exam Pulmonary:     Effort: Pulmonary effort is normal.  Skin:    General: Skin is warm and dry.  Neurological:     Mental Status: He is alert.            Vital Signs: BP (!) 141/64   Pulse (!) 106   Temp 99.32 F (37.4 C)   Resp (!) 33   Ht 6' 2.02" (1.88 m)   Wt 100 kg   SpO2 99%   BMI 28.29 kg/m  SpO2: SpO2: 99 % O2 Device: O2 Device: Nasal Cannula O2 Flow Rate:    Intake/output summary:  Intake/Output Summary (Last 24 hours) at 07/07/2021 1208 Last data filed at 07/07/2021 0800 Gross per 24 hour  Intake 1119.09 ml  Output 2190 ml  Net -1070.91 ml   LBM: Last BM Date:  (PTA) Baseline Weight: Weight: 109 kg Most recent  weight: Weight: 100 kg        Patient Active Problem List   Diagnosis Date Noted  . On mechanically assisted ventilation (HCC) 07/06/2021  . Uncontrolled type 2 diabetes mellitus (HCC) 07/06/2021  . Acute metabolic encephalopathy 07/06/2021  . Acute kidney injury (HCC) 07/06/2021  . Pressure injury of skin of sacral region 07/06/2021  . Altered mental status   . Elevated CK   . Acute respiratory failure (HCC) 07/05/2021  . HLD (hyperlipidemia) 10/27/2015  . Cerebrovascular accident, old 10/27/2015  . Essential (primary) hypertension 09/25/2014  . Pure hypercholesterolemia 09/25/2014  . Type 2 diabetes mellitus (HCC) 09/25/2014  . Cerebral arterial thrombosis 08/09/2011  . Long term current use of anticoagulant 08/09/2011    Palliative Care Assessment & Plan     Recommendations/Plan: Patient extubated. He states he has 3 children, and not 2. He is unable to give any contact details. He does state he would want his brother Gabriel [name] to be his HPOA.  Hopefully his confusion will improve such that he is able to have his own GOC conversation and complete HPOA papers.    Code Status:    Code Status Orders  (From admission, onward)           Start     Ordered   07/05/21 2156  Full code  Continuous        07/05/21 2157           Code Status History     This patient has a current code status but no  historical code status.         Thank you for allowing the Palliative Medicine Team to assist in the care of this patient.       Total Time 25 min Prolonged Time Billed  no       Greater than 50%  of this time was spent counseling and coordinating care related to the above assessment and plan.  Morton Stall, NP  Please contact Palliative Medicine Team phone at [phone] for questions and concerns.

## 2021-07-07 NOTE — Progress Notes (Signed)
Physical Therapy Evaluation Patient Details Name: Gabriel [name]. MRN: [id number] DOB: 01/30/1932 Today's Date: 07/07/2021   History of Present Illness  87 yo male presenting to [facility] ED via EMS after being found on the ground of his home unresponsive by police during a wellness check.  Per ED documentation and police report they were called to the patient's house having been called for a welfare check-in, apparently no one had seen him in ~4 days and he was covered in feces and insects when they found him.  Pt intubated for 1 day.  Clinical Impression  Pt pleasant and willing to participate with PT but was functionally very weak and limited.  He needed heavy assist to attain sitting EOB, but was able to sit 3-4 minutes at times with only CGA but generally needing min/mod assist to keep from leaning backward.  Pt generally weak t/o, L more so then R (likely related to prior CVA); he lacked AROM in nearly all planes and despite good effort was very weak and limited with even very basic U&LE exercises.  Pt will require STR once medically ready for d/c, will benefit from continued PT to address functional limitations listed below.    Follow Up Recommendations SNF    Equipment Recommendations  Rolling walker with 5" wheels    Recommendations for Other Services       Precautions / Restrictions Precautions Precautions: Fall Restrictions Weight Bearing Restrictions: No      Mobility  Bed Mobility Overal bed mobility: Needs Assistance Bed Mobility: Supine to Sit;Sit to Supine     Supine to sit: Mod assist;Max assist Sit to supine: Max assist   General bed mobility comments: Pt able to show some effort with getting toward EOB, less so with attaining sitting.  Plenty of cuing to adjust/set up at EOB, but able to sit 3-4 minutes, needing min/mod assist much of the time but occasionally maintaining sitting balance briefly w/ only CGA with heavy cuing/encouragement on a few occasions     Transfers                 General transfer comment: not appropraite or safe to attempt standing this date  Ambulation/Gait                Stairs            Wheelchair Mobility    Modified Rankin (Stroke Patients Only)       Balance Overall balance assessment: Needs assistance Sitting-balance support: Bilateral upper extremity supported Sitting balance-Leahy Scale: Fair Sitting balance - Comments: breifly (10-15 seconds max) able to maintain EOB sitting w/o direct phyiscal assist, but losing balance backward consistently t/o the effort     Standing balance-Leahy Scale:  (unsafe/unable to attempt standing this date)                               Pertinent Vitals/Pain Pain Assessment:  (endorses general pain from laying on the floor and being in bed)    Home Living Family/patient expects to be discharged to:: Skilled nursing facility Living Arrangements: Alone                    Prior Function Level of Independence: Independent with assistive device(s)         Comments: Pt reports that he still dirves and runs some errands, uses [facility]PC and until last month was regularly using an exercise bike (?)  apprarently brother does check-in, assist at times when needed     Hand Dominance        Extremity/Trunk Assessment   Upper Extremity Assessment Upper Extremity Assessment: Generalized weakness (R UE with some against gravity elevation but grossly only 2+/5 t/o, L UE grossly 2-/5 with very little AROM in any joint/plane)    Lower Extremity Assessment Lower Extremity Assessment: Generalized weakness (similarly very weak b/l, R appears minimally stronger than L but both <3/5 with voluntary movements)       Communication   Communication: No difficulties  Cognition Arousal/Alertness: Awake/alert Behavior During Therapy: WFL for tasks assessed/performed Overall Cognitive Status: Difficult to assess                                  General Comments: Pt did know it was July and that he was in the hospital.  He did seem to have some insight, and was very ineractive and pleasant though generally was not fully oriented      General Comments      Exercises Other Exercises Other Exercises: performed some limited b/l  AA/PROM exercises in b/l LEs, instructed to do some AP, QS and h/s in available range, pt fatigued after sitting effort.   Assessment/Plan    PT Assessment Patient needs continued PT services  PT Problem List Decreased strength;Decreased range of motion;Decreased activity tolerance;Decreased balance;Decreased mobility;Decreased coordination;Decreased knowledge of use of DME;Decreased safety awareness;Cardiopulmonary status limiting activity;Pain       PT Treatment Interventions DME instruction;Stair training;Gait training;Functional mobility training;Therapeutic activities;Balance training;Therapeutic exercise;Cognitive remediation;Patient/family education    PT Goals (Current goals can be found in the Care Plan section)  Acute Rehab PT Goals Patient Stated Goal: get stronger and eventually go back home PT Goal Formulation: With patient Time For Goal Achievement: 07/21/21 Potential to Achieve Goals: Fair    Frequency Min 2X/week   Barriers to discharge        Co-evaluation               AM-PAC PT "6 Clicks" Mobility  Outcome Measure Help needed turning from your back to your side while in a flat bed without using bedrails?: A Lot Help needed moving from lying on your back to sitting on the side of a flat bed without using bedrails?: A Lot Help needed moving to and from a bed to a chair (including a wheelchair)?: Total Help needed standing up from a chair using your arms (e.g., wheelchair or bedside chair)?: Total Help needed to walk in hospital room?: Total Help needed climbing 3-5 steps with a railing? : Total 6 Click Score: 8    End of Session Equipment Utilized  During Treatment: Oxygen (2L, sats remained in the high 90s t/o the effort) Activity Tolerance: Patient limited by fatigue;Patient tolerated treatment well Patient left: in bed;with call bell/phone within reach Nurse Communication: Mobility status PT Visit Diagnosis: Muscle weakness (generalized) (M62.81);Difficulty in walking, not elsewhere classified (R26.2);History of falling (Z91.81)    Time: 7425-9563 PT Time Calculation (min) (ACUTE ONLY): 34 min   Charges:   PT Evaluation $PT Eval Low Complexity: 1 Low PT Treatments $Therapeutic Activity: 8-22 mins        Malachi Pro, DPT 07/07/2021, 9:46 AM

## 2021-07-07 NOTE — Consult Note (Signed)
Pharmacy Antibiotic Note  Gabriel [name]. is a 87 y.o. male admitted on 07/05/2021 with aspiration pneumonia. Patient found down at home unresponsive by police during wellness check. Patient intubated 7/25 and extubated 7/26. Pharmacy has been consulted for Unasyn dosing.  Plan:  Adjust Unasyn to 3 g IV q6h based on improving renal function  Height: 6' 2.02" (188 cm) Weight: 100 kg (220 lb 7.4 oz) IBW/kg (Calculated) : 82.24  Temp (24hrs), Avg:98.5 F (36.9 C), Min:97.88 F (36.6 C), Max:99.32 F (37.4 C)  Recent Labs  Lab 07/05/21 2033 07/05/21 2241 07/06/21 0142 07/06/21 0421 07/07/21 0149  WBC 13.7* 15.1*  --  11.8* 9.9  CREATININE 5.10* 4.38*  --  3.63* 1.41*  LATICACIDVEN 2.8* 3.6* 2.3* 2.4*  --      Estimated Creatinine Clearance: 46.6 mL/min (A) (by C-G formula based on SCr of 1.41 mg/dL (H)).    Allergies  Allergen Reactions   Darvon [Propoxyphene] Hives   Glipizide Other (See Comments)   Saxagliptin Other (See Comments)    Antimicrobials this admission: Cefepime/Vanc/Flagyl 7/25 x 1 Azithromycin 7/26 x 1 Clarithromycin 7/26 >> 7/26 Unasyn 7/26 >>  Microbiology results: 7/26 MRSA PCR: (-) 7/25 BCx: NGTD 7/25 UCx: NG  Thank you for allowing pharmacy to be a part of this patient's care.  Tressie Ellis 07/07/2021 11:40 AM

## 2021-07-07 NOTE — Progress Notes (Signed)
NAME:  Gabriel [name]., MRN:  [id number], DOB:  1932-07-07, LOS: 2 ADMISSION DATE:  07/05/2021, CONSULTATION DATE:  07/05/21 REFERRING MD:  Dr. Erma Heritage, CHIEF COMPLAINT:  Unresponsive  History of Present Illness:  87 yo male presenting to [facility] ED via EMS after being found on the ground of his home unresponsive by police during a wellness check.  Per ED documentation and police report they were called to the patient's house for a welfare check possibly by a neighbor, and that no one had seen him for roughly 4 days.  When police arrived they found the patient down on the ground in the kitchen covered in feces and insects. ED course:  Initial vitals: T-100.2, tachypneic at 36, tachycardic at 125, BP stable at 114/52 & SPO2 97% with bag-valve-mask support Significant labs: Hyperkalemia at 5.1, acute renal failure BUN/Cr-133/5.10, AST elevated at 78, total bilirubin elevated at 1.8, CK indicative of rhabdomyolysis at 3225, troponin elevated at 201> 229, lactic acidosis: 2.8> 3.6, mild leukocytosis with left shift at 13.7, hemoglobin elevated at 17.6.  CTh negative for acute intracranial abnormality.  CT cervical spine negative for fracture.  CXR reveals LLL atelectasis versus developing infiltrate.  UA not consistent with UTI.  Patient intubated emergently and placed on mechanical ventilation due to inability to protect airway in unresponsive state.  Sepsis protocol initiated and patient received 3.5 L of IV fluid resuscitation and empiric antibiotic coverage. PCCM consulted for admission and further management. Pertinent  Medical History  CVA Type 2 diabetes mellitus Hyperlipidemia Hypertension  Significant Hospital Events: Including procedures, antibiotic start and stop dates in addition to other pertinent events   07/05/2021-patient admitted to ICU after being found unresponsive at home requiring emergent intubation and mechanical ventilation in the ER.  Work-up for rhabdomyolysis and  sepsis  Interim History / Subjective:  Patient responsive to verbal stimuli but lethargic and inconsistent.  Able to open eyes without tracking or focus, with persistent stimulation able to give thumbs up in both hands to command and wiggle toes.  Possibly still holding onto intubation medications in the setting of acute renal failure.  Labs/ Imaging personally reviewed  EKG Interpretation Date: 07/07/2021 EKG Time: 20:09 Rate: 129 Rhythm: Sinus tachycardia QRS Axis: LAD Intervals: RBBB with bi-atrial enlargement and borderline LAFB, ST/T Wave abnormalities: present in leads II, III, AVF Narrative Interpretation: ST with RBBB  Na+/ K+: 148/ 3.7 BUN/Cr.: 80/ 1.41 Serum CO2~25 AG: 5  Hgb: 16.7 Troponin: 201 > 229  CK: 2,952  WBC/ TMAX: 9.9/99.3 Lactic: 2.8 > 3.6  VBG: 7.32/ 53/ 98/ 27.3 CXR 07/05/21: Probable small L pleural effusion & LLL atelectasis vs developing infiltrate. Bilateral hilar prominence. CT head without contrast 07/05/21: no acute intracranial abnormality. Small vessel white matter disease and small foci of bilateral occipital encephalomalacia CT cervical spine 07/05/21: No fracture or static subluxation of the cervical spine Objective   Blood pressure (!) 143/80, pulse (!) 111, temperature 99.14 F (37.3 C), resp. rate (!) 21, height 6' 2.02" (1.88 m), weight 100 kg, SpO2 99 %.    Vent Mode: PSV Set Rate:  [24 bmp] 24 bmp PEEP:  [8 cmH20] 8 cmH20   Intake/Output Summary (Last 24 hours) at 07/07/2021 0917 Last data filed at 07/07/2021 0700 Gross per 24 hour  Intake 2624.24 ml  Output 2500 ml  Net 124.24 ml   Filed Weights   07/05/21 2011 07/05/21 2034 07/07/21 0500  Weight: 109 kg 91.6 kg 100 kg    Examination: General: Chronically ill  appearing elderly male, resting in bed, NAD  HEENT: MM pink/dry> with crusted debris on mouth/face/eyes, anicteric, atraumatic, neck supple Neuro: alert confused to time, follows commands, PERRLA CV: s1s2, ST on monitor,  no M/R/G Pulm: Regular, non labored, breath sounds clear diminished-BUL & diminished-BLL GI: soft, rounded, non tender, +BS x 4 GU: foley in place with dark amber urine, rash consistent with erythrasma at groin/scrotal area Extremities: warm/dry, pulses + 2 radial/1+ distal pulses, trace bilateral upper extremity edema  Skin: Multiple abrasions in different stages of healing: Left forearm, forehead, right shoulder, upper back, bilateral knees and shins & sacral injury examples as below:           Resolved Hospital Problem list   Mechanical Intubation for Airway protection  Acute Encephalopathy   Assessment & Plan:  Suspected Sepsis without septic shock due to suspected aspiration pneumonia Lactic Acidosis in the setting of Rhabdomyolysis-improving  Lactic: 2.8 > 3.6, Baseline PCT: not useful in renal failure, UA: +protein/glucose/rare bacteria (obly 0-5 WBC's), CXR: LLL opacity/ atelectasis vs infiltrate  Initial interventions/workup included: 3.5 L of LR & Cefepime & Vancomycin - Supplemental oxygen as needed, to maintain SpO2 > 90% - Blood cultures 07/25>>negative - Urine cultures 07/25>>negative  - Daily CBC - monitor WBC/ fever curve - IV antibiotics: Unasyn (aspiration PNA) - Aggressive IVF hydration   Acute Kidney Injury secondary to Rhabdomyolysis-improving  Hypernatremia likely secondary to bicarb infusion  Baseline Cr: 0.9, Cr on admission: 5.10, CK- 3,225 - Monitor UOP  - IVF hydration, sodium bicarb infusion - Trend CPK - Daily BMP, replace electrolytes PRN - Avoid nephrotoxic agents as able, ensure adequate renal perfusion - Discontinue sodium bicarb gtt and will start D5W @50  ml/hr   Acute Metabolic Encephalopathy in the setting of Acute Kidney Injury-improving  PMHx: CVA, cerebral arterial thrombosis on warfarin CTH wo contrast negative for intracranial abnormality. More responsive today, no focal deficits noted on exam. Patient's wife recently died, unclear  how the patient has been handling this loss. - Continue warfarin dosing per pharmacy - Continue reorientation  - supportive care - UDS negative  Elevated Troponin now with ST Elevation 07/27 Echo 07/26 EF 65 to 70% with moderate asymmetric left ventricular hypertrophy of the septal segment; mildly increased right ventricular wall thickness RBBB, bi-atrial enlargement & borderline LAFB noted on 12 lead. Favor demand ischemia,  - Will repeat troponin  - Continuous cardiac monitoring  Uncontrolled Type 2 Diabetes Mellitus Hemoglobin A1C: pending - Monitor CBG ac/hs - SSI resistant dosing, will add scheduled novolog; increase dose of schedule lantus; home regimen on hold  - target range while in ICU: 140-180 - follow ICU hyper/hypo-glycemia protocol  Hyperlipidemia Hypertension - home regimen on hold in the setting of rhabdomyolysis & marginal BP - Resume outpatient metoprolol and aspirin   Sacral Pressure injury & scattered abrasions Erythrasma - Present on admission, found down on kitchen floor at home.  - Per brother patient has issues with mobility and uses a walker/cane at home to get around. - WOC consulted, appreciate input - Follow standing skin care orders - Turn and reposition Q 2 - Completed regimen of Clarithromycin for erythrasma - Gearhart's Butt balm  Best Practice (right click and "Reselect all SmartList Selections" daily)  Diet/type: DYS 1 Nectar Thick Liquids DVT prophylaxis: Continue outpatient Warfarin  GI prophylaxis: PPI Lines: N/A Foley: Will discontinue foley  Code Status:  full code Last date of multidisciplinary goals of care discussion 07/07/21  Labs   CBC: Recent Labs  Lab  07/05/21 2033 07/05/21 2241 07/06/21 0421 07/07/21 0149  WBC 13.7* 15.1* 11.8* 9.9  NEUTROABS 11.1*  --   --  8.0*  HGB 17.6* 16.3 16.3 16.7  HCT 52.0 49.3 49.2 48.9  MCV 101.8* 103.4* 103.6* 100.8*  PLT 278 213 194 176    Basic Metabolic Panel: Recent Labs  Lab  07/05/21 2033 07/05/21 2241 07/06/21 0421 07/07/21 0149  NA 140  --  142 148*  K 5.1  --  4.0 3.7  CL 101  --  109 118*  CO2 25  --  24 25  GLUCOSE 472*  --  327* 157*  BUN 133*  --  118* 80*  CREATININE 5.10* 4.38* 3.63* 1.41*  CALCIUM 9.7  --  8.9 8.9  MG  --   --  2.6* 2.4  PHOS  --   --  5.0* 2.7   GFR: Estimated Creatinine Clearance: 46.6 mL/min (A) (by C-G formula based on SCr of 1.41 mg/dL (H)). Recent Labs  Lab 07/05/21 2033 07/05/21 2241 07/06/21 0142 07/06/21 0421 07/07/21 0149  WBC 13.7* 15.1*  --  11.8* 9.9  LATICACIDVEN 2.8* 3.6* 2.3* 2.4*  --     Liver Function Tests: Recent Labs  Lab 07/05/21 2033  AST 78*  ALT 43  ALKPHOS 46  BILITOT 1.8*  PROT 6.6  ALBUMIN 3.0*   No results for input(s): LIPASE, AMYLASE in the last 168 hours. No results for input(s): AMMONIA in the last 168 hours.  ABG    Component Value Date/Time   HCO3 27.3 07/05/2021 2034   ACIDBASEDEF 0.1 07/05/2021 2034   O2SAT 97.0 07/05/2021 2034     Coagulation Profile: Recent Labs  Lab 07/05/21 2033 07/06/21 0421 07/07/21 0149  INR 2.2* 2.4* 3.3*    Cardiac Enzymes: Recent Labs  Lab 07/05/21 2033 07/06/21 0421 07/06/21 1754 07/07/21 0149  CKTOTAL 3,225* 7,798* 4,038* 2,952*    HbA1C: Hemoglobin A1C  Date/Time Value Ref Range Status  10/25/2014 04:15 AM 8.3 (H) 4.2 - 6.3 % Final    Comment:    The American Diabetes Association recommends that a primary goal of therapy should be <7% and that physicians should reevaluate the treatment regimen in patients with HbA1c values consistently >8%.    Hgb A1c MFr Bld  Date/Time Value Ref Range Status  07/06/2021 04:21 AM 8.8 (H) 4.8 - 5.6 % Final    Comment:    (NOTE)         Prediabetes: 5.7 - 6.4         Diabetes: >6.4         Glycemic control for adults with diabetes: <7.0     CBG: Recent Labs  Lab 07/06/21 0949 07/06/21 1142 07/06/21 1531 07/06/21 2011 07/07/21 0755  GLUCAP 156* 151* 173* 163* 169*     Review of Systems: Positives in BOLD   Gen: Denies fever, chills, weight change, fatigue, night sweats HEENT: Denies blurred vision, double vision, hearing loss, tinnitus, sinus congestion, rhinorrhea, sore throat, neck stiffness, dysphagia PULM: Denies shortness of breath, cough, sputum production, hemoptysis, wheezing CV: Denies chest pain, edema, orthopnea, paroxysmal nocturnal dyspnea, palpitations GI: Denies abdominal pain, nausea, vomiting, diarrhea, hematochezia, melena, constipation, change in bowel habits GU: Denies dysuria, hematuria, polyuria, oliguria, urethral discharge Endocrine: Denies hot or cold intolerance, polyuria, polyphagia or appetite change Derm: Denies rash, dry skin, scaling or peeling skin change Heme: Denies easy bruising, bleeding, bleeding gums Neuro: Denies headache, numbness, weakness, slurred speech, loss of memory or consciousness  Allergies Allergies  Allergen Reactions   Darvon [Propoxyphene] Hives   Glipizide Other (See Comments)   Saxagliptin Other (See Comments)    Scheduled Meds:  Chlorhexidine Gluconate Cloth  6 each Topical Daily   clarithromycin  250 mg Oral Q12H   collagenase  1 application Topical Daily   docusate  100 mg Per Tube BID   Gerhardt's butt cream   Topical BID   insulin aspart  0-20 Units Subcutaneous Q4H   insulin glargine-yfgn  8 Units Subcutaneous Daily   pantoprazole (PROTONIX) IV  40 mg Intravenous QHS   polyethylene glycol  17 g Per Tube Daily   Warfarin - Pharmacist Dosing Inpatient   Does not apply q1600   Continuous Infusions:  ampicillin-sulbactam (UNASYN) IV     sodium bicarbonate in 0.45 NS mL infusion 100 mL/hr at 07/07/21 0812   PRN Meds:.albuterol, docusate sodium, fentaNYL (SUBLIMAZE) injection, midazolam, midazolam, polyethylene glycol   Critical care time: 30 minutes     Transition of Care consulted pt will likely need SNF placement for rehab per physical therapy recommendations  Pt stable will  [facility] 07/08/2021  Camie Patienceana Graves, AGNP  Pulmonary/Critical Care Pager [phone] (please enter 7 digits) PCCM Consult Pager [phone] (please enter 7 digits)

## 2021-07-07 NOTE — Progress Notes (Signed)
PHARMACIST - PHYSICIAN COMMUNICATION  CONCERNING: IV to Oral Route Change Policy  RECOMMENDATION: This patient is receiving pantoprazole by the intravenous route.  Based on criteria approved by the Pharmacy and Therapeutics Committee, the intravenous medication(s) is/are being converted to the equivalent oral dose form(s).   DESCRIPTION: These criteria include: The patient is eating (either orally or via tube) and/or has been taking other orally administered medications for a least 24 hours The patient has no evidence of active gastrointestinal bleeding or impaired GI absorption (gastrectomy, short bowel, patient on TNA or NPO).  If you have questions about this conversion, please contact the Pharmacy Department   Tressie Ellis, [facility] 07/07/2021 11:40 AM

## 2021-07-08 DIAGNOSIS — J690 Pneumonitis due to inhalation of food and vomit: Secondary | ICD-10-CM | POA: Diagnosis not present

## 2021-07-08 DIAGNOSIS — J9601 Acute respiratory failure with hypoxia: Secondary | ICD-10-CM | POA: Diagnosis not present

## 2021-07-08 DIAGNOSIS — Z7189 Other specified counseling: Secondary | ICD-10-CM | POA: Diagnosis not present

## 2021-07-08 DIAGNOSIS — E1165 Type 2 diabetes mellitus with hyperglycemia: Secondary | ICD-10-CM | POA: Diagnosis not present

## 2021-07-08 LAB — GLUCOSE, CAPILLARY
Glucose-Capillary: 271 mg/dL — ABNORMAL HIGH (ref 70–99)
Glucose-Capillary: 160 mg/dL — ABNORMAL HIGH (ref 70–99)
Glucose-Capillary: 130 mg/dL — ABNORMAL HIGH (ref 70–99)
Glucose-Capillary: 217 mg/dL — ABNORMAL HIGH (ref 70–99)

## 2021-07-08 LAB — CK
Total CK: 1005 U/L — ABNORMAL HIGH (ref 49–397)
Total CK: 786 U/L — ABNORMAL HIGH (ref 49–397)
Total CK: 656 U/L — ABNORMAL HIGH (ref 49–397)

## 2021-07-08 LAB — BASIC METABOLIC PANEL
Anion gap: 3 — ABNORMAL LOW (ref 5–15)
BUN: 54 mg/dL — ABNORMAL HIGH (ref 8–23)
CO2: 29 mmol/L (ref 22–32)
Calcium: 9.3 mg/dL (ref 8.9–10.3)
Chloride: 118 mmol/L — ABNORMAL HIGH (ref 98–111)
Creatinine, Ser: 1.03 mg/dL (ref 0.61–1.24)
GFR, Estimated: >60 mL/min (ref >60)
Glucose, Bld: 256 mg/dL — ABNORMAL HIGH (ref 70–99)
Potassium: 3.8 mmol/L (ref 3.5–5.1)
Sodium: 150 mmol/L — ABNORMAL HIGH (ref 135–145)

## 2021-07-08 LAB — PROTIME-INR
INR: 3.2 — ABNORMAL HIGH (ref 0.8–1.2)
Prothrombin Time: 32.6 seconds — ABNORMAL HIGH (ref 11.4–15.2)

## 2021-07-08 LAB — MAGNESIUM: Magnesium: 2.4 mg/dL (ref 1.7–2.4)

## 2021-07-08 LAB — PHOSPHORUS: Phosphorus: 2 mg/dL — ABNORMAL LOW (ref 2.5–4.6)

## 2021-07-08 MED ORDER — POTASSIUM & SODIUM PHOSPHATES 280-160-250 MG PO PACK
2.0000 | PACK | Freq: Once | ORAL | Status: AC
  Administered 2021-07-08: 2 via ORAL
  Filled 2021-07-08: qty 2

## 2021-07-08 MED ORDER — DOCUSATE SODIUM 100 MG PO CAPS: 100.0000 mg | ORAL_CAPSULE | Freq: Two times a day (BID) | ORAL | Status: DC

## 2021-07-08 NOTE — Consult Note (Addendum)
PHARMACY CONSULT NOTE  Pharmacy Consult for Electrolyte Monitoring and Replacement   Recent Labs: Potassium (mmol/L)  Date Value  07/08/2021 3.8  10/28/2014 4.0   Magnesium (mg/dL)  Date Value  07/37/1062 2.4  10/26/2014 2.1   Calcium (mg/dL)  Date Value  69/48/5462 9.3   Calcium, Total (mg/dL)  Date Value  70/35/0093 8.6   Albumin (g/dL)  Date Value  81/82/9937 3.0 (L)  10/24/2014 3.7   Phosphorus (mg/dL)  Date Value  16/96/7893 2.0 (L)   Sodium (mmol/L)  Date Value  07/08/2021 150 (H)  10/28/2014 144   Assessment: Patient is an 87 y/o M with medical history including history of CVA, diabetes, HLD, HTN, history of cerebral arterial thrombosis who was admitted after being found down, unresponsive after wellness check. Patient was intubated briefly from 7/25 - 7/26. Pharmacy consulted to assist with electrolyte monitoring and replacement as indicated.   Goal of Therapy:  Electrolytes within normal limits  Plan:  --Na trending up (148 >> 150). Likely multifactorial from diminished oral intake / low grade fevers. Continue D5w at 50 mL/hr. Defer management to primary team --Phos 2, will attempt to replace orally with Phos-Nak 500 mg x 1 --Follow-up electrolytes with AM labs tomorrow  Tressie Ellis 07/08/2021 9:50 AM

## 2021-07-08 NOTE — Progress Notes (Signed)
ANTICOAGULATION CONSULT NOTE  Pharmacy Consult for warfarin Indication: h/o cerebral arterial thrombosis  Patient Measurements: Height: 6' 2.02" (188 cm) Weight: 101 kg (222 lb 10.6 oz) IBW/kg (Calculated) : 82.24  Labs: Recent Labs    07/05/21 2033 07/05/21 2241 07/06/21 0219 07/06/21 0421 07/06/21 1754 07/07/21 0149 07/07/21 0832 07/07/21 0940 07/08/21 0118  HGB 17.6* 16.3  --  16.3  --  16.7  --   --   --   HCT 52.0 49.3  --  49.2  --  48.9  --   --   --   PLT 278 213  --  194  --  176  --   --   --   APTT 28  --   --   --   --   --   --   --   --   LABPROT 24.0*  --   --  25.8*  --  33.6*  --   --  32.6*  INR 2.2*  --   --  2.4*  --  3.3*  --   --  3.2*  CREATININE 5.10* 4.38*  --  3.63*  --  1.41*  --   --  1.03  CKTOTAL 3,225*  --   --  7,798*   < > 2,952* 1,973* 2,024* 1,005*  TROPONINIHS 201* 229* 66*  --   --   --   --  88*  --    < > = values in this interval not displayed.     Estimated Creatinine Clearance: 64.1 mL/min (by C-G formula based on SCr of 1.03 mg/dL).   Medical History: Past Medical History:  Diagnosis Date   Cerebral arterial thrombosis 08/09/2011   Cerebrovascular accident, old 10/27/2015   Diabetes mellitus without complication (HCC)    Metformin   Essential (primary) hypertension 09/25/2014   HLD (hyperlipidemia) 10/27/2015   Hypertension    Nephrolithiasis    Pure hypercholesterolemia 09/25/2014   Type 2 diabetes mellitus (HCC) 09/25/2014    Assessment: 87 year old male on warfarin for h/o cerebral arterial thrombosis. Home dose of warfarin 5 mg daily. Patient presented after having been found down at his home; he was briefly intubated. Pharmacy consult to restart warfarin.  LFTs: Mild transaminitis on admission. Albumin 3. Tbili 1.8 DDI: Unasyn  Date INR Plan  7/25 2.2 Last dose unk.  7/26 2.4 Warfarin 2.5 mg  7/27 3.3 Hold  7/28 3.2 Hold    Goal of Therapy:  INR 2-3   Plan:  --INR 3.2 today. Supratherapeutic. Will  hold warfarin administration tonight --Daily INR per protocol --CBC at least every 3 days per protocol  Tressie Ellis 07/08/2021,8:27 AM

## 2021-07-08 NOTE — Progress Notes (Addendum)
Daily Progress Note   Patient Name: Gabriel [name].       Date: 07/08/2021 DOB: 11-24-1932  Age: 87 y.o. MRN#: [id number] Attending Physician: Charise Killian, MD Primary Care Physician: Dione Housekeeper, MD Admit Date: 07/05/2021  Reason for Consultation/Follow-up: Establishing goals of care  Subjective: Patient is resting in bed. He denies complaint today besides thirst. He has thickened liquids at bedside, and drinks an entire fresh cup without taking his lips from the straw.   We recapped conversations from yesterday. He states he has 3 children, he states his brother does not really know about Gabriel [name] as Gabriel [name] is also a child born outside of his marriage. 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 He tells me about his tattoos he had placed in [place].   He states he was in his barn loft looking through a cedar chest at old letters he had written to his wife over the years. He states he sat there a while reading through them. He states he got up to leave and lost his balance and fell 8 ft out of the loft onto the dirt. He states over time he was able to make it to his kitchen and that is all he remembers. He states he thought he was going to die during all of this.   We discussed his diagnoses, prognosis, GOC, EOL wishes disposition and options.  Created space and opportunity for patient  to explore thoughts and feelings regarding current medical information.   A detailed discussion was had today regarding advanced directives.  Concepts specific to code status, artifical feeding and hydration, IV antibiotics and rehospitalization were discussed.  The difference between an aggressive medical  intervention path and a comfort care path was discussed.  Values and goals of care important to patient and family were attempted to be elicited.  Discussed limitations of medical interventions to prolong quality of life in some situations and discussed the concept of human mortality.  He states he was doing well before the hospitalization. He states he  was fully independent and loved to paint.   He states he would have to consider if he would ever want ventilator support in the future. He states he believes  he would want CPR but needs to consider this as well. He states he would want his brother Gabriel [name] to be his surrogate decision maker/ HPOA if needed.  Length of Stay: 3  Current Medications: Scheduled Meds:  . vitamin C  500 mg Oral Daily  . aspirin EC  81 mg Oral Daily  . Chlorhexidine Gluconate Cloth  6 each Topical Daily  . collagenase  1 application Topical Daily  . docusate sodium  100 mg Oral BID  . feeding supplement (NEPRO CARB STEADY)  237 mL Oral BID BM  . Gerhardt's butt cream   Topical BID  . insulin aspart  0-20 Units Subcutaneous TID WC  . insulin aspart  0-5 Units Subcutaneous QHS  . insulin aspart  2 Units Subcutaneous TID WC  . insulin glargine-yfgn  12 Units Subcutaneous Daily  . metoprolol succinate  25 mg Oral BID  . multivitamin with minerals  1 tablet Oral Daily  . pantoprazole  40 mg Oral QHS  . potassium & sodium phosphates  2 packet Oral Once  . tamsulosin  0.4 mg Oral Daily  . vitamin E  400 Units Oral Daily  . Warfarin - Pharmacist Dosing Inpatient   Does not apply q1600    Continuous Infusions: . ampicillin-sulbactam (UNASYN) IV 3 g (07/08/21 0954)  . dextrose 75 mL/hr at 07/08/21 0951    PRN Meds: albuterol, docusate sodium, polyethylene glycol  Physical Exam Pulmonary:     Effort: Pulmonary effort is normal.  Neurological:     Mental Status: He is alert.            Vital Signs: BP (!) 141/58   Pulse (!) 58   Temp 100.04 F (37.8 C)    Resp (!) 30   Ht 6' 2.02" (1.88 m)   Wt 101 kg   SpO2 97%   BMI 28.58 kg/m  SpO2: SpO2: 97 % O2 Device: O2 Device: Nasal Cannula O2 Flow Rate: O2 Flow Rate (L/min): 2 L/min  Intake/output summary:  Intake/Output Summary (Last 24 hours) at 07/08/2021 1035 Last data filed at 07/08/2021 0900 Gross per 24 hour  Intake 1703.14 ml  Output 2550 ml  Net -846.86 ml   LBM: Last BM Date:  (PTA) Baseline Weight: Weight: 109 kg Most recent weight: Weight: 101 kg         Patient Active Problem List   Diagnosis Date Noted  . On mechanically assisted ventilation (HCC) 07/06/2021  . Uncontrolled type 2 diabetes mellitus (HCC) 07/06/2021  . Acute metabolic encephalopathy 07/06/2021  . Acute kidney injury (HCC) 07/06/2021  . Pressure injury of skin of sacral region 07/06/2021  . Altered mental status   . Elevated CK   . Acute respiratory failure (HCC) 07/05/2021  . HLD (hyperlipidemia) 10/27/2015  . Cerebrovascular accident, old 10/27/2015  . Essential (primary) hypertension 09/25/2014  . Pure hypercholesterolemia 09/25/2014  . Type 2 diabetes mellitus (HCC) 09/25/2014  . Cerebral arterial thrombosis 08/09/2011  . Long term current use of anticoagulant 08/09/2011    Palliative Care Assessment & Plan    Recommendations/Plan: Full code/full scope.   Code Status:    Code Status Orders  (From admission, onward)           Start     Ordered   07/05/21 2156  Full code  Continuous        07/05/21 2157           Code Status History     This patient has a current code  status but no historical code status.      Thank you for allowing the Palliative Medicine Team to assist in the care of this patient.       Total Time 35 min Prolonged Time Billed  no       Greater than 50%  of this time was spent counseling and coordinating care related to the above assessment and plan.  Morton Stall, NP  Please contact Palliative Medicine Team phone at [phone] for questions  and concerns.

## 2021-07-08 NOTE — Progress Notes (Addendum)
PROGRESS NOTE    Gabriel [name].  FXJ:[id number] DOB: 07/19/1932 DOA: 07/05/2021 PCP: Valera Castle, MD    Assessment & Plan:   Active Problems:   Acute respiratory failure (HCC)   Elevated CK   On mechanically assisted ventilation (HCC)   Uncontrolled type 2 diabetes mellitus ([place])   Acute metabolic encephalopathy   Acute kidney injury ([place])   Pressure injury of skin of sacral region   Sepsis: met criteria w/ leukocytosis, fever, tachycardia, elevated lactic acid & aspiration pneumonia. Blood cxs NGTD. Urine cx no growth. Continue on IV unasyn  Aspiration pneumonia: continue on IV unasyn, bronchodilators & encourage incentive spirometry   Hypernatremia: free water deficit is 3.6L. Continue on D5W at 75  Hypophosphatemia: phosp repleated. Will continue to monitor    AKI: secondary to rhabdomyolysis. Cr is trending down from day prior. Continue on IVFs  Rhabdomyolysis: CK is trending down. Continue on IVFs   Acute metabolic encephalopathy: likely secondary to all above. Hx of CVA, cerebral arterial thrombosis, on warfarin. INR 3.2 today. Pharmacy to monitor & dose warfarin   Elevated troponin: now with ST Elevation 07/27. Echo 07/26 EF 65 to 70% with moderate asymmetric left ventricular hypertrophy of the septal segment; mildly increased right ventricular wall thickness, RBBB, bi-atrial enlargement & borderline LAFB noted on 12 lead. Favor demand ischemia as per ICU    DM2: uncontrolled. HbA1c is pending. Continue glargine, SSI w/ accuchecks  HLD: holding statin w/ rhabdo  HTN: continue on home dose of metoprolol   Sacral Pressure injury & scattered abrasions: present on admission. Unknown stage. Wound care consulted    DVT prophylaxis: warfarin  Code Status: full  Family Communication: ( Disposition Plan: depends on PT/OT recs.  Level of care: Stepdown  Status is: Inpatient  Remains inpatient appropriate because:Ongoing diagnostic testing needed not  appropriate for outpatient work up, Unsafe d/c plan, IV treatments appropriate due to intensity of illness or inability to take PO, and Inpatient level of care appropriate due to severity of illness  Dispo: The patient is from: Home              Anticipated d/c is to: SNF vs [facility]               Patient currently is not medically stable to d/c.   Difficult to place patient : unclear   Consultants:  Palliative care ICU  Procedures:   Antimicrobials:   Subjective: Pt c/o shortness of breath   Objective: Vitals:   07/08/21 0500 07/08/21 0600 07/08/21 0700 07/08/21 0800  BP: 127/63 131/65 (!) 143/69 (!) 141/58  Pulse: (!) 112 (!) 103 (!) 107 (!) 58  Resp: (!) 32 (!) 26 (!) 28 (!) 30  Temp: (!) 100.4 F (38 C) (!) 100.4 F (38 C) 100.04 F (37.8 C) 100.04 F (37.8 C)  TempSrc:      SpO2: 100% 100% 97% 97%  Weight: 101 kg     Height:        Intake/Output Summary (Last 24 hours) at 07/08/2021 0858 Last data filed at 07/08/2021 0800 Gross per 24 hour  Intake 1643.14 ml  Output 2550 ml  Net -906.86 ml   Filed Weights   07/05/21 2034 07/07/21 0500 07/08/21 0500  Weight: 91.6 kg 100 kg 101 kg    Examination:  General exam: Appears calm and comfortable  Respiratory system: diminished breath sounds b/l Cardiovascular system: S1 & S2 +. No rubs, gallops or clicks. B/l LE edema  Gastrointestinal system:  Abdomen is nondistended, soft and nontender. Hypoactive bowel sounds heard. Central nervous system: Alert and awake. Moves all extremities  Psychiatry: Judgement and insight appear abnormal. Flat mood and affect    Data Reviewed: I have personally reviewed following labs and imaging studies  CBC: Recent Labs  Lab 07/05/21 2033 07/05/21 2241 07/06/21 0421 07/07/21 0149  WBC 13.7* 15.1* 11.8* 9.9  NEUTROABS 11.1*  --   --  8.0*  HGB 17.6* 16.3 16.3 16.7  HCT 52.0 49.3 49.2 48.9  MCV 101.8* 103.4* 103.6* 100.8*  PLT 278 213 194 637   Basic Metabolic Panel: Recent  Labs  Lab 07/05/21 2033 07/05/21 2241 07/06/21 0421 07/07/21 0149 07/08/21 0118  NA 140  --  142 148* 150*  K 5.1  --  4.0 3.7 3.8  CL 101  --  109 118* 118*  CO2 25  --  _0 GLUCOSE 472*  --  327* 157* 256*  BUN 133*  --  118* 80* 54*  CREATININE 5.10* 4.38* 3.63* 1.41* 1.03  CALCIUM 9.7  --  8.9 8.9 9.3  MG  --   --  2.6* 2.4 2.4  PHOS  --   --  5.0* 2.7 2.0*   GFR: Estimated Creatinine Clearance: 64.1 mL/min (by C-G formula based on SCr of 1.03 mg/dL). Liver Function Tests: Recent Labs  Lab 07/05/21 2033  AST 78*  ALT 43  ALKPHOS 46  BILITOT 1.8*  PROT 6.6  ALBUMIN 3.0*   No results for input(s): LIPASE, AMYLASE in the last 168 hours. No results for input(s): AMMONIA in the last 168 hours. Coagulation Profile: Recent Labs  Lab 07/05/21 2033 07/06/21 0421 07/07/21 0149 07/08/21 0118  INR 2.2* 2.4* 3.3* 3.2*   Cardiac Enzymes: Recent Labs  Lab 07/06/21 1754 07/07/21 0149 07/07/21 0832 07/07/21 0940 07/08/21 0118  CKTOTAL 4,038* 2,952* 1,973* 2,024* 1,005*   BNP (last 3 results) No results for input(s): PROBNP in the last 8760 hours. HbA1C: Recent Labs    07/06/21 0421  HGBA1C 8.8*   CBG: Recent Labs  Lab 07/07/21 0755 07/07/21 1147 07/07/21 1521 07/07/21 2121 07/08/21 0757  GLUCAP 169* 199* 273* 198* 271*   Lipid Profile: No results for input(s): CHOL, HDL, LDLCALC, TRIG, CHOLHDL, LDLDIRECT in the last 72 hours. Thyroid Function Tests: Recent Labs    07/06/21 0421  TSH 0.759   Anemia Panel: No results for input(s): VITAMINB12, FOLATE, FERRITIN, TIBC, IRON, RETICCTPCT in the last 72 hours. Sepsis Labs: Recent Labs  Lab 07/05/21 2033 07/05/21 2241 07/06/21 0142 07/06/21 0421  LATICACIDVEN 2.8* 3.6* 2.3* 2.4*    Recent Results (from the past 240 hour(s))  Resp Panel by RT-PCR (Flu A&B, Covid) Nasopharyngeal Swab     Status: None   Collection Time: 07/05/21  8:22 PM   Specimen: Nasopharyngeal Swab; Nasopharyngeal(NP)  swabs in vial transport medium  Result Value Ref Range Status   SARS Coronavirus 2 by RT PCR NEGATIVE NEGATIVE Final    Comment: (NOTE) SARS-CoV-2 target nucleic acids are NOT DETECTED.  The SARS-CoV-2 RNA is generally detectable in upper respiratory specimens during the acute phase of infection. The lowest concentration of SARS-CoV-2 viral copies this assay can detect is 138 copies/mL. A negative result does not preclude SARS-Cov-2 infection and should not be used as the sole basis for treatment or other patient management decisions. A negative result may occur with  improper specimen collection/handling, submission of specimen other than nasopharyngeal swab, presence of viral mutation(s) within the areas targeted by  this assay, and inadequate number of viral copies(<138 copies/mL). A negative result must be combined with clinical observations, patient history, and epidemiological information. The expected result is Negative.  Fact Sheet for Patients:  EntrepreneurPulse.com.au  Fact Sheet for Healthcare Providers:  IncredibleEmployment.be  This test is no t yet approved or cleared by the [place] FDA and  has been authorized for detection and/or diagnosis of SARS-CoV-2 by FDA under an Emergency Use Authorization (EUA). This EUA will remain  in effect (meaning this test can be used) for the duration of the COVID-19 declaration under Section 564(b)(1) of the Act, 21 U.S.C.section 360bbb-3(b)(1), unless the authorization is terminated  or revoked sooner.       Influenza A by PCR NEGATIVE NEGATIVE Final   Influenza B by PCR NEGATIVE NEGATIVE Final    Comment: (NOTE) The Xpert Xpress SARS-CoV-2/FLU/RSV plus assay is intended as an aid in the diagnosis of influenza from Nasopharyngeal swab specimens and should not be used as a sole basis for treatment. Nasal washings and aspirates are unacceptable for Xpert Xpress  SARS-CoV-2/FLU/RSV testing.  Fact Sheet for Patients: EntrepreneurPulse.com.au  Fact Sheet for Healthcare Providers: IncredibleEmployment.be  This test is not yet approved or cleared by the [place] FDA and has been authorized for detection and/or diagnosis of SARS-CoV-2 by FDA under an Emergency Use Authorization (EUA). This EUA will remain in effect (meaning this test can be used) for the duration of the COVID-19 declaration under Section 564(b)(1) of the Act, 21 U.S.C. section 360bbb-3(b)(1), unless the authorization is terminated or revoked.  Performed at [facility], [address] [place], [place] [address]   Blood Culture (routine x 2)     Status: None (Preliminary result)   Collection Time: 07/05/21  8:33 PM   Specimen: BLOOD  Result Value Ref Range Status   Specimen Description BLOOD RIGHT ANTECUBITAL  Final   Special Requests   Final    BOTTLES DRAWN AEROBIC AND ANAEROBIC Blood Culture results may not be optimal due to an inadequate volume of blood received in culture bottles   Culture   Final    NO GROWTH 3 DAYS Performed at [facility], [address] [place], [place] [address]    Report Status PENDING  Incomplete  Urine Culture     Status: None   Collection Time: 07/05/21  8:33 PM   Specimen: Urine, Random  Result Value Ref Range Status   Specimen Description   Final    URINE, RANDOM Performed at [facility], [address] [place], [place] [address]    Special Requests   Final    NONE Performed at [facility], [address], [place], [place] [address]    Culture   Final    NO GROWTH Performed at [facility], [place] [address], [place], [place] [address]    Report Status 07/07/2021 FINAL  Final  Blood Culture (routine x 2)     Status: None (Preliminary result)   Collection Time: 07/05/21  8:38 PM   Specimen: BLOOD  Result Value Ref Range Status   Specimen  Description BLOOD BLOOD RIGHT FOREARM  Final   Special Requests   Final    BOTTLES DRAWN AEROBIC AND ANAEROBIC Blood Culture adequate volume   Culture   Final    NO GROWTH 3 DAYS Performed at [facility], [address] [place], [place] [address]    Report Status PENDING  Incomplete  MRSA Next Gen by PCR, Nasal  Status: None   Collection Time: 07/06/21 12:34 AM   Specimen: Nasal Mucosa; Nasal Swab  Result Value Ref Range Status   MRSA by PCR Next Gen NOT DETECTED NOT DETECTED Final    Comment: (NOTE) The GeneXpert MRSA Assay (FDA approved for NASAL specimens only), is one component of a comprehensive MRSA colonization surveillance program. It is not intended to diagnose MRSA infection nor to guide or monitor treatment for MRSA infections. Test performance is not FDA approved in patients less than 23 years old. Performed at [facility], [address] [place], [place] [address]   MRSA Next Gen by PCR, Nasal     Status: None   Collection Time: 07/06/21 10:00 AM   Specimen: Nasal Mucosa; Nasal Swab  Result Value Ref Range Status   MRSA by PCR Next Gen NOT DETECTED NOT DETECTED Final    Comment: (NOTE) The GeneXpert MRSA Assay (FDA approved for NASAL specimens only), is one component of a comprehensive MRSA colonization surveillance program. It is not intended to diagnose MRSA infection nor to guide or monitor treatment for MRSA infections. Test performance is not FDA approved in patients less than 65 years old. Performed at [facility], [address] [place], [place] [address]          Radiology Studies: ECHOCARDIOGRAM COMPLETE  Result Date: 07/06/2021    ECHOCARDIOGRAM REPORT   Patient Name:   Gabriel [name]. Date of Exam: 07/06/2021 Medical Rec #:  [id number]          Height:       74.0 in Accession #:    [id number]         Weight:       201.9 lb Date of Birth:  [birth year]          BSA:          2.182 m Patient Age:    87 years            BP:           127/54 mmHg Patient Gender: M                  HR:           99 bpm. Exam Location:  [facility] Procedure: 2D Echo, Cardiac Doppler and Color Doppler Indications:     Cardiomyopathy--unspecified I42.9  History:         Patient has no prior history of Echocardiogram examinations.                  CHF; Risk Factors:Hypertension and Diabetes.  Sonographer:     Sherrie Sport RDCS (AE) Referring Phys:  [id number] BRITTON L RUST[place]CHESTER Diagnosing Phys: Yolonda Kida MD  Sonographer Comments: Technically difficult study due to poor echo windows, echo performed with patient supine and on artificial respirator, no apical window and suboptimal parasternal window. IMPRESSIONS  1. Left ventricular ejection fraction, by estimation, is 65 to 70%. The left ventricle has normal function. The left ventricle has no regional wall motion abnormalities. There is moderate asymmetric left ventricular hypertrophy of the septal segment. Left ventricular diastolic function could not be evaluated.  2. Right ventricular systolic function is low normal. The right ventricular size is mildly enlarged. Mildly increased right ventricular wall thickness.  3. The mitral valve is normal in structure. No evidence of mitral valve regurgitation.  4. The aortic valve is normal in structure. Aortic valve regurgitation is not visualized. No aortic stenosis is present. Conclusion(s)/Recommendation(s):  Poor windows for evaluation of left ventricular function by transthoracic echocardiography. Would recommend an alternative means of evaluation. FINDINGS  Left Ventricle: Left ventricular ejection fraction, by estimation, is 65 to 70%. The left ventricle has normal function. The left ventricle has no regional wall motion abnormalities. The left ventricular internal cavity size was normal in size. There is  moderate asymmetric left ventricular hypertrophy of the septal segment. Left ventricular diastolic function could not be evaluated. Right  Ventricle: The right ventricular size is mildly enlarged. Mildly increased right ventricular wall thickness. Right ventricular systolic function is low normal. Left Atrium: Left atrial size was normal in size. Right Atrium: Right atrial size was normal in size. Pericardium: There is no evidence of pericardial effusion. Mitral Valve: The mitral valve is normal in structure. No evidence of mitral valve regurgitation. Tricuspid Valve: The tricuspid valve is normal in structure. Tricuspid valve regurgitation is trivial. Aortic Valve: The aortic valve is normal in structure. Aortic valve regurgitation is not visualized. No aortic stenosis is present. Pulmonic Valve: The pulmonic valve was grossly normal. Pulmonic valve regurgitation is not visualized. Aorta: The ascending aorta was not well visualized. IAS/Shunts: No atrial level shunt detected by color flow Doppler.  LEFT VENTRICLE PLAX 2D LVIDd:         3.54 cm LVIDs:         2.25 cm LV PW:         1.17 cm LV IVS:        1.72 cm  LEFT ATRIUM         Index LA diam:    3.10 cm 1.42 cm/m                        PULMONIC VALVE AORTA                 PV Vmax:        1.01 m/s Ao Root diam: 3.10 cm PV Peak grad:   4.1 mmHg                       RVOT Peak grad: 8 mmHg  TRICUSPID VALVE TR Peak grad:   14.3 mmHg TR Vmax:        189.00 cm/s Dwayne D Callwood MD Electronically signed by Yolonda Kida MD Signature Date/Time: 07/06/2021/1:36:28 PM    Final         Scheduled Meds:  vitamin C  500 mg Oral Daily   aspirin EC  81 mg Oral Daily   Chlorhexidine Gluconate Cloth  6 each Topical Daily   collagenase  1 application Topical Daily   docusate sodium  100 mg Oral BID   feeding supplement (NEPRO CARB STEADY)  237 mL Oral BID BM   Gerhardt's butt cream   Topical BID   insulin aspart  0-20 Units Subcutaneous TID WC   insulin aspart  0-5 Units Subcutaneous QHS   insulin aspart  2 Units Subcutaneous TID WC   insulin glargine-yfgn  12 Units Subcutaneous Daily    metoprolol succinate  25 mg Oral BID   multivitamin with minerals  1 tablet Oral Daily   pantoprazole  40 mg Oral QHS   tamsulosin  0.4 mg Oral Daily   vitamin E  400 Units Oral Daily   Warfarin - Pharmacist Dosing Inpatient   Does not apply q1600   Continuous Infusions:  ampicillin-sulbactam (UNASYN) IV Stopped (07/08/21 0559)   dextrose 50 mL/hr at 07/08/21  0600     LOS: 3 days    Time spent:33 mins     Wyvonnia Dusky, MD [facility] Pager [phone]  If 7PM-7AM, please contact night-coverage 07/08/2021, 8:58 AM

## 2021-07-08 NOTE — Progress Notes (Signed)
Handoff report given to [facility] via phone , plan to transfer pt to new unit.

## 2021-07-09 DIAGNOSIS — E870 Hyperosmolality and hypernatremia: Secondary | ICD-10-CM

## 2021-07-09 DIAGNOSIS — J690 Pneumonitis due to inhalation of food and vomit: Secondary | ICD-10-CM | POA: Diagnosis not present

## 2021-07-09 LAB — BASIC METABOLIC PANEL
Anion gap: 4 — ABNORMAL LOW (ref 5–15)
BUN: 42 mg/dL — ABNORMAL HIGH (ref 8–23)
CO2: 31 mmol/L (ref 22–32)
Calcium: 9.4 mg/dL (ref 8.9–10.3)
Chloride: 117 mmol/L — ABNORMAL HIGH (ref 98–111)
Creatinine, Ser: 0.97 mg/dL (ref 0.61–1.24)
GFR, Estimated: >60 mL/min (ref >60)
Glucose, Bld: 248 mg/dL — ABNORMAL HIGH (ref 70–99)
Potassium: 3.6 mmol/L (ref 3.5–5.1)
Sodium: 152 mmol/L — ABNORMAL HIGH (ref 135–145)

## 2021-07-09 LAB — PROTIME-INR
INR: 2.9 — ABNORMAL HIGH (ref 0.8–1.2)
Prothrombin Time: 30.4 seconds — ABNORMAL HIGH (ref 11.4–15.2)

## 2021-07-09 LAB — GLUCOSE, CAPILLARY
Glucose-Capillary: 246 mg/dL — ABNORMAL HIGH (ref 70–99)
Glucose-Capillary: 317 mg/dL — ABNORMAL HIGH (ref 70–99)
Glucose-Capillary: 222 mg/dL — ABNORMAL HIGH (ref 70–99)
Glucose-Capillary: 270 mg/dL — ABNORMAL HIGH (ref 70–99)

## 2021-07-09 LAB — MAGNESIUM: Magnesium: 2.4 mg/dL (ref 1.7–2.4)

## 2021-07-09 LAB — CBC
HCT: 45.8 % (ref 39.0–52.0)
Hemoglobin: 15.2 g/dL (ref 13.0–17.0)
MCH: 34.5 pg — ABNORMAL HIGH (ref 26.0–34.0)
MCHC: 33.2 g/dL (ref 30.0–36.0)
MCV: 104.1 fL — ABNORMAL HIGH (ref 80.0–100.0)
Platelets: 175 10*3/uL (ref 150–400)
RBC: 4.4 MIL/uL (ref 4.22–5.81)
RDW: 13.4 % (ref 11.5–15.5)
WBC: 10.4 10*3/uL (ref 4.0–10.5)
nRBC: 0 % (ref 0.0–0.2)

## 2021-07-09 LAB — SODIUM: Sodium: 152 mmol/L — ABNORMAL HIGH (ref 135–145)

## 2021-07-09 LAB — CK
Total CK: 550 U/L — ABNORMAL HIGH (ref 49–397)
Total CK: 455 U/L — ABNORMAL HIGH (ref 49–397)

## 2021-07-09 LAB — PHOSPHORUS: Phosphorus: 2.4 mg/dL — ABNORMAL LOW (ref 2.5–4.6)

## 2021-07-09 MED ORDER — WARFARIN SODIUM 1 MG PO TABS
1.0000 mg | ORAL_TABLET | Freq: Once | ORAL | Status: AC
  Administered 2021-07-09: 1 mg via ORAL
  Filled 2021-07-09: qty 1

## 2021-07-09 MED ORDER — K PHOS MONO-SOD PHOS DI & MONO 155-852-130 MG PO TABS
500.0000 mg | ORAL_TABLET | Freq: Once | ORAL | Status: AC
  Administered 2021-07-09: 500 mg via ORAL
  Filled 2021-07-09: qty 2

## 2021-07-09 NOTE — Progress Notes (Signed)
   07/09/21 1415  Clinical Encounter Type  Visited With Patient and family together  Visit Type  (advanced directive)  Referral From Family  Spiritual Encounters  Spiritual Needs Other (Comment)  Chaplain Burris received call from Pt's brother at approximately 1:30pm inquiring about "power of attorney." Chaplain informed family that we help facilitate advanced directives and MPOA only. Chaplain offered to go speak with Pt about this but brother stated "I will meet you there at 2:30pm". Chaplain stated that we could all discuss together but that the decision must be made solely by the Pt and that completing the document did not require family presence. Chaplain Burris met with Pt and brother and communicated twice to brother that it felt like there was undue pressure or urgency. Chaplain shared with the Pt that she could return and have further discussion with Pt alone. Chaplain then discussed with Doctor, hospital and charge nurse. Pt's brother had someone at Radiology desk page chaplain twice with message "return because we are ready to sign the paperwork." This was fifteen minutes after leaving Pt who had stated he was tired. Will follow up with Pt as appropriate or needed.

## 2021-07-09 NOTE — Progress Notes (Signed)
Inpatient Diabetes Program Recommendations  AACE/ADA: New Consensus Statement on Inpatient Glycemic Control (2015)  Target Ranges:  Prepandial:   less than 140 mg/dL      Peak postprandial:   less than 180 mg/dL (1-2 hours)      Critically ill patients:  140 - 180 mg/dL   Lab Results  Component Value Date   GLUCAP 246 (H) 07/09/2021   HGBA1C 8.8 (H) 07/06/2021    Review of Glycemic Control Results for Gabriel [name], Gabriel [name] (MRN [id number]) as of 07/09/2021 09:16  Ref. Range 07/08/2021 07:57 07/08/2021 12:00 07/08/2021 15:38 07/08/2021 21:14 07/09/2021 08:38  Glucose-Capillary Latest Ref Range: 70 - 99 mg/dL 124 (H) 580 (H) 998 (H) 217 (H) 246 (H)   Diabetes history: DM 2 Outpatient Diabetes medications: Jardiance 10 QD, Glipizide XL 10 QD, Metformin 1000 BID Current orders for Inpatient glycemic control:  Semglee 12 units Daily Novolog 0-20 units tid + hs Novolog 2 units tid meal coverage  A1c 8.8% on 7/26  Inpatient Diabetes Program Recommendations:    -   Increase Semglee to 16 units -   Lower Novolog correction scale to 0-9 units tid  -   Increase Novolog meal coverage to 4 units tid if eating >50% of meals  Thanks,  Christena Deem RN, MSN, BC-ADM Inpatient Diabetes Coordinator Team Pager [phone] (8a-5p)

## 2021-07-09 NOTE — Progress Notes (Signed)
Occupational Therapy Treatment Patient Details Name: Gabriel [name]. MRN: [id number] DOB: 03-11-1932 Today's Date: 07/09/2021    History of present illness 87 yo male presenting to [facility] ED via EMS after being found on the ground of his home unresponsive by police during a wellness check.  Per ED documentation and police report they were called to the patient's house having been called for a welfare check-in, apparently no one had seen him in ~4 days and he was covered in feces and insects when they found him.  Pt intubated for 1 day.   OT comments  Pt seen for OT tx this date to f/u re: safety with ADLs/ADL mobility. Pt stating he is going to go home and that he is in [place], generally seems he is less oriented this date. Comes to EOB sitting with TOTAL A. Pt demos P static sitting balance and requires MOD A throughout to support. OT engages pt in below-listed exercises to improve strength in cervical region. Pt with limited tolerance to sit of only ~5 mins and returned to supine with TOTAL A. RN notified that pt's IV might need ot be re-secured. Will continue to follow.   Follow Up Recommendations  SNF    Equipment Recommendations  Other (comment) (defer)    Recommendations for Other Services      Precautions / Restrictions Precautions Precautions: Fall Restrictions Weight Bearing Restrictions: No       Mobility Bed Mobility Overal bed mobility: Needs Assistance Bed Mobility: Supine to Sit;Sit to Supine Rolling: Max assist;+2 for physical assistance   Supine to sit: Total assist Sit to supine: Total assist   General bed mobility comments: OT cues to pt to contribute to transition to sitting including hand over hand to help him find railing and utilize bed rails, but pt ultimately still requires TOTAL A and very minimally contributes to mobilizing. States more than once, "I can't" despite OT educating that he will not rebuild strength without trying.    Transfers                  General transfer comment: deferred, not safe    Balance Overall balance assessment: Needs assistance Sitting-balance support: Bilateral upper extremity supported Sitting balance-Leahy Scale: Poor Sitting balance - Comments: requires MOD A to sustain static sitting balance.                                   ADL either performed or assessed with clinical judgement   ADL                                               Vision Patient Visual Report: No change from baseline     Perception     Praxis      Cognition Arousal/Alertness: Awake/alert Behavior During Therapy: WFL for tasks assessed/performed Overall Cognitive Status: No family/caregiver present to determine baseline cognitive functioning                                 General Comments: Pt appears somewhat less oriented today. He does follow most simple one step commands, but states that he is in [place] and states that he is going home  Exercises General Exercises - Lower Extremity Short Arc Quad: AAROM;Strengthening;Both;10 reps;Supine Heel Slides: AAROM;Strengthening;Both;10 reps;Supine Hip ABduction/ADduction: AROM;Strengthening;Both;10 reps;Supine (hip adduction pillow squeezes) Straight Leg Raises: AAROM;Strengthening;Both;10 reps;Supine Other Exercises Other Exercises: OT engages pt in 2 sets x10 reps cervical flexion/extension while seated EOB with OT supporting for balance; to improve postural control, improve surface area for breathing, and generally strengthen cervical area as pt is noted to be struggling to hold his head up in sitting.   Shoulder Instructions       General Comments      Pertinent Vitals/ Pain       Pain Assessment: Faces Faces Pain Scale: Hurts a little bit Pain Location: generally points to head/neck area, difficult for pt to quantify or qualify pain d/t cognition Pain Descriptors / Indicators: Sore;Aching Pain  Intervention(s): Limited activity within patient's tolerance;Monitored during session;Repositioned  Home Living                                          Prior Functioning/Environment              Frequency  Min 2X/week        Progress Toward Goals  OT Goals(current goals can now be found in the care plan section)  Progress towards OT goals: Not progressing toward goals - comment;OT to reassess next treatment (pt seemingly slightly less oriented and generally slightly less participatory this date. Pt with P static sitting balance requiring assist throughout versus F balance yesterday on PT evaluation. Will re-assess next session whether goals should be adj.)  Acute Rehab OT Goals Patient Stated Goal: to go home OT Goal Formulation: With patient Time For Goal Achievement: 07/21/21 Potential to Achieve Goals: Fair  Plan Discharge plan remains appropriate    Co-evaluation                 AM-PAC OT "6 Clicks" Daily Activity     Outcome Measure   Help from another person eating meals?: A Lot Help from another person taking care of personal grooming?: A Lot Help from another person toileting, which includes using toliet, bedpan, or urinal?: Total Help from another person bathing (including washing, rinsing, drying)?: Total Help from another person to put on and taking off regular upper body clothing?: A Lot Help from another person to put on and taking off regular lower body clothing?: Total 6 Click Score: 9    End of Session    OT Visit Diagnosis: Other abnormalities of gait and mobility (R26.89);Muscle weakness (generalized) (M62.81);History of falling (Z91.81)   Activity Tolerance Patient limited by fatigue;Treatment limited secondary to medical complications (Comment)   Patient Left in bed;with call bell/phone within reach;with bed alarm set   Nurse Communication Mobility status;Other (comment) (notified nurse that pt's R AC IV appears to be  coming out of his arm. OT paused fluids to avoid infiltration.)        Time: 3354-5625 OT Time Calculation (min): 12 min  Charges: OT General Charges $OT Visit: 1 Visit OT Treatments $Therapeutic Exercise: 8-22 mins  Rejeana Brock, MS, OTR/L ascom [phone] 07/09/21, 4:34 PM

## 2021-07-09 NOTE — Progress Notes (Signed)
   07/09/21 1925  Clinical Encounter Type  Visited With Patient  Visit Type Follow-up;Spiritual support;Social support  Referral From Other (Comment) (rounding)  Spiritual Encounters  Spiritual Needs Emotional  Chaplain Deloria Lair made a follow up visit to speak with Pt. Initially met Pt with brother present and felt that it would help to speak with Pt alone. Mr. Cruey has lost his spouse in past few years and he did mention difficulty of this loss. Otherwise communication was unfocused and seemed confused. Chaplain Burris provided compassionate presence and also spoke with his nurse about needs.

## 2021-07-09 NOTE — Progress Notes (Signed)
PROGRESS NOTE    Gabriel [name].  PTW:[id number] DOB: 03-09-1932 DOA: 07/05/2021 PCP: Valera Castle, MD    Assessment & Plan:   Active Problems:   Acute respiratory failure (HCC)   Elevated CK   On mechanically assisted ventilation (HCC)   Uncontrolled type 2 diabetes mellitus ([place])   Acute metabolic encephalopathy   Acute kidney injury ([place])   Pressure injury of skin of sacral region   Sepsis: met criteria w/ leukocytosis, fever, tachycardia, elevated lactic acid & aspiration pneumonia. Blood cxs NGTD. Urine cx no growth. Continue on IV unasyn. Resolved   Aspiration pneumonia: continue on IV unasyn x 7 days, bronchodilators & encourage incentive spirometry   Hypernatremia: free water deficit is 4.3L. Increased D5W to 100 as Na 152 today  Hypophosphatemia: phos repleated. Will continue to monitor    AKI: resolved   Rhabdomyolysis: CK continues to trend down daily. Continue on IVFs   Acute metabolic encephalopathy: likely secondary to all above. Hx of CVA, cerebral arterial thrombosis, on warfarin. Pharmacy to monitor & dose warfarin   Elevated troponin: now with ST Elevation 07/27. Echo 07/26 EF 65 to 70% with moderate asymmetric left ventricular hypertrophy of the septal segment; mildly increased right ventricular wall thickness, RBBB, bi-atrial enlargement & borderline LAFB noted on 12 lead. Favor demand ischemia as per ICU    DM2: uncontrolled, HbA1c 8.8. Continue on glargine, SSI w/ accuchecks  HLD: holding statin w/ rhabdomyolysis   HTN: continue on metoprolol   Sacral Pressure injury & scattered abrasions: present on admission. Unknown stage. Wound care consulted    DVT prophylaxis: warfarin  Code Status: full  Family Communication: discussed pt's care w/ pt's family at bedside and answered their questions  Disposition Plan: therapy recs SNF  Level of care: Progressive Cardiac  Status is: Inpatient  Remains inpatient appropriate because:Ongoing  diagnostic testing needed not appropriate for outpatient work up, Unsafe d/c plan, IV treatments appropriate due to intensity of illness or inability to take PO, and Inpatient level of care appropriate due to severity of illness  Dispo: The patient is from: Home              Anticipated d/c is to: SNF vs [facility]               Patient currently is not medically stable to d/c.   Difficult to place patient : unclear   Consultants:  Palliative care ICU  Procedures:   Antimicrobials:   Subjective: Pt c/o weakness  Objective: Vitals:   07/08/21 2056 07/08/21 2208 07/09/21 0123 07/09/21 0514  BP: 121/77  127/68 (!) 134/55  Pulse: (!) 54 (!) 53 (!) 51 (!) 56  Resp: (!) 22  20 19   Temp: 98.9 F (37.2 C)  98.5 F (36.9 C) 98.4 F (36.9 C)  TempSrc: Oral     SpO2: 94%  92% 99%  Weight:      Height:        Intake/Output Summary (Last 24 hours) at 07/09/2021 0805 Last data filed at 07/09/2021 0559 Gross per 24 hour  Intake 2629.52 ml  Output 1401 ml  Net 1228.52 ml   Filed Weights   07/05/21 2034 07/07/21 0500 07/08/21 0500  Weight: 91.6 kg 100 kg 101 kg    Examination:  General exam: Appears comfortable   Respiratory system: decreased breath sounds b/l Cardiovascular system: S1/S2+. No rubs or clicks  Gastrointestinal system: Abd is soft, NT, ND & hypoactive bowel sounds Central nervous system: Alert and awake.  Moves all extremities  Psychiatry: Judgement and insight appear abnormal. Flat mood and affect    Data Reviewed: I have personally reviewed following labs and imaging studies  CBC: Recent Labs  Lab 07/05/21 2033 07/05/21 2241 07/06/21 0421 07/07/21 0149 07/09/21 0308  WBC 13.7* 15.1* 11.8* 9.9 10.4  NEUTROABS 11.1*  --   --  8.0*  --   HGB 17.6* 16.3 16.3 16.7 15.2  HCT 52.0 49.3 49.2 48.9 45.8  MCV 101.8* 103.4* 103.6* 100.8* 104.1*  PLT 278 213 194 176 161   Basic Metabolic Panel: Recent Labs  Lab 07/05/21 2033 07/05/21 2241 07/06/21 0421  07/07/21 0149 07/08/21 0118 07/09/21 0308  NA 140  --  142 148* 150* 152*  K 5.1  --  4.0 3.7 3.8 3.6  CL 101  --  109 118* 118* 117*  CO2 25  --  24 25 29 31   GLUCOSE 472*  --  327* 157* 256* 248*  BUN 133*  --  118* 80* 54* 42*  CREATININE 5.10* 4.38* 3.63* 1.41* 1.03 0.97  CALCIUM 9.7  --  8.9 8.9 9.3 9.4  MG  --   --  2.6* 2.4 2.4 2.4  PHOS  --   --  5.0* 2.7 2.0* 2.4*   GFR: Estimated Creatinine Clearance: 68.1 mL/min (by C-G formula based on SCr of 0.97 mg/dL). Liver Function Tests: Recent Labs  Lab 07/05/21 2033  AST 78*  ALT 43  ALKPHOS 46  BILITOT 1.8*  PROT 6.6  ALBUMIN 3.0*   No results for input(s): LIPASE, AMYLASE in the last 168 hours. No results for input(s): AMMONIA in the last 168 hours. Coagulation Profile: Recent Labs  Lab 07/05/21 2033 07/06/21 0421 07/07/21 0149 07/08/21 0118 07/09/21 0308  INR 2.2* 2.4* 3.3* 3.2* 2.9*   Cardiac Enzymes: Recent Labs  Lab 07/07/21 0940 07/08/21 0118 07/08/21 0920 07/08/21 1849 07/09/21 0308  CKTOTAL 2,024* 1,005* 786* 656* 550*   BNP (last 3 results) No results for input(s): PROBNP in the last 8760 hours. HbA1C: No results for input(s): HGBA1C in the last 72 hours.  CBG: Recent Labs  Lab 07/07/21 2121 07/08/21 0757 07/08/21 1200 07/08/21 1538 07/08/21 2114  GLUCAP 198* 271* 160* 130* 217*   Lipid Profile: No results for input(s): CHOL, HDL, LDLCALC, TRIG, CHOLHDL, LDLDIRECT in the last 72 hours. Thyroid Function Tests: No results for input(s): TSH, T4TOTAL, FREET4, T3FREE, THYROIDAB in the last 72 hours.  Anemia Panel: No results for input(s): VITAMINB12, FOLATE, FERRITIN, TIBC, IRON, RETICCTPCT in the last 72 hours. Sepsis Labs: Recent Labs  Lab 07/05/21 2033 07/05/21 2241 07/06/21 0142 07/06/21 0421  LATICACIDVEN 2.8* 3.6* 2.3* 2.4*    Recent Results (from the past 240 hour(s))  Resp Panel by RT-PCR (Flu A&B, Covid) Nasopharyngeal Swab     Status: None   Collection Time:  07/05/21  8:22 PM   Specimen: Nasopharyngeal Swab; Nasopharyngeal(NP) swabs in vial transport medium  Result Value Ref Range Status   SARS Coronavirus 2 by RT PCR NEGATIVE NEGATIVE Final    Comment: (NOTE) SARS-CoV-2 target nucleic acids are NOT DETECTED.  The SARS-CoV-2 RNA is generally detectable in upper respiratory specimens during the acute phase of infection. The lowest concentration of SARS-CoV-2 viral copies this assay can detect is 138 copies/mL. A negative result does not preclude SARS-Cov-2 infection and should not be used as the sole basis for treatment or other patient management decisions. A negative result may occur with  improper specimen collection/handling, submission of specimen other than  nasopharyngeal swab, presence of viral mutation(s) within the areas targeted by this assay, and inadequate number of viral copies(<138 copies/mL). A negative result must be combined with clinical observations, patient history, and epidemiological information. The expected result is Negative.  Fact Sheet for Patients:  EntrepreneurPulse.com.au  Fact Sheet for Healthcare Providers:  IncredibleEmployment.be  This test is no t yet approved or cleared by the [place] FDA and  has been authorized for detection and/or diagnosis of SARS-CoV-2 by FDA under an Emergency Use Authorization (EUA). This EUA will remain  in effect (meaning this test can be used) for the duration of the COVID-19 declaration under Section 564(b)(1) of the Act, 21 U.S.C.section 360bbb-3(b)(1), unless the authorization is terminated  or revoked sooner.       Influenza A by PCR NEGATIVE NEGATIVE Final   Influenza B by PCR NEGATIVE NEGATIVE Final    Comment: (NOTE) The Xpert Xpress SARS-CoV-2/FLU/RSV plus assay is intended as an aid in the diagnosis of influenza from Nasopharyngeal swab specimens and should not be used as a sole basis for treatment. Nasal washings  and aspirates are unacceptable for Xpert Xpress SARS-CoV-2/FLU/RSV testing.  Fact Sheet for Patients: EntrepreneurPulse.com.au  Fact Sheet for Healthcare Providers: IncredibleEmployment.be  This test is not yet approved or cleared by the [place] FDA and has been authorized for detection and/or diagnosis of SARS-CoV-2 by FDA under an Emergency Use Authorization (EUA). This EUA will remain in effect (meaning this test can be used) for the duration of the COVID-19 declaration under Section 564(b)(1) of the Act, 21 U.S.C. section 360bbb-3(b)(1), unless the authorization is terminated or revoked.  Performed at [facility], [address] [place], [place] [address]   Blood Culture (routine x 2)     Status: None (Preliminary result)   Collection Time: 07/05/21  8:33 PM   Specimen: BLOOD  Result Value Ref Range Status   Specimen Description BLOOD RIGHT ANTECUBITAL  Final   Special Requests   Final    BOTTLES DRAWN AEROBIC AND ANAEROBIC Blood Culture results may not be optimal due to an inadequate volume of blood received in culture bottles   Culture   Final    NO GROWTH 3 DAYS Performed at [facility], [address] [place], [place] [address]    Report Status PENDING  Incomplete  Urine Culture     Status: None   Collection Time: 07/05/21  8:33 PM   Specimen: Urine, Random  Result Value Ref Range Status   Specimen Description   Final    URINE, RANDOM Performed at [facility], [address], [place], [place] [address]    Special Requests   Final    NONE Performed at [facility], [address] [place], [place] [address]    Culture   Final    NO GROWTH Performed at [facility], [place] [address] [place], [place] [address]    Report Status 07/07/2021 FINAL  Final  Blood Culture (routine x 2)     Status: None (Preliminary result)   Collection Time: 07/05/21  8:38 PM   Specimen: BLOOD   Result Value Ref Range Status   Specimen Description BLOOD BLOOD RIGHT FOREARM  Final   Special Requests   Final    BOTTLES DRAWN AEROBIC AND ANAEROBIC Blood Culture adequate volume   Culture   Final    NO GROWTH 3 DAYS Performed at [facility], [address] [place], [place] [address]    Report Status  PENDING  Incomplete  MRSA Next Gen by PCR, Nasal     Status: None   Collection Time: 07/06/21 12:34 AM   Specimen: Nasal Mucosa; Nasal Swab  Result Value Ref Range Status   MRSA by PCR Next Gen NOT DETECTED NOT DETECTED Final    Comment: (NOTE) The GeneXpert MRSA Assay (FDA approved for NASAL specimens only), is one component of a comprehensive MRSA colonization surveillance program. It is not intended to diagnose MRSA infection nor to guide or monitor treatment for MRSA infections. Test performance is not FDA approved in patients less than 27 years old. Performed at [facility], [address] [place], [place] [address]   MRSA Next Gen by PCR, Nasal     Status: None   Collection Time: 07/06/21 10:00 AM   Specimen: Nasal Mucosa; Nasal Swab  Result Value Ref Range Status   MRSA by PCR Next Gen NOT DETECTED NOT DETECTED Final    Comment: (NOTE) The GeneXpert MRSA Assay (FDA approved for NASAL specimens only), is one component of a comprehensive MRSA colonization surveillance program. It is not intended to diagnose MRSA infection nor to guide or monitor treatment for MRSA infections. Test performance is not FDA approved in patients less than 65 years old. Performed at [facility], [address] [place], [place] [address]          Radiology Studies: No results found.      Scheduled Meds:  vitamin C  500 mg Oral Daily   aspirin EC  81 mg Oral Daily   Chlorhexidine Gluconate Cloth  6 each Topical Daily   collagenase  1 application Topical Daily   docusate sodium  100 mg Oral BID   feeding supplement (NEPRO CARB STEADY)  237 mL  Oral BID BM   Gerhardt's butt cream   Topical BID   insulin aspart  0-20 Units Subcutaneous TID WC   insulin aspart  0-5 Units Subcutaneous QHS   insulin aspart  2 Units Subcutaneous TID WC   insulin glargine-yfgn  12 Units Subcutaneous Daily   metoprolol succinate  25 mg Oral BID   multivitamin with minerals  1 tablet Oral Daily   pantoprazole  40 mg Oral QHS   phosphorus  500 mg Oral Once   tamsulosin  0.4 mg Oral Daily   vitamin E  400 Units Oral Daily   warfarin  1 mg Oral ONCE-1600   Warfarin - Pharmacist Dosing Inpatient   Does not apply q1600   Continuous Infusions:  ampicillin-sulbactam (UNASYN) IV Stopped (07/09/21 0435)   dextrose 75 mL/hr at 07/09/21 0559     LOS: 4 days    Time spent:35 mins     Wyvonnia Dusky, MD [facility] Pager [phone]  If 7PM-7AM, please contact night-coverage 07/09/2021, 8:05 AM

## 2021-07-09 NOTE — TOC Initial Note (Signed)
Transition of Care (TOC) - Initial/Assessment Note    Patient Details  Name: Gabriel [name]. MRN: [id number] Date of Birth: 07-Feb-1932  Transition of Care [facility]) CM/SW Contact:    Gildardo Griffes, LCSW Phone Number: 07/09/2021, 12:33 PM  Clinical Narrative:                  CSW spoke with patient regarding SNF recommendation. He at first stated he would need a few days to think about it, then asked more questions about Short Term rehab. By end of consult patient did agree for CSW to send out referrals for bed offers and provide him with choices to make his decision.   CSW will fax out referrals pending bed offers.   Expected Discharge Plan: Skilled Nursing Facility Barriers to Discharge: Continued Medical Work up   Patient Goals and CMS Choice Patient states their goals for this hospitalization and ongoing recovery are:: to go to SNF CMS Medicare.gov Compare Post Acute Care list provided to:: Patient Choice offered to / list presented to : Patient  Expected Discharge Plan and Services Expected Discharge Plan: Skilled Nursing Facility     Post Acute Care Choice: Skilled Nursing Facility Living arrangements for the past 2 months: Single Family Home                                      Prior Living Arrangements/Services Living arrangements for the past 2 months: Single Family Home Lives with:: Self                   Activities of Daily Living      Permission Sought/Granted                  Emotional Assessment Appearance:: Appears stated age Attitude/Demeanor/Rapport: Gracious Affect (typically observed): Calm Orientation: : Oriented to Self, Oriented to Place, Oriented to  Time, Oriented to Situation Alcohol / Substance Use: Not Applicable Psych Involvement: No (comment)  Admission diagnosis:  Acute respiratory failure (HCC) [J96.00] Elevated troponin [R77.8] Elevated CK [R74.8] Acute kidney injury (HCC) [N17.9] Endotracheal tube present  [Z97.8] Altered mental status, unspecified altered mental status type [R41.82] Patient Active Problem List   Diagnosis Date Noted   On mechanically assisted ventilation (HCC) 07/06/2021   Uncontrolled type 2 diabetes mellitus (HCC) 07/06/2021   Acute metabolic encephalopathy 07/06/2021   Acute kidney injury (HCC) 07/06/2021   Pressure injury of skin of sacral region 07/06/2021   Altered mental status    Elevated CK    Acute respiratory failure (HCC) 07/05/2021   HLD (hyperlipidemia) 10/27/2015   Cerebrovascular accident, old 10/27/2015   Essential (primary) hypertension 09/25/2014   Pure hypercholesterolemia 09/25/2014   Type 2 diabetes mellitus (HCC) 09/25/2014   Cerebral arterial thrombosis 08/09/2011   Long term current use of anticoagulant 08/09/2011   PCP:  Dione Housekeeper, MD Pharmacy:   [facility] - [place], [place] - [place], [place] - [address] [place] [address] Phone: [phone] Fax: [phone]  [facility] - [place], [place] - [address] [place] [address] Phone: [phone] Fax: [phone]     Social Determinants of Health (SDOH) Interventions    Readmission Risk Interventions No flowsheet data found.

## 2021-07-09 NOTE — Progress Notes (Signed)
Physical Therapy Treatment Patient Details Name: Gabriel [name]. MRN: [id number] DOB: 21-May-1932 Today's Date: 07/09/2021    History of Present Illness 87 yo male presenting to [facility] ED via EMS after being found on the ground of his home unresponsive by police during a wellness check.  Per ED documentation and police report they were called to the patient's house having been called for a welfare check-in, apparently no one had seen him in ~4 days and he was covered in feces and insects when they found him.  Pt intubated for 1 day.    PT Comments    Pt seen for PT tx with pt noted to be incontinent of BM but pt aware he used restroom - encouraged pt to call for assistance to use bed pan in the future. Also ensured pt could press single push button call bell prior to end of session. Pt requires max assist +2 for rolling L<>R multiple times to allow staff to perform peri hygiene. Pt requires cuing to turn head/eyes to look at bed rail to increase ease of movement but pt does not. Pt then performs BLE strengthening exercises with active assist. Pt demonstrates BUE weakness, proximally greater than distally, with pt requiring assistance to drink thickened liquid. Will continue to follow pt acutely to progress bed mobility, endurance, sitting balance & transfers as able.     Follow Up Recommendations  SNF     Equipment Recommendations  None recommended by PT (TBD in next venue)    Recommendations for Other Services       Precautions / Restrictions Precautions Precautions: Fall Restrictions Weight Bearing Restrictions: No    Mobility  Bed Mobility Overal bed mobility: Needs Assistance Bed Mobility: Rolling Rolling: Max assist;+2 for physical assistance         General bed mobility comments: Cuing to turn head & look at bed rail but pt doesn't despite ongoing cuing, difficulty reaching for & grasping bed rail 2/2 weakness.    Transfers                     Ambulation/Gait                 Stairs             Wheelchair Mobility    Modified Rankin (Stroke Patients Only)       Balance                                            Cognition Arousal/Alertness: Awake/alert Behavior During Therapy: WFL for tasks assessed/performed Overall Cognitive Status: No family/caregiver present to determine baseline cognitive functioning                                        Exercises General Exercises - Lower Extremity Short Arc Quad: AAROM;Strengthening;Both;10 reps;Supine Heel Slides: AAROM;Strengthening;Both;10 reps;Supine Hip ABduction/ADduction: AROM;Strengthening;Both;10 reps;Supine (hip adduction pillow squeezes) Straight Leg Raises: AAROM;Strengthening;Both;10 reps;Supine    General Comments        Pertinent Vitals/Pain Pain Assessment: Faces Faces Pain Scale: Hurts a little bit Pain Location: posterior neck, posterior RLE Pain Descriptors / Indicators: Sore;Aching Pain Intervention(s): Limited activity within patient's tolerance;Monitored during session;Repositioned    Home Living  Prior Function            PT Goals (current goals can now be found in the care plan section) Acute Rehab PT Goals Patient Stated Goal: to go home PT Goal Formulation: With patient Time For Goal Achievement: 07/21/21 Potential to Achieve Goals: Poor Progress towards PT goals: Progressing toward goals    Frequency    Min 2X/week      PT Plan Current plan remains appropriate    Co-evaluation              AM-PAC PT "6 Clicks" Mobility   Outcome Measure  Help needed turning from your back to your side while in a flat bed without using bedrails?: Total Help needed moving from lying on your back to sitting on the side of a flat bed without using bedrails?: Total Help needed moving to and from a bed to a chair (including a wheelchair)?: Total Help  needed standing up from a chair using your arms (e.g., wheelchair or bedside chair)?: Total Help needed to walk in hospital room?: Total Help needed climbing 3-5 steps with a railing? : Total 6 Click Score: 6    End of Session   Activity Tolerance: Patient tolerated treatment well Patient left: in bed;with call bell/phone within reach;with bed alarm set Nurse Communication: Mobility status PT Visit Diagnosis: Muscle weakness (generalized) (M62.81);Difficulty in walking, not elsewhere classified (R26.2);History of falling (Z91.81)     Time: 0350-0938 PT Time Calculation (min) (ACUTE ONLY): 40 min  Charges:  $Therapeutic Exercise: 8-22 mins $Therapeutic Activity: 23-37 mins                     Aleda Grana, PT, DPT 07/09/21, 12:49 PM    Sandi Mariscal 07/09/2021, 12:47 PM

## 2021-07-09 NOTE — Plan of Care (Signed)
  Problem: Education: Goal: Knowledge of General Education information will improve Description: Including pain rating scale, medication(s)/side effects and non-pharmacologic comfort measures 07/09/2021 1856 by Ansel Bong, RN Outcome: Progressing 07/09/2021 1856 by Ansel Bong, RN Outcome: Progressing   Problem: Health Behavior/Discharge Planning: Goal: Ability to manage health-related needs will improve 07/09/2021 1856 by Ansel Bong, RN Outcome: Progressing 07/09/2021 1856 by Ansel Bong, RN Outcome: Progressing   Problem: Clinical Measurements: Goal: Ability to maintain clinical measurements within normal limits will improve 07/09/2021 1856 by Ansel Bong, RN Outcome: Progressing 07/09/2021 1856 by Ansel Bong, RN Outcome: Progressing Goal: Will remain free from infection 07/09/2021 1856 by Ansel Bong, RN Outcome: Progressing 07/09/2021 1856 by Ansel Bong, RN Outcome: Progressing Goal: Diagnostic test results will improve 07/09/2021 1856 by Ansel Bong, RN Outcome: Progressing 07/09/2021 1856 by Ansel Bong, RN Outcome: Progressing Goal: Respiratory complications will improve 07/09/2021 1856 by Ansel Bong, RN Outcome: Progressing 07/09/2021 1856 by Ansel Bong, RN Outcome: Progressing Goal: Cardiovascular complication will be avoided 07/09/2021 1856 by Ansel Bong, RN Outcome: Progressing 07/09/2021 1856 by Ansel Bong, RN Outcome: Progressing   Problem: Activity: Goal: Risk for activity intolerance will decrease 07/09/2021 1856 by Ansel Bong, RN Outcome: Progressing 07/09/2021 1856 by Ansel Bong, RN Outcome: Progressing   Problem: Nutrition: Goal: Adequate nutrition will be maintained 07/09/2021 1856 by Ansel Bong, RN Outcome: Progressing 07/09/2021 1856 by Ansel Bong, RN Outcome: Progressing   Problem: Coping: Goal: Level of anxiety will decrease 07/09/2021 1856 by Ansel Bong, RN Outcome:  Progressing 07/09/2021 1856 by Ansel Bong, RN Outcome: Progressing   Problem: Elimination: Goal: Will not experience complications related to bowel motility 07/09/2021 1856 by Ansel Bong, RN Outcome: Progressing 07/09/2021 1856 by Ansel Bong, RN Outcome: Progressing Goal: Will not experience complications related to urinary retention 07/09/2021 1856 by Ansel Bong, RN Outcome: Progressing 07/09/2021 1856 by Ansel Bong, RN Outcome: Progressing   Problem: Pain Managment: Goal: General experience of comfort will improve 07/09/2021 1856 by Ansel Bong, RN Outcome: Progressing 07/09/2021 1856 by Ansel Bong, RN Outcome: Progressing   Problem: Safety: Goal: Ability to remain free from injury will improve 07/09/2021 1856 by Ansel Bong, RN Outcome: Progressing 07/09/2021 1856 by Ansel Bong, RN Outcome: Progressing   Problem: Skin Integrity: Goal: Risk for impaired skin integrity will decrease 07/09/2021 1856 by Ansel Bong, RN Outcome: Progressing 07/09/2021 1856 by Ansel Bong, RN Outcome: Progressing

## 2021-07-09 NOTE — Progress Notes (Signed)
SLP Cancellation Note  Patient Details Name: Gabriel [name]. MRN: [id number] DOB: [birth year]   Cancelled treatment:       Reason Eval/Treat Not Completed: Fatigue/lethargy limiting ability to participate (chart reviewed). Pt has appeared tired this afternoon. ST held on tx session of trials to upgrade diet; good toleration of current dysphagia diet per NSG today. ST services will f/u next 1-3 days for ongoing assessment of swallowing and trials to upgrade diet. Recommend continue general aspiration precautions; dysphagia diet.     Jerilynn Som, MS, CCC-SLP Speech Language Pathologist Rehab Services [phone] [facility] 07/09/2021, 5:55 PM

## 2021-07-09 NOTE — Consult Note (Signed)
PHARMACY CONSULT NOTE  Pharmacy Consult for Electrolyte Monitoring and Replacement   Recent Labs: Potassium (mmol/L)  Date Value  07/09/2021 3.6  10/28/2014 4.0   Magnesium (mg/dL)  Date Value  82/95/6213 2.4  10/26/2014 2.1   Calcium (mg/dL)  Date Value  08/65/7846 9.4   Calcium, Total (mg/dL)  Date Value  96/29/5284 8.6   Albumin (g/dL)  Date Value  13/24/4010 3.0 (L)  10/24/2014 3.7   Phosphorus (mg/dL)  Date Value  27/25/3664 2.4 (L)   Sodium (mmol/L)  Date Value  07/09/2021 152 (H)  10/28/2014 144   Assessment: Patient is an 87 y/o M with medical history including history of CVA, diabetes, HLD, HTN, history of cerebral arterial thrombosis who was admitted after being found down, unresponsive after wellness check. Patient was intubated briefly from 7/25 - 7/26. Pharmacy consulted to assist with electrolyte monitoring and replacement as indicated.   Goal of Therapy:  Electrolytes within normal limits  Plan:  --Na trending up (148 >> 150 >> 152). Likely multifactorial from diminished oral intake / low grade fevers. Continue D5w at 75 mL/hr. Defer management to primary team --Phos 2.4, will replace orally with K Phos 500 mg x 1 --Follow-up electrolytes with AM labs tomorrow  Robyne Peers Mccayla Shimada 07/09/2021 7:05 AM

## 2021-07-09 NOTE — NC FL2 (Signed)
[place] MEDICAID FL2 LEVEL OF CARE SCREENING TOOL     IDENTIFICATION  Patient Name: Gabriel [name]. Birthdate: 04-Jul-1932 Sex: male Admission Date (Current Location): 07/05/2021  [facility] and [place] Number:  Chiropodist and Address:  [facility], [address] [place], [place] [address]      Provider Number: [id number]  Attending Physician Name and Address:  Charise Killian, MD  Relative Name and Phone Number:  Chanetta Marshall [phone]    Current Level of Care: Hospital Recommended Level of Care: Skilled Nursing Facility Prior Approval Number:    Date Approved/Denied:   PASRR Number: [id number]  Discharge Plan: SNF    Current Diagnoses: Patient Active Problem List   Diagnosis Date Noted   On mechanically assisted ventilation (HCC) 07/06/2021   Uncontrolled type 2 diabetes mellitus (HCC) 07/06/2021   Acute metabolic encephalopathy 07/06/2021   Acute kidney injury (HCC) 07/06/2021   Pressure injury of skin of sacral region 07/06/2021   Altered mental status    Elevated CK    Acute respiratory failure (HCC) 07/05/2021   HLD (hyperlipidemia) 10/27/2015   Cerebrovascular accident, old 10/27/2015   Essential (primary) hypertension 09/25/2014   Pure hypercholesterolemia 09/25/2014   Type 2 diabetes mellitus (HCC) 09/25/2014   Cerebral arterial thrombosis 08/09/2011   Long term current use of anticoagulant 08/09/2011    Orientation RESPIRATION BLADDER Height & Weight     Self, Time, Situation, Place  Normal Incontinent, External catheter Weight: 222 lb 10.6 oz (101 kg) Height:  6' 2.02" (188 cm)  BEHAVIORAL SYMPTOMS/MOOD NEUROLOGICAL BOWEL NUTRITION STATUS      Incontinent Diet (see discharge summary)  AMBULATORY STATUS COMMUNICATION OF NEEDS Skin   Extensive Assist Verbally Other (Comment) (pressure injuries on sacrum heels and head)                       Personal Care Assistance Level of Assistance  Bathing,  Feeding, Dressing, Total care Bathing Assistance: Limited assistance Feeding assistance: Independent Dressing Assistance: Limited assistance Total Care Assistance: Maximum assistance   Functional Limitations Info  Sight, Hearing, Speech Sight Info: Adequate Hearing Info: Adequate Speech Info: Adequate    SPECIAL CARE FACTORS FREQUENCY  PT (By licensed PT), OT (By licensed OT)     PT Frequency: min 4x weekly OT Frequency: min 4x weekly            Contractures Contractures Info: Not present    Additional Factors Info  Code Status, Allergies Code Status Info: full Allergies Info: darvon (propoxyphene), glipizide, saxagliptin           Current Medications (07/09/2021):  This is the current hospital active medication list Current Facility-Administered Medications  Medication Dose Route Frequency Provider Last Rate Last Admin   albuterol (PROVENTIL) (2.5 MG/3ML) 0.083% nebulizer solution 2.5 mg  2.5 mg Nebulization Q4H PRN Rust-Chester, Micheline Rough L, NP       Ampicillin-Sulbactam (UNASYN) 3 g in sodium chloride 0.9 % 100 mL IVPB  3 g Intravenous Q6H Charise Killian, MD 200 mL/hr at 07/09/21 1118 3 g at 07/09/21 1118   ascorbic acid (VITAMIN C) tablet 500 mg  500 mg Oral Daily Lianne Cure, NP   500 mg at 07/09/21 2376   aspirin EC tablet 81 mg  81 mg Oral Daily Lianne Cure, NP   81 mg at 07/09/21 2831   Chlorhexidine Gluconate Cloth 2 % PADS 6 each  6 each Topical Daily Salena Saner, MD  6 each at 07/09/21 0910   collagenase (SANTYL) ointment 1 application  1 application Topical Daily Salena Saner, MD   1 application at 07/09/21 1118   dextrose 5 % solution   Intravenous Continuous Charise Killian, MD 100 mL/hr at 07/09/21 0904 Rate Change at 07/09/21 0904   docusate sodium (COLACE) capsule 100 mg  100 mg Oral BID Tressie Ellis, RPH   100 mg at 07/08/21 3419   feeding supplement (NEPRO CARB STEADY) liquid 237 mL  237 mL Oral BID BM Salena Saner, MD    237 mL at 07/09/21 6222   Gerhardt's butt cream   Topical BID Salena Saner, MD   Given at 07/09/21 1118   insulin aspart (novoLOG) injection 0-20 Units  0-20 Units Subcutaneous TID WC Lianne Cure, NP   7 Units at 07/09/21 9798   insulin aspart (novoLOG) injection 0-5 Units  0-5 Units Subcutaneous QHS Lianne Cure, NP   2 Units at 07/08/21 2212   insulin aspart (novoLOG) injection 2 Units  2 Units Subcutaneous TID WC Lianne Cure, NP   2 Units at 07/09/21 0903   insulin glargine-yfgn (SEMGLEE) injection 12 Units  12 Units Subcutaneous Daily Lianne Cure, NP   12 Units at 07/09/21 9211   metoprolol succinate (TOPROL-XL) 24 hr tablet 25 mg  25 mg Oral BID Lianne Cure, NP   25 mg at 07/08/21 9417   multivitamin with minerals tablet 1 tablet  1 tablet Oral Daily Salena Saner, MD   1 tablet at 07/09/21 0900   pantoprazole (PROTONIX) EC tablet 40 mg  40 mg Oral QHS Tressie Ellis, RPH   40 mg at 07/08/21 2209   phosphorus (K PHOS NEUTRAL) tablet 500 mg  500 mg Oral Once Rauer, Robyne Peers, RPH       polyethylene glycol (MIRALAX / GLYCOLAX) packet 17 g  17 g Oral Daily PRN Rust-Chester, Cecelia Byars, NP       tamsulosin (FLOMAX) capsule 0.4 mg  0.4 mg Oral Daily Graves, Dana E, NP   0.4 mg at 07/09/21 4081   vitamin E capsule 400 Units  400 Units Oral Daily Lianne Cure, NP   400 Units at 07/09/21 4481   warfarin (COUMADIN) tablet 1 mg  1 mg Oral ONCE-1600 Rauer, Robyne Peers, RPH       Warfarin - Pharmacist Dosing Inpatient   Does not apply q1600 Salena Saner, MD   Given at 07/08/21 2217     Discharge Medications: Please see discharge summary for a list of discharge medications.  Relevant Imaging Results:  Relevant Lab Results:   Additional Information SSN: [id number]  Gildardo Griffes, LCSW

## 2021-07-09 NOTE — Consult Note (Signed)
Pharmacy Antibiotic Note  Gabriel [name]. is a 87 y.o. male admitted on 07/05/2021 with aspiration pneumonia. Patient found down at home unresponsive by police during wellness check. Patient intubated 7/25 and extubated 7/26. Pharmacy has been consulted for Unasyn dosing.  Plan: Continue Unasyn to 3 g IV q6h for a total of 7 days. Stop date is in place Monitor renal function and adjust dose as clinically indicated  Height: 6' 2.02" (188 cm) Weight: 101 kg (222 lb 10.6 oz) IBW/kg (Calculated) : 82.24  Temp (24hrs), Avg:98.9 F (37.2 C), Min:98.4 F (36.9 C), Max:99.5 F (37.5 C)  Recent Labs  Lab 07/05/21 2033 07/05/21 2241 07/06/21 0142 07/06/21 0421 07/07/21 0149 07/08/21 0118 07/09/21 0308  WBC 13.7* 15.1*  --  11.8* 9.9  --  10.4  CREATININE 5.10* 4.38*  --  3.63* 1.41* 1.03 0.97  LATICACIDVEN 2.8* 3.6* 2.3* 2.4*  --   --   --      Estimated Creatinine Clearance: 68.1 mL/min (by C-G formula based on SCr of 0.97 mg/dL).    Allergies  Allergen Reactions   Darvon [Propoxyphene] Hives   Glipizide Other (See Comments)   Saxagliptin Other (See Comments)    Antimicrobials this admission: Cefepime/Vanc/Flagyl 7/25 x 1 Azithromycin 7/26 x 1 Clarithromycin 7/26 >> 7/26 Unasyn 7/26 >> 8/2  Microbiology results: 7/26 MRSA PCR: (-) 7/25 BCx: NGTD 7/25 UCx: NG  Thank you for allowing pharmacy to be a part of this patient's care.  Robyne Peers Luqman Perrelli 07/09/2021 11:42 AM

## 2021-07-09 NOTE — Progress Notes (Signed)
ANTICOAGULATION CONSULT NOTE  Pharmacy Consult for warfarin Indication: h/o cerebral arterial thrombosis  Patient Measurements: Height: 6' 2.02" (188 cm) Weight: 101 kg (222 lb 10.6 oz) IBW/kg (Calculated) : 82.24  Labs: Recent Labs    07/07/21 0149 07/07/21 0832 07/07/21 0940 07/08/21 0118 07/08/21 0920 07/08/21 1849 07/09/21 0308  HGB 16.7  --   --   --   --   --  15.2  HCT 48.9  --   --   --   --   --  45.8  PLT 176  --   --   --   --   --  175  LABPROT 33.6*  --   --  32.6*  --   --  30.4*  INR 3.3*  --   --  3.2*  --   --  2.9*  CREATININE 1.41*  --   --  1.03  --   --  0.97  CKTOTAL 2,952*   < > 2,024* 1,005* 786* 656* 550*  TROPONINIHS  --   --  88*  --   --   --   --    < > = values in this interval not displayed.     Estimated Creatinine Clearance: 68.1 mL/min (by C-G formula based on SCr of 0.97 mg/dL).   Medical History: Past Medical History:  Diagnosis Date   Cerebral arterial thrombosis 08/09/2011   Cerebrovascular accident, old 10/27/2015   Diabetes mellitus without complication (HCC)    Metformin   Essential (primary) hypertension 09/25/2014   HLD (hyperlipidemia) 10/27/2015   Hypertension    Nephrolithiasis    Pure hypercholesterolemia 09/25/2014   Type 2 diabetes mellitus (HCC) 09/25/2014    Assessment: 87 year old male on warfarin for h/o cerebral arterial thrombosis. Home dose of warfarin 5 mg daily. Patient presented after having been found down at his home; he was briefly intubated. Pharmacy consulted to restart warfarin.  LFTs: Mild transaminitis on admission. Albumin 3. Tbili 1.8 DDI: Unasyn  Date INR Plan  7/25 2.2 Last dose unk.  7/26 2.4 Warfarin 2.5 mg  7/27 3.3 Hold  7/28 3.2 Hold  7/29 2.9     Goal of Therapy:  INR 2-3   Plan:  --INR 2.9 today; anticipate INR will trend down again tomorrow due to holding for 2 days. Will order warfarin 1mg  tonight --Daily INR per protocol --CBC at least every 3 days per  protocol  Gabriel [name] 07/09/2021,7:09 AM

## 2021-07-10 DIAGNOSIS — G9341 Metabolic encephalopathy: Secondary | ICD-10-CM | POA: Diagnosis not present

## 2021-07-10 DIAGNOSIS — E870 Hyperosmolality and hypernatremia: Secondary | ICD-10-CM | POA: Diagnosis not present

## 2021-07-10 DIAGNOSIS — J690 Pneumonitis due to inhalation of food and vomit: Secondary | ICD-10-CM | POA: Diagnosis not present

## 2021-07-10 LAB — CBC
HCT: 47.1 % (ref 39.0–52.0)
Hemoglobin: 15.6 g/dL (ref 13.0–17.0)
MCH: 34.4 pg — ABNORMAL HIGH (ref 26.0–34.0)
MCHC: 33.1 g/dL (ref 30.0–36.0)
MCV: 104 fL — ABNORMAL HIGH (ref 80.0–100.0)
Platelets: 175 10*3/uL (ref 150–400)
RBC: 4.53 MIL/uL (ref 4.22–5.81)
RDW: 13.2 % (ref 11.5–15.5)
WBC: 10.8 10*3/uL — ABNORMAL HIGH (ref 4.0–10.5)
nRBC: 0.4 % — ABNORMAL HIGH (ref 0.0–0.2)

## 2021-07-10 LAB — BASIC METABOLIC PANEL
Anion gap: 5 (ref 5–15)
BUN: 32 mg/dL — ABNORMAL HIGH (ref 8–23)
CO2: 32 mmol/L (ref 22–32)
Calcium: 9.7 mg/dL (ref 8.9–10.3)
Chloride: 113 mmol/L — ABNORMAL HIGH (ref 98–111)
Creatinine, Ser: 0.75 mg/dL (ref 0.61–1.24)
GFR, Estimated: >60 mL/min (ref >60)
Glucose, Bld: 252 mg/dL — ABNORMAL HIGH (ref 70–99)
Potassium: 3.8 mmol/L (ref 3.5–5.1)
Sodium: 150 mmol/L — ABNORMAL HIGH (ref 135–145)

## 2021-07-10 LAB — PROTIME-INR
INR: 1.5 — ABNORMAL HIGH (ref 0.8–1.2)
Prothrombin Time: 17.8 seconds — ABNORMAL HIGH (ref 11.4–15.2)

## 2021-07-10 LAB — MAGNESIUM: Magnesium: 2.3 mg/dL (ref 1.7–2.4)

## 2021-07-10 LAB — CK: Total CK: 288 U/L (ref 49–397)

## 2021-07-10 LAB — PHOSPHORUS: Phosphorus: 3.3 mg/dL (ref 2.5–4.6)

## 2021-07-10 LAB — GLUCOSE, CAPILLARY
Glucose-Capillary: 241 mg/dL — ABNORMAL HIGH (ref 70–99)
Glucose-Capillary: 286 mg/dL — ABNORMAL HIGH (ref 70–99)
Glucose-Capillary: 208 mg/dL — ABNORMAL HIGH (ref 70–99)
Glucose-Capillary: 163 mg/dL — ABNORMAL HIGH (ref 70–99)

## 2021-07-10 LAB — SODIUM: Sodium: 148 mmol/L — ABNORMAL HIGH (ref 135–145)

## 2021-07-10 MED ORDER — WARFARIN SODIUM 5 MG PO TABS
5.0000 mg | ORAL_TABLET | Freq: Once | ORAL | Status: AC
  Administered 2021-07-10: 5 mg via ORAL
  Filled 2021-07-10: qty 1

## 2021-07-10 NOTE — Progress Notes (Signed)
  Speech Language Pathology Treatment: Dysphagia  Patient Details Name: Gabriel [name]. MRN: [id number] DOB: Mar 28, 1932 Today's Date: 07/10/2021 Time: 1749-4496 SLP Time Calculation (min) (ACUTE ONLY): 18 min  Assessment / Plan / Recommendation Clinical Impression  Pt seen for ongoing dysphagia treatment. Pt was awake, alert able to express his basic wants and needs. SLP and nursing repositioned pt for safe PO intake. Skilled observation was provided of pt consuming thin liquids via straw and 2 bites of puree (pt had just finished breakfast and declined further boluses of puree). When consuming thin liquids via straw, pt's vitals remained stable and his voice remained clear throughout. Throughout consumption of 8 oz of thin liquids pt demonstrated 1 very delayed mild throat clear. Difficult to ascertain if throat clear related to swallow or pt's desire to talk throughout session. At this time, pt appears at reduced risk of aspiration when following general aspiration while consuming thin liquids via straw. Education provided to pt's nurse as well as his MD. ST to follow for diet toleration and possible upgrade of solids.     HPI HPI: 87 yo male presenting to [facility] ED via EMS after being found on the ground of his home unresponsive by police during a wellness check.  Per ED documentation and police report they were called to the patient's house for a welfare check possibly by a neighbor, and that no one had seen him for roughly 4 days.  When police arrived they found the patient down on the ground in the kitchen covered in feces and insects. Patient intubated emergently and placed on mechanical ventilation due to inability to protect airway in unresponsive state.  Sepsis protocol initiated and patient received 3.5 L of IV fluid resuscitation and empiric antibiotic coverage.  CXR reveals LLL atelectasis versus developing infiltrate.  Pt was extubated on 07/06/2021.      SLP Plan  Continue with current  plan of care       Recommendations  Diet recommendations: Dysphagia 1 (puree);Thin liquid Liquids provided via: Straw Medication Administration: Crushed with puree Supervision: Full supervision/cueing for compensatory strategies;Staff to assist with self feeding Compensations: Minimize environmental distractions;Slow rate;Small sips/bites Postural Changes and/or Swallow Maneuvers: Seated upright 90 degrees;Upright 30-60 min after meal                Oral Care Recommendations: Oral care BID;Oral care before and after PO;Staff/trained caregiver to provide oral care Follow up Recommendations:  (TBD) SLP Visit Diagnosis: Dysphagia, oropharyngeal phase (R13.12) Plan: Continue with current plan of care       GO               Anesha Hackert B. Dreama Saa M.S., CCC-SLP, [facility] Speech-Language Pathologist Rehabilitation Services Office [phone]  Gabriel [name] 07/10/2021, 9:55 AM

## 2021-07-10 NOTE — Consult Note (Signed)
PHARMACY CONSULT NOTE  Pharmacy Consult for Electrolyte Monitoring and Replacement   Recent Labs: Potassium (mmol/L)  Date Value  07/10/2021 3.8  10/28/2014 4.0   Magnesium (mg/dL)  Date Value  48/54/6270 2.3  10/26/2014 2.1   Calcium (mg/dL)  Date Value  35/00/9381 9.7   Calcium, Total (mg/dL)  Date Value  82/99/3716 8.6   Albumin (g/dL)  Date Value  96/78/9381 3.0 (L)  10/24/2014 3.7   Phosphorus (mg/dL)  Date Value  01/75/1025 3.3   Sodium (mmol/L)  Date Value  07/10/2021 150 (H)  10/28/2014 144   Assessment: Patient is an 87 y/o M with medical history including history of CVA, diabetes, HLD, HTN, history of cerebral arterial thrombosis who was admitted after being found down, unresponsive after wellness check. Patient was intubated briefly from 7/25 - 7/26. Pharmacy consulted to assist with electrolyte monitoring and replacement as indicated.   Goal of Therapy:  Electrolytes within normal limits  Plan:  Na trending up (148 >150 >152>150). Likely multifactorial from diminished oral intake / low grade fevers.  MIVF D5W continues @ 110 mL/hr. [I/O +3.5L] Defer management to primary team Phos 2.4>3.3, after K Phos PO 500 mg x 1 yesterday; no further repletion warranted today. All other lytes remain WNL.  --Follow-up electrolytes with AM labs tomorrow  Martyn Malay, PharmD, [facility] 07/10/2021 10:55 AM

## 2021-07-10 NOTE — Plan of Care (Signed)
  Problem: Education: Goal: Knowledge of General Education information will improve Description: Including pain rating scale, medication(s)/side effects and non-pharmacologic comfort measures 07/10/2021 1347 by Ansel Bong, RN Outcome: Progressing 07/10/2021 1346 by Ansel Bong, RN Outcome: Progressing   Problem: Health Behavior/Discharge Planning: Goal: Ability to manage health-related needs will improve 07/10/2021 1347 by Ansel Bong, RN Outcome: Progressing 07/10/2021 1346 by Ansel Bong, RN Outcome: Progressing   Problem: Clinical Measurements: Goal: Ability to maintain clinical measurements within normal limits will improve 07/10/2021 1347 by Ansel Bong, RN Outcome: Progressing 07/10/2021 1346 by Ansel Bong, RN Outcome: Progressing Goal: Will remain free from infection 07/10/2021 1347 by Ansel Bong, RN Outcome: Progressing 07/10/2021 1346 by Ansel Bong, RN Outcome: Progressing Goal: Diagnostic test results will improve 07/10/2021 1347 by Ansel Bong, RN Outcome: Progressing 07/10/2021 1346 by Ansel Bong, RN Outcome: Progressing Goal: Respiratory complications will improve 07/10/2021 1347 by Ansel Bong, RN Outcome: Progressing 07/10/2021 1346 by Ansel Bong, RN Outcome: Progressing Goal: Cardiovascular complication will be avoided 07/10/2021 1347 by Ansel Bong, RN Outcome: Progressing 07/10/2021 1346 by Ansel Bong, RN Outcome: Progressing   Problem: Activity: Goal: Risk for activity intolerance will decrease 07/10/2021 1347 by Ansel Bong, RN Outcome: Progressing 07/10/2021 1346 by Ansel Bong, RN Outcome: Progressing   Problem: Nutrition: Goal: Adequate nutrition will be maintained 07/10/2021 1347 by Ansel Bong, RN Outcome: Progressing 07/10/2021 1346 by Ansel Bong, RN Outcome: Progressing   Problem: Coping: Goal: Level of anxiety will decrease 07/10/2021 1347 by Ansel Bong, RN Outcome:  Progressing 07/10/2021 1346 by Ansel Bong, RN Outcome: Progressing   Problem: Elimination: Goal: Will not experience complications related to bowel motility 07/10/2021 1347 by Ansel Bong, RN Outcome: Progressing 07/10/2021 1346 by Ansel Bong, RN Outcome: Progressing Goal: Will not experience complications related to urinary retention 07/10/2021 1347 by Ansel Bong, RN Outcome: Progressing 07/10/2021 1346 by Ansel Bong, RN Outcome: Progressing   Problem: Pain Managment: Goal: General experience of comfort will improve 07/10/2021 1347 by Ansel Bong, RN Outcome: Progressing 07/10/2021 1346 by Ansel Bong, RN Outcome: Progressing   Problem: Safety: Goal: Ability to remain free from injury will improve 07/10/2021 1347 by Ansel Bong, RN Outcome: Progressing 07/10/2021 1346 by Ansel Bong, RN Outcome: Progressing   Problem: Skin Integrity: Goal: Risk for impaired skin integrity will decrease 07/10/2021 1347 by Ansel Bong, RN Outcome: Progressing 07/10/2021 1346 by Ansel Bong, RN Outcome: Progressing

## 2021-07-10 NOTE — Progress Notes (Signed)
PROGRESS NOTE    Gabriel [name].  MGN:[id number] DOB: 07-Jul-1932 DOA: 07/05/2021 PCP: Valera Castle, MD    Assessment & Plan:   Active Problems:   Acute respiratory failure (HCC)   Elevated CK   On mechanically assisted ventilation (HCC)   Uncontrolled type 2 diabetes mellitus ([place])   Acute metabolic encephalopathy   Acute kidney injury ([place])   Pressure injury of skin of sacral region   Sepsis: met criteria w/ leukocytosis, fever, tachycardia, elevated lactic acid & aspiration pneumonia. Blood cxs NGTD. Urine cx no growth. Continue on IV unasyn. Resolved   Aspiration pneumonia: continue on IV unasyn x 7 days, bronchodilators & encourage incentive spirometry   Hypernatremia: free water deficit is 3.7L. Continue on D5W. Repeat Na level ordered.   Hypophosphatemia: WNL today    AKI: resolved   Rhabdomyolysis: resolved    Acute metabolic encephalopathy: likely secondary to all above. Hx of CVA, cerebral arterial thrombosis, on warfarin. Pharmacy to monitor & dose warfarin   Elevated troponin: now with ST Elevation 07/27. Echo 07/26 EF 65 to 70% with moderate asymmetric left ventricular hypertrophy of the septal segment; mildly increased right ventricular wall thickness, RBBB, bi-atrial enlargement & borderline LAFB noted on 12 lead. Favor demand ischemia as per ICU    DM2: uncontrolled, HbA1c 8.8. Continue on glargine, SSI w/ accuchecks  Dysphagia: continue on dysphagia I diet as per speech   HLD: can restart statin tomorrow    HTN: continue on metoprolol   Sacral Pressure injury & scattered abrasions: present on admission. Unknown stage. Wound care consulted    DVT prophylaxis: warfarin  Code Status: full  Family Communication:  Disposition Plan: therapy recs SNF  Level of care: Progressive Cardiac  Status is: Inpatient  Remains inpatient appropriate because:Ongoing diagnostic testing needed not appropriate for outpatient work up, Unsafe d/c plan, IV  treatments appropriate due to intensity of illness or inability to take PO, and Inpatient level of care appropriate due to severity of illness  Dispo: The patient is from: Home              Anticipated d/c is to: SNF               Patient currently is not medically stable to d/c.   Difficult to place patient : unclear   Consultants:  Palliative care ICU  Procedures:   Antimicrobials:   Subjective: Pt c/o fatigue   Objective: Vitals:   07/09/21 1211 07/09/21 1731 07/09/21 2035 07/10/21 0500  BP: (!) 120/57 116/61 (!) 145/75   Pulse: 62 64 68   Resp: 17 18 18    Temp: 98.5 F (36.9 C) 98.1 F (36.7 C) 98.5 F (36.9 C)   TempSrc:      SpO2: 93% 96% 92%   Weight:    102.4 kg  Height:        Intake/Output Summary (Last 24 hours) at 07/10/2021 0801 Last data filed at 07/09/2021 1800 Gross per 24 hour  Intake 640 ml  Output 1250 ml  Net -610 ml   Filed Weights   07/07/21 0500 07/08/21 0500 07/10/21 0500  Weight: 100 kg 101 kg 102.4 kg    Examination:  General exam: Appears uncomfortable  Respiratory system: diminished breath sounds b/l Cardiovascular system: S1 & S2+. No rubs or clicks   Gastrointestinal system: Abd is soft, NT, ND & hypoactive bowel sounds Central nervous system: Alert and awake. Moves all extremities   Psychiatry: Judgement and insight appear abnormal. Flat mood  and affect    Data Reviewed: I have personally reviewed following labs and imaging studies  CBC: Recent Labs  Lab 07/05/21 2033 07/05/21 2241 07/06/21 0421 07/07/21 0149 07/09/21 0308 07/10/21 0541  WBC 13.7* 15.1* 11.8* 9.9 10.4 10.8*  NEUTROABS 11.1*  --   --  8.0*  --   --   HGB 17.6* 16.3 16.3 16.7 15.2 15.6  HCT 52.0 49.3 49.2 48.9 45.8 47.1  MCV 101.8* 103.4* 103.6* 100.8* 104.1* 104.0*  PLT 278 213 194 176 175 156   Basic Metabolic Panel: Recent Labs  Lab 07/06/21 0421 07/07/21 0149 07/08/21 0118 07/09/21 0308 07/09/21 1450 07/10/21 0541  NA 142 148* 150*  152* 152* 150*  K 4.0 3.7 3.8 3.6  --  3.8  CL 109 118* 118* 117*  --  113*  CO2 24 25 29 31   --  32  GLUCOSE 327* 157* 256* 248*  --  252*  BUN 118* 80* 54* 42*  --  32*  CREATININE 3.63* 1.41* 1.03 0.97  --  0.75  CALCIUM 8.9 8.9 9.3 9.4  --  9.7  MG 2.6* 2.4 2.4 2.4  --  2.3  PHOS 5.0* 2.7 2.0* 2.4*  --  3.3   GFR: Estimated Creatinine Clearance: 83.1 mL/min (by C-G formula based on SCr of 0.75 mg/dL). Liver Function Tests: Recent Labs  Lab 07/05/21 2033  AST 78*  ALT 43  ALKPHOS 46  BILITOT 1.8*  PROT 6.6  ALBUMIN 3.0*   No results for input(s): LIPASE, AMYLASE in the last 168 hours. No results for input(s): AMMONIA in the last 168 hours. Coagulation Profile: Recent Labs  Lab 07/06/21 0421 07/07/21 0149 07/08/21 0118 07/09/21 0308 07/10/21 0541  INR 2.4* 3.3* 3.2* 2.9* 1.5*   Cardiac Enzymes: Recent Labs  Lab 07/08/21 0920 07/08/21 1849 07/09/21 0308 07/09/21 0923 07/10/21 0541  CKTOTAL 786* 656* 550* 455* 288   BNP (last 3 results) No results for input(s): PROBNP in the last 8760 hours. HbA1C: No results for input(s): HGBA1C in the last 72 hours.  CBG: Recent Labs  Lab 07/08/21 2114 07/09/21 0838 07/09/21 1214 07/09/21 1731 07/09/21 2045  GLUCAP 217* 246* 317* 222* 270*   Lipid Profile: No results for input(s): CHOL, HDL, LDLCALC, TRIG, CHOLHDL, LDLDIRECT in the last 72 hours. Thyroid Function Tests: No results for input(s): TSH, T4TOTAL, FREET4, T3FREE, THYROIDAB in the last 72 hours.  Anemia Panel: No results for input(s): VITAMINB12, FOLATE, FERRITIN, TIBC, IRON, RETICCTPCT in the last 72 hours. Sepsis Labs: Recent Labs  Lab 07/05/21 2033 07/05/21 2241 07/06/21 0142 07/06/21 0421  LATICACIDVEN 2.8* 3.6* 2.3* 2.4*    Recent Results (from the past 240 hour(s))  Resp Panel by RT-PCR (Flu A&B, Covid) Nasopharyngeal Swab     Status: None   Collection Time: 07/05/21  8:22 PM   Specimen: Nasopharyngeal Swab; Nasopharyngeal(NP) swabs in  vial transport medium  Result Value Ref Range Status   SARS Coronavirus 2 by RT PCR NEGATIVE NEGATIVE Final    Comment: (NOTE) SARS-CoV-2 target nucleic acids are NOT DETECTED.  The SARS-CoV-2 RNA is generally detectable in upper respiratory specimens during the acute phase of infection. The lowest concentration of SARS-CoV-2 viral copies this assay can detect is 138 copies/mL. A negative result does not preclude SARS-Cov-2 infection and should not be used as the sole basis for treatment or other patient management decisions. A negative result may occur with  improper specimen collection/handling, submission of specimen other than nasopharyngeal swab, presence of viral mutation(s)  within the areas targeted by this assay, and inadequate number of viral copies(<138 copies/mL). A negative result must be combined with clinical observations, patient history, and epidemiological information. The expected result is Negative.  Fact Sheet for Patients:  EntrepreneurPulse.com.au  Fact Sheet for Healthcare Providers:  IncredibleEmployment.be  This test is no t yet approved or cleared by the [place] FDA and  has been authorized for detection and/or diagnosis of SARS-CoV-2 by FDA under an Emergency Use Authorization (EUA). This EUA will remain  in effect (meaning this test can be used) for the duration of the COVID-19 declaration under Section 564(b)(1) of the Act, 21 U.S.C.section 360bbb-3(b)(1), unless the authorization is terminated  or revoked sooner.       Influenza A by PCR NEGATIVE NEGATIVE Final   Influenza B by PCR NEGATIVE NEGATIVE Final    Comment: (NOTE) The Xpert Xpress SARS-CoV-2/FLU/RSV plus assay is intended as an aid in the diagnosis of influenza from Nasopharyngeal swab specimens and should not be used as a sole basis for treatment. Nasal washings and aspirates are unacceptable for Xpert Xpress SARS-CoV-2/FLU/RSV testing.  Fact  Sheet for Patients: EntrepreneurPulse.com.au  Fact Sheet for Healthcare Providers: IncredibleEmployment.be  This test is not yet approved or cleared by the [place] FDA and has been authorized for detection and/or diagnosis of SARS-CoV-2 by FDA under an Emergency Use Authorization (EUA). This EUA will remain in effect (meaning this test can be used) for the duration of the COVID-19 declaration under Section 564(b)(1) of the Act, 21 U.S.C. section 360bbb-3(b)(1), unless the authorization is terminated or revoked.  Performed at [facility], [address] [place], [place] [address]   Blood Culture (routine x 2)     Status: None (Preliminary result)   Collection Time: 07/05/21  8:33 PM   Specimen: BLOOD  Result Value Ref Range Status   Specimen Description BLOOD RIGHT ANTECUBITAL  Final   Special Requests   Final    BOTTLES DRAWN AEROBIC AND ANAEROBIC Blood Culture results may not be optimal due to an inadequate volume of blood received in culture bottles   Culture   Final    NO GROWTH 4 DAYS Performed at [facility], [address] [place], [place] [address]    Report Status PENDING  Incomplete  Urine Culture     Status: None   Collection Time: 07/05/21  8:33 PM   Specimen: Urine, Random  Result Value Ref Range Status   Specimen Description   Final    URINE, RANDOM Performed at [facility], [address] [place], [place] [address]    Special Requests   Final    NONE Performed at [facility], [address], [place], [place] [address]    Culture   Final    NO GROWTH Performed at [facility], [place] [address] [place], [place] [address]    Report Status 07/07/2021 FINAL  Final  Blood Culture (routine x 2)     Status: None (Preliminary result)   Collection Time: 07/05/21  8:38 PM   Specimen: BLOOD  Result Value Ref Range Status   Specimen Description BLOOD BLOOD RIGHT FOREARM   Final   Special Requests   Final    BOTTLES DRAWN AEROBIC AND ANAEROBIC Blood Culture adequate volume   Culture   Final    NO GROWTH 4 DAYS Performed at [facility], [address] [place], [place] [address]    Report Status PENDING  Incomplete  MRSA Next  Gen by PCR, Nasal     Status: None   Collection Time: 07/06/21 12:34 AM   Specimen: Nasal Mucosa; Nasal Swab  Result Value Ref Range Status   MRSA by PCR Next Gen NOT DETECTED NOT DETECTED Final    Comment: (NOTE) The GeneXpert MRSA Assay (FDA approved for NASAL specimens only), is one component of a comprehensive MRSA colonization surveillance program. It is not intended to diagnose MRSA infection nor to guide or monitor treatment for MRSA infections. Test performance is not FDA approved in patients less than 15 years old. Performed at [facility], [address] [place], [place] [address]   MRSA Next Gen by PCR, Nasal     Status: None   Collection Time: 07/06/21 10:00 AM   Specimen: Nasal Mucosa; Nasal Swab  Result Value Ref Range Status   MRSA by PCR Next Gen NOT DETECTED NOT DETECTED Final    Comment: (NOTE) The GeneXpert MRSA Assay (FDA approved for NASAL specimens only), is one component of a comprehensive MRSA colonization surveillance program. It is not intended to diagnose MRSA infection nor to guide or monitor treatment for MRSA infections. Test performance is not FDA approved in patients less than 36 years old. Performed at [facility], [address], [place], [place] [address]          Radiology Studies: No results found.      Scheduled Meds:  vitamin C  500 mg Oral Daily   aspirin EC  81 mg Oral Daily   Chlorhexidine Gluconate Cloth  6 each Topical Daily   collagenase  1 application Topical Daily   docusate sodium  100 mg Oral BID   feeding supplement (NEPRO CARB STEADY)  237 mL Oral BID BM   Gerhardt's butt cream   Topical BID   insulin aspart  0-20 Units  Subcutaneous TID WC   insulin aspart  0-5 Units Subcutaneous QHS   insulin aspart  2 Units Subcutaneous TID WC   insulin glargine-yfgn  12 Units Subcutaneous Daily   metoprolol succinate  25 mg Oral BID   multivitamin with minerals  1 tablet Oral Daily   pantoprazole  40 mg Oral QHS   tamsulosin  0.4 mg Oral Daily   vitamin E  400 Units Oral Daily   Warfarin - Pharmacist Dosing Inpatient   Does not apply q1600   Continuous Infusions:  ampicillin-sulbactam (UNASYN) IV 3 g (07/10/21 0359)   dextrose 100 mL/hr at 07/09/21 0904     LOS: 5 days    Time spent:35 mins     Wyvonnia Dusky, MD [facility] Pager [phone]  If 7PM-7AM, please contact night-coverage 07/10/2021, 8:01 AM

## 2021-07-10 NOTE — Progress Notes (Signed)
ANTICOAGULATION CONSULT NOTE  Pharmacy Consult for warfarin Indication: h/o cerebral arterial thrombosis  Patient Measurements: Height: 6' 2.02" (188 cm) Weight: 102.4 kg (225 lb 12 oz) IBW/kg (Calculated) : 82.24  Labs: Recent Labs    07/08/21 0118 07/08/21 0920 07/09/21 0308 07/09/21 0923 07/10/21 0541  HGB  --   --  15.2  --  15.6  HCT  --   --  45.8  --  47.1  PLT  --   --  175  --  175  LABPROT 32.6*  --  30.4*  --  17.8*  INR 3.2*  --  2.9*  --  1.5*  CREATININE 1.03  --  0.97  --  0.75  CKTOTAL 1,005*   < > 550* 455* 288   < > = values in this interval not displayed.     Estimated Creatinine Clearance: 83.1 mL/min (by C-G formula based on SCr of 0.75 mg/dL).   Medical History: Past Medical History:  Diagnosis Date   Cerebral arterial thrombosis 08/09/2011   Cerebrovascular accident, old 10/27/2015   Diabetes mellitus without complication (HCC)    Metformin   Essential (primary) hypertension 09/25/2014   HLD (hyperlipidemia) 10/27/2015   Hypertension    Nephrolithiasis    Pure hypercholesterolemia 09/25/2014   Type 2 diabetes mellitus (HCC) 09/25/2014    Assessment: 87 year old male on warfarin for h/o cerebral arterial thrombosis. Home dose of warfarin 5 mg daily. Patient presented after having been found down at his home; he was briefly intubated. Pharmacy consulted to restart warfarin.  7/26-27: INR on arrival in goal, but clarithro x1 & unasyn started 7/26 INR inc'd to 3.3 following day requing hold x2d (7/27-28). 7/29: resumed dosing @1mg   LFTs: Mild transaminitis on admission. Albumin 3. Tbili 1.8 DDI: Unasyn  Date INR Plan  7/25 2.2 Last dose unk.  7/26 2.4 Warfarin 2.5 mg  7/27 3.3 Hold  7/28 3.2 Hold  7/29 2.9 1 mg  7/30 1.5     Goal of Therapy:  INR 2-3   Plan:  INR 2.9>1.5 (delta 1.4) with 1mg  x1 last night after 2days of holding. This is an expected drop given 2d hold and 20% of home dose last night. Will dose 5mg  x1 since DDI  persist ISO Unasyn and expect them to remain sensitive, so will dose more cautiously with subsequent doses. --Daily INR per protocol --CBC at least every 3 days per protocol  8/30 07/10/2021,11:02 AM

## 2021-07-11 DIAGNOSIS — E870 Hyperosmolality and hypernatremia: Secondary | ICD-10-CM | POA: Diagnosis not present

## 2021-07-11 DIAGNOSIS — J690 Pneumonitis due to inhalation of food and vomit: Secondary | ICD-10-CM | POA: Diagnosis not present

## 2021-07-11 DIAGNOSIS — G9341 Metabolic encephalopathy: Secondary | ICD-10-CM | POA: Diagnosis not present

## 2021-07-11 LAB — GLUCOSE, CAPILLARY
Glucose-Capillary: 252 mg/dL — ABNORMAL HIGH (ref 70–99)
Glucose-Capillary: 266 mg/dL — ABNORMAL HIGH (ref 70–99)
Glucose-Capillary: 182 mg/dL — ABNORMAL HIGH (ref 70–99)
Glucose-Capillary: 136 mg/dL — ABNORMAL HIGH (ref 70–99)

## 2021-07-11 LAB — CBC
HCT: 47.8 % (ref 39.0–52.0)
Hemoglobin: 16 g/dL (ref 13.0–17.0)
MCH: 33.5 pg (ref 26.0–34.0)
MCHC: 33.5 g/dL (ref 30.0–36.0)
MCV: 100.2 fL — ABNORMAL HIGH (ref 80.0–100.0)
Platelets: 210 10*3/uL (ref 150–400)
RBC: 4.77 MIL/uL (ref 4.22–5.81)
RDW: 12.9 % (ref 11.5–15.5)
WBC: 13.2 10*3/uL — ABNORMAL HIGH (ref 4.0–10.5)
nRBC: 0.2 % (ref 0.0–0.2)

## 2021-07-11 LAB — BASIC METABOLIC PANEL
Anion gap: 9 (ref 5–15)
BUN: 25 mg/dL — ABNORMAL HIGH (ref 8–23)
CO2: 30 mmol/L (ref 22–32)
Calcium: 9.2 mg/dL (ref 8.9–10.3)
Chloride: 106 mmol/L (ref 98–111)
Creatinine, Ser: 0.84 mg/dL (ref 0.61–1.24)
GFR, Estimated: >60 mL/min (ref >60)
Glucose, Bld: 224 mg/dL — ABNORMAL HIGH (ref 70–99)
Potassium: 3.5 mmol/L (ref 3.5–5.1)
Sodium: 145 mmol/L (ref 135–145)

## 2021-07-11 LAB — PROTIME-INR
INR: 1.2 (ref 0.8–1.2)
Prothrombin Time: 15.4 seconds — ABNORMAL HIGH (ref 11.4–15.2)

## 2021-07-11 LAB — MAGNESIUM: Magnesium: 2 mg/dL (ref 1.7–2.4)

## 2021-07-11 LAB — PHOSPHORUS: Phosphorus: 2.9 mg/dL (ref 2.5–4.6)

## 2021-07-11 MED ORDER — SODIUM CHLORIDE 0.9 % IV SOLN
INTRAVENOUS | Status: DC | PRN
  Administered 2021-07-11 – 2021-07-13 (×2): 250 mL via INTRAVENOUS

## 2021-07-11 MED ORDER — ATORVASTATIN CALCIUM 20 MG PO TABS
40.0000 mg | ORAL_TABLET | Freq: Every day | ORAL | Status: DC
  Administered 2021-07-11 – 2021-07-14 (×4): 40 mg via ORAL
  Filled 2021-07-11 (×4): qty 2

## 2021-07-11 MED ORDER — WARFARIN SODIUM 6 MG PO TABS
6.0000 mg | ORAL_TABLET | Freq: Once | ORAL | Status: AC
  Administered 2021-07-11: 6 mg via ORAL
  Filled 2021-07-11: qty 1

## 2021-07-11 NOTE — Progress Notes (Signed)
ANTICOAGULATION CONSULT NOTE  Pharmacy Consult for warfarin Indication: h/o cerebral arterial thrombosis  Patient Measurements: Height: 6' 2.02" (188 cm) Weight: 102.4 kg (225 lb 12 oz) IBW/kg (Calculated) : 82.24  Labs: Recent Labs    07/09/21 0308 07/09/21 0923 07/10/21 0541 07/11/21 0739  HGB 15.2  --  15.6 16.0  HCT 45.8  --  47.1 47.8  PLT 175  --  175 210  LABPROT 30.4*  --  17.8* 15.4*  INR 2.9*  --  1.5* 1.2  CREATININE 0.97  --  0.75 0.84  CKTOTAL 550* 455* 288  --      Estimated Creatinine Clearance: 79.1 mL/min (by C-G formula based on SCr of 0.84 mg/dL).   Medical History: Past Medical History:  Diagnosis Date   Cerebral arterial thrombosis 08/09/2011   Cerebrovascular accident, old 10/27/2015   Diabetes mellitus without complication (HCC)    Metformin   Essential (primary) hypertension 09/25/2014   HLD (hyperlipidemia) 10/27/2015   Hypertension    Nephrolithiasis    Pure hypercholesterolemia 09/25/2014   Type 2 diabetes mellitus (HCC) 09/25/2014    Assessment: 87 year old male on warfarin for h/o cerebral arterial thrombosis. Home dose of warfarin 5 mg daily. Patient presented after having been found down at his home; he was briefly intubated. Pharmacy consulted to restart warfarin.  7/26-27: INR on arrival in goal, but clarithro x1 & unasyn started 7/26 INR inc'd to 3.3 following day requing hold x2d (7/27-28). 7/29: resumed dosing @1mg   LFTs: Mild transaminitis on admission. Albumin 3. Tbili 1.8 DDI: Unasyn  Date INR Plan  7/25 2.2 Last dose unk.  7/26 2.4 Warfarin 2.5 mg  7/27 3.3 Hold  7/28 3.2 Hold  7/29 2.9 1 mg  7/30 1.5 5 mg  7/31 1.2 6 mg    Goal of Therapy:  INR 2-3   Plan:  INR 1.5>1.2 (delta -0.3) dosed with 5mg  x1 last night, but still seeing residual effect of the 1mg  dose and 2days of holding in the trend. Will increase dose to 6mg  to treat like reload. Will dose 6mg  x1 since DDI persist ISO Unasyn (until 8/02) and expect  them to remain sensitive, will dose more cautiously with subsequent doses. --Daily INR per protocol --CBC at least every 3 days per protocol  07/11/2021,9:41 AM

## 2021-07-11 NOTE — Consult Note (Signed)
PHARMACY CONSULT NOTE  Pharmacy Consult for Electrolyte Monitoring and Replacement   Recent Labs: Potassium (mmol/L)  Date Value  07/11/2021 3.5  10/28/2014 4.0   Magnesium (mg/dL)  Date Value  85/01/7740 2.0  10/26/2014 2.1   Calcium (mg/dL)  Date Value  28/78/6767 9.2   Calcium, Total (mg/dL)  Date Value  20/94/7096 8.6   Albumin (g/dL)  Date Value  28/36/6294 3.0 (L)  10/24/2014 3.7   Phosphorus (mg/dL)  Date Value  76/54/6503 2.9   Sodium (mmol/L)  Date Value  07/11/2021 145  10/28/2014 144   Assessment: Patient is an 87 y/o M with medical history including history of CVA, diabetes, HLD, HTN, history of cerebral arterial thrombosis who was admitted after being found down, unresponsive after wellness check. Patient was intubated briefly from 7/25 - 7/26. Pharmacy consulted to assist with electrolyte monitoring and replacement as indicated.   Goal of Therapy:  Electrolytes within normal limits  Plan:  Na resolved now WNL (148 >150 >152>150>148>145).  MIVF D5W continues @ 110 mL/hr. [I/O +8.4L] Defer management to primary team M,Mg,Phos WNL slightly downtrended but potentially dilutional. No repletion today. All other lytes remain WNL.  --Follow-up electrolytes with AM labs tomorrow  Martyn Malay, PharmD, [facility] 07/11/2021 9:43 AM

## 2021-07-11 NOTE — Progress Notes (Signed)
PROGRESS NOTE    Gabriel [name].  DHW:[id number] DOB: [birth year] DOA: 07/05/2021 PCP: Valera Castle, MD    Assessment & Plan:   Active Problems:   Acute respiratory failure (HCC)   Elevated CK   On mechanically assisted ventilation (HCC)   Uncontrolled type 2 diabetes mellitus ([place])   Acute metabolic encephalopathy   Acute kidney injury ([place])   Pressure injury of skin of sacral region   Sepsis: met criteria w/ leukocytosis, fever, tachycardia, elevated lactic acid & aspiration pneumonia. Blood cxs NGTD. Urine cx no growth. Continue on IV unasyn. Resolved   Aspiration pneumonia: continue on IV unasyn x 7 days, bronchodilators & encourage incentive spirometry  Hypernatremia: resolved   Hypophosphatemia: WNL today    AKI: resolved   Rhabdomyolysis: resolved    Acute metabolic encephalopathy: likely secondary to all above. Hx of CVA, cerebral arterial thrombosis, on warfarin. Pharmacy to monitor & dose warfarin   Elevated troponin: now with ST Elevation 07/27. Echo 07/26 EF 65 to 70% with moderate asymmetric left ventricular hypertrophy of the septal segment; mildly increased right ventricular wall thickness, RBBB, bi-atrial enlargement & borderline LAFB noted on 12 lead. Favor demand ischemia as per ICU    DM2: HbA1c 8.8, uncontrolled. Continue on glargine, SSI w/ accuchecks   Dysphagia: continue on dysphagia I diet as per speech  HLD: restarted statin today   HTN: continue on BB  Sacral Pressure injury & scattered abrasions: present on admission. Unknown stage. Wound care consulted    DVT prophylaxis: warfarin  Code Status: full  Family Communication:  Disposition Plan: therapy recs SNF  Level of care: Progressive Cardiac  Status is: Inpatient  Remains inpatient appropriate because:Ongoing diagnostic testing needed not appropriate for outpatient work up, Unsafe d/c plan, IV treatments appropriate due to intensity of illness or inability to take PO, and  Inpatient level of care appropriate due to severity of illness  Dispo: The patient is from: Home              Anticipated d/c is to: SNF               Patient currently is not medically stable to d/c.   Difficult to place patient : unclear   Consultants:  Palliative care ICU  Procedures:   Antimicrobials:   Subjective: Pt c/o malaise   Objective: Vitals:   07/10/21 2010 07/11/21 0531 07/11/21 0758 07/11/21 1130  BP: 111/83 (!) 149/62 (!) 142/70 (!) 114/56  Pulse: 60 (!) 52 (!) 54 (!) 53  Resp: _0 Temp: 97.6 F (36.4 C) 98.3 F (36.8 C) 98 F (36.7 C) 98.7 F (37.1 C)  TempSrc:      SpO2: 93% 92% 92% 92%  Weight:      Height:        Intake/Output Summary (Last 24 hours) at 07/11/2021 1213 Last data filed at 07/11/2021 0940 Gross per 24 hour  Intake 5060.9 ml  Output --  Net 5060.9 ml   Filed Weights   07/07/21 0500 07/08/21 0500 07/10/21 0500  Weight: 100 kg 101 kg 102.4 kg    Examination:  General exam: Appears calm & comfortable  Respiratory system: decreased breath sounds b/l Cardiovascular system: S1/S2+. No rubs or clicks  Gastrointestinal system: Abd is soft, NT, ND & hypoactive bowel sounds  Central nervous system: Alert and awake. Moves all extremities  Psychiatry: Judgement and insight appear abnormal. Flat mood and affect    Data Reviewed: I have personally reviewed  following labs and imaging studies  CBC: Recent Labs  Lab 07/05/21 2033 07/05/21 2241 07/06/21 0421 07/07/21 0149 07/09/21 0308 07/10/21 0541 07/11/21 0739  WBC 13.7*   < > 11.8* 9.9 10.4 10.8* 13.2*  NEUTROABS 11.1*  --   --  8.0*  --   --   --   HGB 17.6*   < > 16.3 16.7 15.2 15.6 16.0  HCT 52.0   < > 49.2 48.9 45.8 47.1 47.8  MCV 101.8*   < > 103.6* 100.8* 104.1* 104.0* 100.2*  PLT 278   < > 194 176 175 175 210   < > = values in this interval not displayed.   Basic Metabolic Panel: Recent Labs  Lab 07/07/21 0149 07/08/21 0118 07/09/21 0308  07/09/21 1450 07/10/21 0541 07/10/21 1703 07/11/21 0739  NA 148* 150* 152* 152* 150* 148* 145  K 3.7 3.8 3.6  --  3.8  --  3.5  CL 118* 118* 117*  --  113*  --  106  CO2 _0 --  32  --  30  GLUCOSE 157* 256* 248*  --  252*  --  224*  BUN 80* 54* 42*  --  32*  --  25*  CREATININE 1.41* 1.03 0.97  --  0.75  --  0.84  CALCIUM 8.9 9.3 9.4  --  9.7  --  9.2  MG 2.4 2.4 2.4  --  2.3  --  2.0  PHOS 2.7 2.0* 2.4*  --  3.3  --  2.9   GFR: Estimated Creatinine Clearance: 79.1 mL/min (by C-G formula based on SCr of 0.84 mg/dL). Liver Function Tests: Recent Labs  Lab 07/05/21 2033  AST 78*  ALT 43  ALKPHOS 46  BILITOT 1.8*  PROT 6.6  ALBUMIN 3.0*   No results for input(s): LIPASE, AMYLASE in the last 168 hours. No results for input(s): AMMONIA in the last 168 hours. Coagulation Profile: Recent Labs  Lab 07/07/21 0149 07/08/21 0118 07/09/21 0308 07/10/21 0541 07/11/21 0739  INR 3.3* 3.2* 2.9* 1.5* 1.2   Cardiac Enzymes: Recent Labs  Lab 07/08/21 0920 07/08/21 1849 07/09/21 0308 07/09/21 0923 07/10/21 0541  CKTOTAL 786* 656* 550* 455* 288   BNP (last 3 results) No results for input(s): PROBNP in the last 8760 hours. HbA1C: No results for input(s): HGBA1C in the last 72 hours.  CBG: Recent Labs  Lab 07/10/21 1141 07/10/21 1611 07/10/21 2217 07/11/21 0758 07/11/21 1130  GLUCAP 286* 208* 163* 252* 266*   Lipid Profile: No results for input(s): CHOL, HDL, LDLCALC, TRIG, CHOLHDL, LDLDIRECT in the last 72 hours. Thyroid Function Tests: No results for input(s): TSH, T4TOTAL, FREET4, T3FREE, THYROIDAB in the last 72 hours.  Anemia Panel: No results for input(s): VITAMINB12, FOLATE, FERRITIN, TIBC, IRON, RETICCTPCT in the last 72 hours. Sepsis Labs: Recent Labs  Lab 07/05/21 2033 07/05/21 2241 07/06/21 0142 07/06/21 0421  LATICACIDVEN 2.8* 3.6* 2.3* 2.4*    Recent Results (from the past 240 hour(s))  Resp Panel by RT-PCR (Flu A&B, Covid)  Nasopharyngeal Swab     Status: None   Collection Time: 07/05/21  8:22 PM   Specimen: Nasopharyngeal Swab; Nasopharyngeal(NP) swabs in vial transport medium  Result Value Ref Range Status   SARS Coronavirus 2 by RT PCR NEGATIVE NEGATIVE Final    Comment: (NOTE) SARS-CoV-2 target nucleic acids are NOT DETECTED.  The SARS-CoV-2 RNA is generally detectable in upper respiratory specimens during the acute phase of infection. The lowest concentration of  SARS-CoV-2 viral copies this assay can detect is 138 copies/mL. A negative result does not preclude SARS-Cov-2 infection and should not be used as the sole basis for treatment or other patient management decisions. A negative result may occur with  improper specimen collection/handling, submission of specimen other than nasopharyngeal swab, presence of viral mutation(s) within the areas targeted by this assay, and inadequate number of viral copies(<138 copies/mL). A negative result must be combined with clinical observations, patient history, and epidemiological information. The expected result is Negative.  Fact Sheet for Patients:  EntrepreneurPulse.com.au  Fact Sheet for Healthcare Providers:  IncredibleEmployment.be  This test is no t yet approved or cleared by the [place] FDA and  has been authorized for detection and/or diagnosis of SARS-CoV-2 by FDA under an Emergency Use Authorization (EUA). This EUA will remain  in effect (meaning this test can be used) for the duration of the COVID-19 declaration under Section 564(b)(1) of the Act, 21 U.S.C.section 360bbb-3(b)(1), unless the authorization is terminated  or revoked sooner.       Influenza A by PCR NEGATIVE NEGATIVE Final   Influenza B by PCR NEGATIVE NEGATIVE Final    Comment: (NOTE) The Xpert Xpress SARS-CoV-2/FLU/RSV plus assay is intended as an aid in the diagnosis of influenza from Nasopharyngeal swab specimens and should not be  used as a sole basis for treatment. Nasal washings and aspirates are unacceptable for Xpert Xpress SARS-CoV-2/FLU/RSV testing.  Fact Sheet for Patients: EntrepreneurPulse.com.au  Fact Sheet for Healthcare Providers: IncredibleEmployment.be  This test is not yet approved or cleared by the [place] FDA and has been authorized for detection and/or diagnosis of SARS-CoV-2 by FDA under an Emergency Use Authorization (EUA). This EUA will remain in effect (meaning this test can be used) for the duration of the COVID-19 declaration under Section 564(b)(1) of the Act, 21 U.S.C. section 360bbb-3(b)(1), unless the authorization is terminated or revoked.  Performed at [facility], [address] [place], [place] [address]   Blood Culture (routine x 2)     Status: None (Preliminary result)   Collection Time: 07/05/21  8:33 PM   Specimen: BLOOD  Result Value Ref Range Status   Specimen Description BLOOD RIGHT ANTECUBITAL  Final   Special Requests   Final    BOTTLES DRAWN AEROBIC AND ANAEROBIC Blood Culture results may not be optimal due to an inadequate volume of blood received in culture bottles   Culture   Final    NO GROWTH 4 DAYS Performed at [facility], [address] [place], [place] [address]    Report Status PENDING  Incomplete  Urine Culture     Status: None   Collection Time: 07/05/21  8:33 PM   Specimen: Urine, Random  Result Value Ref Range Status   Specimen Description   Final    URINE, RANDOM Performed at [facility], [address] [place], [place] [address]    Special Requests   Final    NONE Performed at [facility], [address] [place], [place] [address]    Culture   Final    NO GROWTH Performed at [facility], [place] [address], [place], [place] [address]    Report Status 07/07/2021 FINAL  Final  Blood Culture (routine x 2)     Status: None (Preliminary result)    Collection Time: 07/05/21  8:38 PM   Specimen: BLOOD  Result Value Ref Range Status   Specimen Description BLOOD BLOOD RIGHT FOREARM  Final  Special Requests   Final    BOTTLES DRAWN AEROBIC AND ANAEROBIC Blood Culture adequate volume   Culture   Final    NO GROWTH 4 DAYS Performed at [facility], [address] [place], [place] [address]    Report Status PENDING  Incomplete  MRSA Next Gen by PCR, Nasal     Status: None   Collection Time: 07/06/21 12:34 AM   Specimen: Nasal Mucosa; Nasal Swab  Result Value Ref Range Status   MRSA by PCR Next Gen NOT DETECTED NOT DETECTED Final    Comment: (NOTE) The GeneXpert MRSA Assay (FDA approved for NASAL specimens only), is one component of a comprehensive MRSA colonization surveillance program. It is not intended to diagnose MRSA infection nor to guide or monitor treatment for MRSA infections. Test performance is not FDA approved in patients less than 74 years old. Performed at [facility], [address] [place], [place] [address]   MRSA Next Gen by PCR, Nasal     Status: None   Collection Time: 07/06/21 10:00 AM   Specimen: Nasal Mucosa; Nasal Swab  Result Value Ref Range Status   MRSA by PCR Next Gen NOT DETECTED NOT DETECTED Final    Comment: (NOTE) The GeneXpert MRSA Assay (FDA approved for NASAL specimens only), is one component of a comprehensive MRSA colonization surveillance program. It is not intended to diagnose MRSA infection nor to guide or monitor treatment for MRSA infections. Test performance is not FDA approved in patients less than 56 years old. Performed at [facility], [address] [place], [place] [address]          Radiology Studies: No results found.      Scheduled Meds:  vitamin C  500 mg Oral Daily   aspirin EC  81 mg Oral Daily   atorvastatin  40 mg Oral QHS   Chlorhexidine Gluconate Cloth  6 each Topical Daily   collagenase  1 application Topical Daily    docusate sodium  100 mg Oral BID   feeding supplement (NEPRO CARB STEADY)  237 mL Oral BID BM   Gerhardt's butt cream   Topical BID   insulin aspart  0-20 Units Subcutaneous TID WC   insulin aspart  0-5 Units Subcutaneous QHS   insulin aspart  2 Units Subcutaneous TID WC   insulin glargine-yfgn  12 Units Subcutaneous Daily   metoprolol succinate  25 mg Oral BID   multivitamin with minerals  1 tablet Oral Daily   pantoprazole  40 mg Oral QHS   tamsulosin  0.4 mg Oral Daily   vitamin E  400 Units Oral Daily   warfarin  6 mg Oral ONCE-1600   Warfarin - Pharmacist Dosing Inpatient   Does not apply q1600   Continuous Infusions:  ampicillin-sulbactam (UNASYN) IV 3 g (07/11/21 0840)     LOS: 6 days    Time spent: 30 mins     Wyvonnia Dusky, MD [facility] Pager [phone]  If 7PM-7AM, please contact night-coverage 07/11/2021, 12:13 PM

## 2021-07-12 ENCOUNTER — Inpatient Hospital Stay: Payer: No Typology Code available for payment source

## 2021-07-12 DIAGNOSIS — J90 Pleural effusion, not elsewhere classified: Secondary | ICD-10-CM | POA: Diagnosis not present

## 2021-07-12 DIAGNOSIS — J690 Pneumonitis due to inhalation of food and vomit: Secondary | ICD-10-CM | POA: Diagnosis not present

## 2021-07-12 DIAGNOSIS — G9341 Metabolic encephalopathy: Secondary | ICD-10-CM | POA: Diagnosis not present

## 2021-07-12 LAB — CBC WITH DIFFERENTIAL/PLATELET
Abs Immature Granulocytes: 0.33 10*3/uL — ABNORMAL HIGH (ref 0.00–0.07)
Basophils Absolute: 0.1 10*3/uL (ref 0.0–0.1)
Basophils Relative: 0 %
Eosinophils Absolute: 0.1 10*3/uL (ref 0.0–0.5)
Eosinophils Relative: 1 %
HCT: 45.9 % (ref 39.0–52.0)
Hemoglobin: 15.2 g/dL (ref 13.0–17.0)
Immature Granulocytes: 2 %
Lymphocytes Relative: 5 %
Lymphs Abs: 0.7 10*3/uL (ref 0.7–4.0)
MCH: 33.6 pg (ref 26.0–34.0)
MCHC: 33.1 g/dL (ref 30.0–36.0)
MCV: 101.3 fL — ABNORMAL HIGH (ref 80.0–100.0)
Monocytes Absolute: 0.9 10*3/uL (ref 0.1–1.0)
Monocytes Relative: 6 %
Neutro Abs: 12.9 10*3/uL — ABNORMAL HIGH (ref 1.7–7.7)
Neutrophils Relative %: 86 %
Platelets: 213 10*3/uL (ref 150–400)
RBC: 4.53 MIL/uL (ref 4.22–5.81)
RDW: 12.8 % (ref 11.5–15.5)
WBC: 15 10*3/uL — ABNORMAL HIGH (ref 4.0–10.5)
nRBC: 0 % (ref 0.0–0.2)

## 2021-07-12 LAB — BASIC METABOLIC PANEL
Anion gap: 6 (ref 5–15)
BUN: 27 mg/dL — ABNORMAL HIGH (ref 8–23)
CO2: 34 mmol/L — ABNORMAL HIGH (ref 22–32)
Calcium: 9 mg/dL (ref 8.9–10.3)
Chloride: 107 mmol/L (ref 98–111)
Creatinine, Ser: 0.87 mg/dL (ref 0.61–1.24)
GFR, Estimated: >60 mL/min (ref >60)
Glucose, Bld: 201 mg/dL — ABNORMAL HIGH (ref 70–99)
Potassium: 3.4 mmol/L — ABNORMAL LOW (ref 3.5–5.1)
Sodium: 147 mmol/L — ABNORMAL HIGH (ref 135–145)

## 2021-07-12 LAB — GLUCOSE, CAPILLARY
Glucose-Capillary: 129 mg/dL — ABNORMAL HIGH (ref 70–99)
Glucose-Capillary: 181 mg/dL — ABNORMAL HIGH (ref 70–99)
Glucose-Capillary: 180 mg/dL — ABNORMAL HIGH (ref 70–99)
Glucose-Capillary: 217 mg/dL — ABNORMAL HIGH (ref 70–99)

## 2021-07-12 LAB — PROTIME-INR
INR: 1.6 — ABNORMAL HIGH (ref 0.8–1.2)
INR: 1.7 — ABNORMAL HIGH (ref 0.8–1.2)
Prothrombin Time: 19.1 seconds — ABNORMAL HIGH (ref 11.4–15.2)
Prothrombin Time: 19.5 seconds — ABNORMAL HIGH (ref 11.4–15.2)

## 2021-07-12 LAB — PHOSPHORUS: Phosphorus: 3 mg/dL (ref 2.5–4.6)

## 2021-07-12 LAB — BRAIN NATRIURETIC PEPTIDE: B Natriuretic Peptide: 175 pg/mL — ABNORMAL HIGH (ref 0.0–100.0)

## 2021-07-12 LAB — MAGNESIUM: Magnesium: 2.2 mg/dL (ref 1.7–2.4)

## 2021-07-12 MED ORDER — ACETAMINOPHEN 325 MG PO TABS
650.0000 mg | ORAL_TABLET | Freq: Four times a day (QID) | ORAL | Status: DC | PRN
  Administered 2021-07-12 – 2021-07-15 (×8): 650 mg via ORAL
  Filled 2021-07-12 (×8): qty 2

## 2021-07-12 MED ORDER — FUROSEMIDE 10 MG/ML IJ SOLN
40.0000 mg | Freq: Two times a day (BID) | INTRAMUSCULAR | Status: DC
  Administered 2021-07-12 – 2021-07-14 (×5): 40 mg via INTRAVENOUS
  Filled 2021-07-12 (×5): qty 4

## 2021-07-12 MED ORDER — POTASSIUM CHLORIDE CRYS ER 20 MEQ PO TBCR
30.0000 meq | EXTENDED_RELEASE_TABLET | Freq: Once | ORAL | Status: AC
  Administered 2021-07-12: 30 meq via ORAL
  Filled 2021-07-12: qty 1

## 2021-07-12 MED ORDER — OXYCODONE-ACETAMINOPHEN 5-325 MG PO TABS
1.0000 | ORAL_TABLET | Freq: Four times a day (QID) | ORAL | Status: DC | PRN
  Administered 2021-07-13: 1 via ORAL
  Filled 2021-07-12 (×2): qty 1

## 2021-07-12 MED ORDER — WARFARIN SODIUM 2.5 MG PO TABS
2.5000 mg | ORAL_TABLET | Freq: Once | ORAL | Status: AC
  Administered 2021-07-12: 2.5 mg via ORAL
  Filled 2021-07-12: qty 1

## 2021-07-12 MED ORDER — MORPHINE SULFATE (PF) 2 MG/ML IV SOLN: 1.0000 mg | INTRAVENOUS | Status: DC | PRN

## 2021-07-12 NOTE — Progress Notes (Signed)
Speech Language Pathology Treatment: Dysphagia  Patient Details Name: Gabriel [name]. MRN: [id number] DOB: 05-26-1932 Today's Date: 07/12/2021 Time: 8563-1497 SLP Time Calculation (min) (ACUTE ONLY): 40 min  Assessment / Plan / Recommendation Clinical Impression  Pt seen today for ongoing assessment of toleration of current dysphagia diet, w/ thin liquids; trials to upgrade foods in diet as well as education on general aspiration precautions. Pt appears to present w/ increased attention to, and improvement w/, swallowing of thin liquids. His diet was upgraded on Saturday to include thin liquids w/ the puree foods. Pt is Edentulous and typically wears his Dentures but stated he was eager to try foods that would be "easy" to chew.   Spoke w/ pt's Brother via phone who will attempt to bring in pt's Dentures tomorrow for any further upgrade of diet if possible.   Pt required min-mod visual/verbal cues for follow through w/ po tasks, self-feeding, and following aspiration precautions during po trials. Pt also required full support w/ sitting Upright for po intake; support w/ feeding as well d/t UE weakness bilat.   Pt consumed trials of thin liquids, purees, and softened solids w/ No immediate, overt clinical s/s of aspiration noted; no decline in phonations, no cough, and no decline in respiratory status during/post trials. O2 sats remained upper 90s. Oral phase was grossly [facility] for bolus management and oral clearing of the boluses given. He stated he was able to gum/mash the small pieces that were moistened well. Pt exibited adequate coordination of oropharyngeal swallowing w/ the thin liquids; improved from initial evaluation. Discussed the importance of general aspiration precautions including NO talking when eating/drinking and SMALL bites/sips SLOWLY.     Recommend a Dysphagia level 2(minced foods) w/ Thin liquids; general aspiration precautions; reduce Distractions during meals. Pills Crushed in  Puree for safer swallowing. Support w/ feeding at meals -- check for oral clearing during/post intake. MD/NSG updated. ST services will f/u w/ pt's status, toleration of diet, fit/wear of Dentures b/f trials to upgrade diet further, education on precautions. Brother agreed w/ POC also. NSG/MD updated.      HPI HPI: 87 yo male presenting to [facility] ED via EMS after being found on the ground of his home unresponsive by police during a wellness check.  Per ED documentation and police report they were called to the patient's house for a welfare check possibly by a neighbor, and that no one had seen him for roughly 4 days.  When police arrived they found the patient down on the ground in the kitchen covered in feces and insects. Patient intubated emergently and placed on mechanical ventilation due to inability to protect airway in unresponsive state.  Sepsis protocol initiated and patient received 3.5 L of IV fluid resuscitation and empiric antibiotic coverage.  CXR reveals LLL atelectasis versus developing infiltrate.  Pt was extubated on 07/06/2021.      SLP Plan  Continue with current plan of care (mbs tbd)       Recommendations  Diet recommendations: Dysphagia 2 (fine chop);Thin liquid Liquids provided via: Cup;Straw (monitor) Medication Administration: Crushed with puree (for safer swallowing) Supervision: Patient able to self feed;Staff to assist with self feeding;Full supervision/cueing for compensatory strategies Compensations: Minimize environmental distractions;Slow rate;Small sips/bites;Lingual sweep for clearance of pocketing;Multiple dry swallows after each bite/sip;Follow solids with liquid Postural Changes and/or Swallow Maneuvers: Out of bed for meals;Seated upright 90 degrees;Upright 30-60 min after meal                General  recommendations:  (Dietician f/u for support) Oral Care Recommendations: Oral care BID;Oral care before and after PO;Staff/trained caregiver to provide oral  care Follow up Recommendations: Skilled Nursing facility (TBD) SLP Visit Diagnosis: Dysphagia, oropharyngeal phase (R13.12) (edentulous) Plan: Continue with current plan of care (mbs tbd)       GO                  Jerilynn Som, MS, CCC-SLP Speech Language Pathologist Rehab Services [phone] [facility] 07/12/2021, 3:39 PM

## 2021-07-12 NOTE — TOC Progression Note (Signed)
Transition of Care (TOC) - Progression Note    Patient Details  Name: Gabriel [name]. MRN: [id number] Date of Birth: [birth year]  Transition of Care [facility]) CM/SW Contact  Gildardo Griffes, [place] Phone Number: 07/12/2021, 2:38 PM  Clinical Narrative:     Patient has no bed offers at this time, CSW lvm with [facility] to inquire if they can review the referrals that were sent 3 days ago, patient continues to pend bed offers at this time.   Expected Discharge Plan: Skilled Nursing Facility Barriers to Discharge: Continued Medical Work up  Expected Discharge Plan and Services Expected Discharge Plan: Skilled Nursing Facility     Post Acute Care Choice: Skilled Nursing Facility Living arrangements for the past 2 months: Single Family Home                                       Social Determinants of Health (SDOH) Interventions    Readmission Risk Interventions No flowsheet data found.

## 2021-07-12 NOTE — Progress Notes (Signed)
PROGRESS NOTE    Gabriel [name].  YBF:[id number] DOB: 1932-01-10 DOA: 07/05/2021 PCP: Valera Castle, MD    Assessment & Plan:   Active Problems:   Acute respiratory failure (HCC)   Elevated CK   On mechanically assisted ventilation (HCC)   Uncontrolled type 2 diabetes mellitus ([place])   Acute metabolic encephalopathy   Acute kidney injury ([place])   Pressure injury of skin of sacral region   Sepsis: met criteria w/ leukocytosis, fever, tachycardia, elevated lactic acid & aspiration pneumonia. Blood cxs NGTD. Urine cx no growth. Continue on IV unasyn. Resolved   Aspiration pneumonia: continue on IV unasyn x 7 days, bronchodilators & encourage incentive spirometry.  B/l pleural effusions: R>L as per CXR. Started on IV lasix.   Hypernatremia: resolved   Hypophosphatemia: WNL today    AKI: resolved   Rhabdomyolysis: resolved    Acute metabolic encephalopathy: mental status is labile. Likely secondary to all above. Hx of CVA, cerebral arterial thrombosis, on warfarin. Pharmacy to monitor & dose warfarin   Elevated troponin: now with ST Elevation 07/27. Echo 07/26 EF 65 to 70% with moderate asymmetric left ventricular hypertrophy of the septal segment; mildly increased right ventricular wall thickness, RBBB, bi-atrial enlargement & borderline LAFB noted on 12 lead. Favor demand ischemia as per ICU    DM2: uncontrolled, HbA1c 8.8. Continue on glargine, SSI w/ accuchecks   Dysphagia: continue on dysphagia I diet as per speech  HLD: continue on statin   HTN: continue on metoprolol   Sacral Pressure injury & scattered abrasions: present on admission. Unknown stage. Continue w/ wound care   DVT prophylaxis: warfarin  Code Status: full  Family Communication:  Disposition Plan: therapy recs SNF  Level of care: Progressive Cardiac  Status is: Inpatient  Remains inpatient appropriate because:Ongoing diagnostic testing needed not appropriate for outpatient work up,  Unsafe d/c plan, IV treatments appropriate due to intensity of illness or inability to take PO, and Inpatient level of care appropriate due to severity of illness  Dispo: The patient is from: Home              Anticipated d/c is to: SNF               Patient currently is not medically stable to d/c.   Difficult to place patient : unclear   Consultants:  Palliative care ICU  Procedures:   Antimicrobials:   Subjective: Pt c/o shortness of breath   Objective: Vitals:   07/11/21 1130 07/11/21 1512 07/11/21 2010 07/12/21 0345  BP: (!) 114/56 112/64 125/68 119/70  Pulse: (!) 53 (!) 101 (!) 54 93  Resp: 17 15 18 18   Temp: 98.7 F (37.1 C) 98.5 F (36.9 C) 98.6 F (37 C) 98.7 F (37.1 C)  TempSrc:   Oral Oral  SpO2: 92% 92% 92% 92%  Weight:      Height:        Intake/Output Summary (Last 24 hours) at 07/12/2021 0755 Last data filed at 07/12/2021 0500 Gross per 24 hour  Intake 710.82 ml  Output 400 ml  Net 310.82 ml   Filed Weights   07/07/21 0500 07/08/21 0500 07/10/21 0500  Weight: 100 kg 101 kg 102.4 kg    Examination:  General exam: Appears uncomfortable   Respiratory system: diminished breath sounds b/l R>L  Cardiovascular system: S1 & S2+. No rubs or clicks  Gastrointestinal system: Abd is soft, NT,obese & normal bowel sounds   Central nervous system: Alert and oriented. Moves  all extremities  Psychiatry: Judgement and insight appear abnormal. Flat mood and affect    Data Reviewed: I have personally reviewed following labs and imaging studies  CBC: Recent Labs  Lab 07/05/21 2033 07/05/21 2241 07/06/21 0421 07/07/21 0149 07/09/21 0308 07/10/21 0541 07/11/21 0739  WBC 13.7*   < > 11.8* 9.9 10.4 10.8* 13.2*  NEUTROABS 11.1*  --   --  8.0*  --   --   --   HGB 17.6*   < > 16.3 16.7 15.2 15.6 16.0  HCT 52.0   < > 49.2 48.9 45.8 47.1 47.8  MCV 101.8*   < > 103.6* 100.8* 104.1* 104.0* 100.2*  PLT 278   < > 194 176 175 175 210   < > = values in this  interval not displayed.   Basic Metabolic Panel: Recent Labs  Lab 07/07/21 0149 07/08/21 0118 07/09/21 0308 07/09/21 1450 07/10/21 0541 07/10/21 1703 07/11/21 0739  NA 148* 150* 152* 152* 150* 148* 145  K 3.7 3.8 3.6  --  3.8  --  3.5  CL 118* 118* 117*  --  113*  --  106  CO2 25 29 31   --  32  --  30  GLUCOSE 157* 256* 248*  --  252*  --  224*  BUN 80* 54* 42*  --  32*  --  25*  CREATININE 1.41* 1.03 0.97  --  0.75  --  0.84  CALCIUM 8.9 9.3 9.4  --  9.7  --  9.2  MG 2.4 2.4 2.4  --  2.3  --  2.0  PHOS 2.7 2.0* 2.4*  --  3.3  --  2.9   GFR: Estimated Creatinine Clearance: 79.1 mL/min (by C-G formula based on SCr of 0.84 mg/dL). Liver Function Tests: Recent Labs  Lab 07/05/21 2033  AST 78*  ALT 43  ALKPHOS 46  BILITOT 1.8*  PROT 6.6  ALBUMIN 3.0*   No results for input(s): LIPASE, AMYLASE in the last 168 hours. No results for input(s): AMMONIA in the last 168 hours. Coagulation Profile: Recent Labs  Lab 07/07/21 0149 07/08/21 0118 07/09/21 0308 07/10/21 0541 07/11/21 0739  INR 3.3* 3.2* 2.9* 1.5* 1.2   Cardiac Enzymes: Recent Labs  Lab 07/08/21 0920 07/08/21 1849 07/09/21 0308 07/09/21 0923 07/10/21 0541  CKTOTAL 786* 656* 550* 455* 288   BNP (last 3 results) No results for input(s): PROBNP in the last 8760 hours. HbA1C: No results for input(s): HGBA1C in the last 72 hours.  CBG: Recent Labs  Lab 07/10/21 2217 07/11/21 0758 07/11/21 1130 07/11/21 1619 07/11/21 2014  GLUCAP 163* 252* 266* 182* 136*   Lipid Profile: No results for input(s): CHOL, HDL, LDLCALC, TRIG, CHOLHDL, LDLDIRECT in the last 72 hours. Thyroid Function Tests: No results for input(s): TSH, T4TOTAL, FREET4, T3FREE, THYROIDAB in the last 72 hours.  Anemia Panel: No results for input(s): VITAMINB12, FOLATE, FERRITIN, TIBC, IRON, RETICCTPCT in the last 72 hours. Sepsis Labs: Recent Labs  Lab 07/05/21 2033 07/05/21 2241 07/06/21 0142 07/06/21 0421  LATICACIDVEN 2.8*  3.6* 2.3* 2.4*    Recent Results (from the past 240 hour(s))  Resp Panel by RT-PCR (Flu A&B, Covid) Nasopharyngeal Swab     Status: None   Collection Time: 07/05/21  8:22 PM   Specimen: Nasopharyngeal Swab; Nasopharyngeal(NP) swabs in vial transport medium  Result Value Ref Range Status   SARS Coronavirus 2 by RT PCR NEGATIVE NEGATIVE Final    Comment: (NOTE) SARS-CoV-2 target nucleic acids are NOT  DETECTED.  The SARS-CoV-2 RNA is generally detectable in upper respiratory specimens during the acute phase of infection. The lowest concentration of SARS-CoV-2 viral copies this assay can detect is 138 copies/mL. A negative result does not preclude SARS-Cov-2 infection and should not be used as the sole basis for treatment or other patient management decisions. A negative result may occur with  improper specimen collection/handling, submission of specimen other than nasopharyngeal swab, presence of viral mutation(s) within the areas targeted by this assay, and inadequate number of viral copies(<138 copies/mL). A negative result must be combined with clinical observations, patient history, and epidemiological information. The expected result is Negative.  Fact Sheet for Patients:  EntrepreneurPulse.com.au  Fact Sheet for Healthcare Providers:  IncredibleEmployment.be  This test is no t yet approved or cleared by the [place] FDA and  has been authorized for detection and/or diagnosis of SARS-CoV-2 by FDA under an Emergency Use Authorization (EUA). This EUA will remain  in effect (meaning this test can be used) for the duration of the COVID-19 declaration under Section 564(b)(1) of the Act, 21 U.S.C.section 360bbb-3(b)(1), unless the authorization is terminated  or revoked sooner.       Influenza A by PCR NEGATIVE NEGATIVE Final   Influenza B by PCR NEGATIVE NEGATIVE Final    Comment: (NOTE) The Xpert Xpress SARS-CoV-2/FLU/RSV plus assay is  intended as an aid in the diagnosis of influenza from Nasopharyngeal swab specimens and should not be used as a sole basis for treatment. Nasal washings and aspirates are unacceptable for Xpert Xpress SARS-CoV-2/FLU/RSV testing.  Fact Sheet for Patients: EntrepreneurPulse.com.au  Fact Sheet for Healthcare Providers: IncredibleEmployment.be  This test is not yet approved or cleared by the [place] FDA and has been authorized for detection and/or diagnosis of SARS-CoV-2 by FDA under an Emergency Use Authorization (EUA). This EUA will remain in effect (meaning this test can be used) for the duration of the COVID-19 declaration under Section 564(b)(1) of the Act, 21 U.S.C. section 360bbb-3(b)(1), unless the authorization is terminated or revoked.  Performed at [facility], [address] [place], [place] [address]   Blood Culture (routine x 2)     Status: None (Preliminary result)   Collection Time: 07/05/21  8:33 PM   Specimen: BLOOD  Result Value Ref Range Status   Specimen Description BLOOD RIGHT ANTECUBITAL  Final   Special Requests   Final    BOTTLES DRAWN AEROBIC AND ANAEROBIC Blood Culture results may not be optimal due to an inadequate volume of blood received in culture bottles   Culture   Final    NO GROWTH 4 DAYS Performed at [facility], [address], [place], [place] [address]    Report Status PENDING  Incomplete  Urine Culture     Status: None   Collection Time: 07/05/21  8:33 PM   Specimen: Urine, Random  Result Value Ref Range Status   Specimen Description   Final    URINE, RANDOM Performed at [facility], [address] [place], [place] [address]    Special Requests   Final    NONE Performed at [facility], [address] [place], [place] [address]    Culture   Final    NO GROWTH Performed at [facility], [place] [address], [place], [place] [address]    Report  Status 07/07/2021 FINAL  Final  Blood Culture (routine x 2)     Status: None (Preliminary result)   Collection Time: 07/05/21  8:38 PM   Specimen: BLOOD  Result Value Ref Range Status   Specimen Description BLOOD BLOOD RIGHT FOREARM  Final   Special Requests   Final    BOTTLES DRAWN AEROBIC AND ANAEROBIC Blood Culture adequate volume   Culture   Final    NO GROWTH 4 DAYS Performed at [facility], [address], [place], [place] [address]    Report Status PENDING  Incomplete  MRSA Next Gen by PCR, Nasal     Status: None   Collection Time: 07/06/21 12:34 AM   Specimen: Nasal Mucosa; Nasal Swab  Result Value Ref Range Status   MRSA by PCR Next Gen NOT DETECTED NOT DETECTED Final    Comment: (NOTE) The GeneXpert MRSA Assay (FDA approved for NASAL specimens only), is one component of a comprehensive MRSA colonization surveillance program. It is not intended to diagnose MRSA infection nor to guide or monitor treatment for MRSA infections. Test performance is not FDA approved in patients less than 67 years old. Performed at [facility], [address] [place], [place] [address]   MRSA Next Gen by PCR, Nasal     Status: None   Collection Time: 07/06/21 10:00 AM   Specimen: Nasal Mucosa; Nasal Swab  Result Value Ref Range Status   MRSA by PCR Next Gen NOT DETECTED NOT DETECTED Final    Comment: (NOTE) The GeneXpert MRSA Assay (FDA approved for NASAL specimens only), is one component of a comprehensive MRSA colonization surveillance program. It is not intended to diagnose MRSA infection nor to guide or monitor treatment for MRSA infections. Test performance is not FDA approved in patients less than 51 years old. Performed at [facility], [address] [place], [place] [address]          Radiology Studies: No results found.      Scheduled Meds:  vitamin C  500 mg Oral Daily   aspirin EC  81 mg Oral Daily   atorvastatin  40 mg Oral QHS    Chlorhexidine Gluconate Cloth  6 each Topical Daily   collagenase  1 application Topical Daily   docusate sodium  100 mg Oral BID   feeding supplement (NEPRO CARB STEADY)  237 mL Oral BID BM   Gerhardt's butt cream   Topical BID   insulin aspart  0-20 Units Subcutaneous TID WC   insulin aspart  0-5 Units Subcutaneous QHS   insulin aspart  2 Units Subcutaneous TID WC   insulin glargine-yfgn  12 Units Subcutaneous Daily   metoprolol succinate  25 mg Oral BID   multivitamin with minerals  1 tablet Oral Daily   pantoprazole  40 mg Oral QHS   tamsulosin  0.4 mg Oral Daily   vitamin E  400 Units Oral Daily   Warfarin - Pharmacist Dosing Inpatient   Does not apply q1600   Continuous Infusions:  sodium chloride 10 mL/hr at 07/12/21 0433   ampicillin-sulbactam (UNASYN) IV Stopped (07/12/21 0414)     LOS: 7 days    Time spent: 32 mins     Wyvonnia Dusky, MD [facility] Pager [phone]  If 7PM-7AM, please contact night-coverage 07/12/2021, 7:55 AM

## 2021-07-12 NOTE — Consult Note (Signed)
PHARMACY CONSULT NOTE  Pharmacy Consult for Electrolyte Monitoring and Replacement   Recent Labs: Potassium (mmol/L)  Date Value  07/12/2021 3.4 (L)  10/28/2014 4.0   Magnesium (mg/dL)  Date Value  70/62/3762 2.2  10/26/2014 2.1   Calcium (mg/dL)  Date Value  83/15/1761 9.0   Calcium, Total (mg/dL)  Date Value  60/73/7106 8.6   Albumin (g/dL)  Date Value  26/94/8546 3.0 (L)  10/24/2014 3.7   Phosphorus (mg/dL)  Date Value  27/02/5008 3.0   Sodium (mmol/L)  Date Value  07/12/2021 147 (H)  10/28/2014 144   Assessment: Patient is an 87 y/o M with medical history including history of CVA, diabetes, HLD, HTN, history of cerebral arterial thrombosis who was admitted after being found down, unresponsive after wellness check. Patient was intubated briefly from 7/25 - 7/26. Pharmacy consulted to assist with electrolyte monitoring and replacement as indicated.   Goal of Therapy:  Electrolytes within normal limits  Plan:  Na resolved now WNL (148 >150 >152>150>148>145 > 147).  MIVF D5W stopped 7/31 @ 1209 K 3.8>3.5>3.4 IV lasix 40 mg BID started 8/1 AM Anticipate further reduction following tonight's dose Will replenish with 30 mEq oral K+ M,Mg,Phos WNL slightly downtrended but potentially dilutional. No repletion today. All other lytes remain WNL.  --Follow-up electrolytes with AM labs tomorrow  Sharen Hones, PharmD, BCPS 07/12/2021 6:12 PM

## 2021-07-12 NOTE — Progress Notes (Signed)
Physical Therapy Treatment Patient Details Name: Gabriel [name]. MRN: [id number] DOB: [birth year] Today's Date: 07/12/2021    History of Present Illness 87 yo male presenting to [facility] ED via EMS after being found on the ground of his home unresponsive by police during a wellness check.  Per ED documentation and police report they were called to the patient's house having been called for a welfare check-in, apparently no one had seen him in ~4 days and he was covered in feces and insects when they found him.  Pt intubated for 1 day.    PT Comments    Pt received supine in bed with HOB elevated; speech therapy in room. Pt agreeable to therapy. Per SLP, pt diet had just been updated so ice cream breaks were taken between exercises. Supine therex performed today as PT was unable to find +2 for functional mobility tasks. Overall, pt demo increased strength in LLE compared to RLE. He required AAROM with SLR due to weak hip flexion. He tolerated SAQ and leg press well with light manual resistance. During final exercise, pt requested for PT to be complete due to fatigue. Would benefit from skilled PT to address above deficits and promote optimal return to PLOF.   Follow Up Recommendations  SNF     Equipment Recommendations  None recommended by PT    Recommendations for Other Services       Precautions / Restrictions Precautions Precautions: Fall Restrictions Weight Bearing Restrictions: No    Mobility  Bed Mobility                    Transfers                    Ambulation/Gait                 Stairs             Wheelchair Mobility    Modified Rankin (Stroke Patients Only)       Balance       Sitting balance - Comments: not assessed, pt remained supine for therex as a +2 was not available today                                    Cognition Arousal/Alertness: Awake/alert Behavior During Therapy: WFL for tasks  assessed/performed Overall Cognitive Status: No family/caregiver present to determine baseline cognitive functioning                                 General Comments: pt able to follow commands with verbal and tactile cueing      Exercises General Exercises - Lower Extremity Ankle Circles/Pumps: AAROM;AROM;Both;20 reps;Supine;Strengthening (AAROM on LLE to guide direction of movement) Short Arc Quad: Strengthening;Both;20 reps;Supine;AROM (light manual resistance) Straight Leg Raises: AAROM;Strengthening;Both;10 reps;Supine Shoulder Exercises Shoulder Flexion: AAROM;Strengthening;Both;10 reps;Supine Other Exercises Other Exercises: Above therex plus:     DF stretch: 2 x 30 seconds BLE. Resisted PF: x20 BLE. Leg press: x20 BLE with manual resistance. Horizonal shoulder adduction/pec strengthening with scapular retraction back to starting point: x10 BUE.    General Comments        Pertinent Vitals/Pain Pain Assessment: No/denies pain    Home Living                      Prior  Function            PT Goals (current goals can now be found in the care plan section) Acute Rehab PT Goals Patient Stated Goal: to go home PT Goal Formulation: With patient Time For Goal Achievement: 07/21/21 Potential to Achieve Goals: Poor    Frequency    Min 2X/week      PT Plan      Co-evaluation              AM-PAC PT "6 Clicks" Mobility   Outcome Measure  Help needed turning from your back to your side while in a flat bed without using bedrails?: Total Help needed moving from lying on your back to sitting on the side of a flat bed without using bedrails?: Total Help needed moving to and from a bed to a chair (including a wheelchair)?: Total Help needed standing up from a chair using your arms (e.g., wheelchair or bedside chair)?: Total Help needed to walk in hospital room?: Total Help needed climbing 3-5 steps with a railing? : Total 6 Click Score: 6     End of Session Equipment Utilized During Treatment: Oxygen Activity Tolerance: Patient tolerated treatment well;Patient limited by fatigue Patient left: in bed;with call bell/phone within reach;with bed alarm set Nurse Communication: Mobility status PT Visit Diagnosis: Muscle weakness (generalized) (M62.81);Difficulty in walking, not elsewhere classified (R26.2);History of falling (Z91.81)     Time: 1430-1500 PT Time Calculation (min) (ACUTE ONLY): 30 min  Charges:  $Therapeutic Exercise: 23-37 mins                     Gabriel [name] PT, DPT 07/12/21 5:42 PM [phone]    Gabriel [name] 07/12/2021, 5:21 PM

## 2021-07-12 NOTE — Progress Notes (Signed)
Occupational Therapy Treatment Patient Details Name: Gabriel [name]. MRN: [id number] DOB: 19-Jan-1932 Today's Date: 07/12/2021    History of present illness 87 yo male presenting to [facility] ED via EMS after being found on the ground of his home unresponsive by police during a wellness check.  Per ED documentation and police report they were called to the patient's house having been called for a welfare check-in, apparently no one had seen him in ~4 days and he was covered in feces and insects when they found him.  Pt intubated for 1 day.   OT comments  Pt seen for OT tx with RN for bed level toileting. Pt noted to be soiled. Pt required MAX-TOTAL A +2 for rolling, MOD A-MAX A x1 to maintain sidelying, and DEP for pericare. Pt appeared somewhat confused, required intermittent cues to keep hand away from the soiled area of the bed. Cues for following 1 steps commands. Pt endorsing fatigue from the effort. Pt continues to benefit from skilled OT services. Continue to recommend SNF.   Follow Up Recommendations  SNF    Equipment Recommendations  Other (comment) (defer)    Recommendations for Other Services      Precautions / Restrictions Precautions Precautions: Fall Restrictions Weight Bearing Restrictions: No       Mobility Bed Mobility Overal bed mobility: Needs Assistance Bed Mobility: Rolling Rolling: Max assist;+2 for physical assistance;Total assist         General bed mobility comments: MAX-TOTAL A +2 for rolling for bed level toileting tasks, MOD A to maintain sidelying    Transfers                      Balance                                           ADL either performed or assessed with clinical judgement   ADL Overall ADL's : Needs assistance/impaired             Lower Body Bathing: Total assistance;Bed level             Toilet Transfer Details (indicate cue type and reason): Max A +2 for bed level rolling for bed level  toileting/pericare, MOD A to maintain sidelying Toileting- Clothing Manipulation and Hygiene: Total assistance;+2 for physical assistance;Bed level               Vision Patient Visual Report: No change from baseline     Perception     Praxis      Cognition Arousal/Alertness: Awake/alert Behavior During Therapy: WFL for tasks assessed/performed Overall Cognitive Status: No family/caregiver present to determine baseline cognitive functioning                                 General Comments: pt able to follow 1 step commands with MOD-MAX cues        Exercises     Shoulder Instructions       General Comments      Pertinent Vitals/ Pain       Pain Assessment: Faces Faces Pain Scale: Hurts even more Pain Location: groin area  Home Living  Prior Functioning/Environment              Frequency  Min 1X/week        Progress Toward Goals  OT Goals(current goals can now be found in the care plan section)  Progress towards OT goals: OT to reassess next treatment  Acute Rehab OT Goals Patient Stated Goal: to go home OT Goal Formulation: With patient Time For Goal Achievement: 07/21/21 Potential to Achieve Goals: Fair  Plan Discharge plan remains appropriate;Frequency needs to be updated    Co-evaluation                 AM-PAC OT "6 Clicks" Daily Activity     Outcome Measure   Help from another person eating meals?: A Lot Help from another person taking care of personal grooming?: A Lot Help from another person toileting, which includes using toliet, bedpan, or urinal?: Total Help from another person bathing (including washing, rinsing, drying)?: Total Help from another person to put on and taking off regular upper body clothing?: A Lot Help from another person to put on and taking off regular lower body clothing?: Total 6 Click Score: 9    End of Session    OT Visit  Diagnosis: Other abnormalities of gait and mobility (R26.89);Muscle weakness (generalized) (M62.81);History of falling (Z91.81)   Activity Tolerance Patient tolerated treatment well   Patient Left in bed;with call bell/phone within reach;with bed alarm set;with nursing/sitter in room   Nurse Communication          Time: 3893-7342 OT Time Calculation (min): 17 min  Charges: OT General Charges $OT Visit: 1 Visit OT Treatments $Self Care/Home Management : 8-22 mins  Wynona Canes, MPH, MS, OTR/L ascom [phone] 07/12/21, 1:14 PM

## 2021-07-12 NOTE — Progress Notes (Signed)
ANTICOAGULATION CONSULT NOTE  Pharmacy Consult for warfarin Indication: h/o cerebral arterial thrombosis  Patient Measurements: Height: 6' 2.02" (188 cm) Weight: 102.4 kg (225 lb 12 oz) IBW/kg (Calculated) : 82.24  Labs: Recent Labs    07/10/21 0541 07/11/21 0739 07/12/21 1630  HGB 15.6 16.0 15.2  HCT 47.1 47.8 45.9  PLT 175 210 213  LABPROT 17.8* 15.4* 19.1*  INR 1.5* 1.2 1.6*  CREATININE 0.75 0.84 0.87  CKTOTAL 288  --   --      Estimated Creatinine Clearance: 76.4 mL/min (by C-G formula based on SCr of 0.87 mg/dL).   Medical History: Past Medical History:  Diagnosis Date   Cerebral arterial thrombosis 08/09/2011   Cerebrovascular accident, old 10/27/2015   Diabetes mellitus without complication (HCC)    Metformin   Essential (primary) hypertension 09/25/2014   HLD (hyperlipidemia) 10/27/2015   Hypertension    Nephrolithiasis    Pure hypercholesterolemia 09/25/2014   Type 2 diabetes mellitus (HCC) 09/25/2014    Assessment: 87 year old male on warfarin for h/o cerebral arterial thrombosis. Home dose of warfarin 5 mg daily. Patient presented after having been found down at his home; he was briefly intubated. Pharmacy consulted to restart warfarin.  7/26-27: INR on arrival in goal, but clarithro x1 & unasyn started 7/26 INR inc'd to 3.3 following day requing hold x2d (7/27-28). 7/29: resumed dosing @1mg   LFTs: Mild transaminitis on admission. Albumin 3. Tbili 1.8 DDI: Unasyn  Date INR Plan  7/25 2.2 Last dose unk.  7/26 2.4 Warfarin 2.5 mg  7/27 3.3 Hold  7/28 3.2 Hold  7/29 2.9 1 mg  7/30 1.5 5 mg  7/31 1.2 6 mg   8/01         1.6  3 mg  Goal of Therapy:  INR 2-3   Plan:  INR 1.2>1.6 (delta +0.4)  Will give reduced dose of 2.5 mg  x1 since DDI persist ISO Unasyn (until 8/02) and expect them to remain sensitive, also INR effects of  previous night's 6 mg dose not fully seen yet --Daily INR per protocol --CBC at least every 3 days per  protocol  10/02, PharmD, BCPS Clinical Pharmacist   07/12/2021,6:06 PM

## 2021-07-13 ENCOUNTER — Inpatient Hospital Stay: Payer: Self-pay

## 2021-07-13 DIAGNOSIS — G9341 Metabolic encephalopathy: Secondary | ICD-10-CM | POA: Diagnosis not present

## 2021-07-13 DIAGNOSIS — J90 Pleural effusion, not elsewhere classified: Secondary | ICD-10-CM | POA: Diagnosis not present

## 2021-07-13 DIAGNOSIS — J690 Pneumonitis due to inhalation of food and vomit: Secondary | ICD-10-CM | POA: Diagnosis not present

## 2021-07-13 LAB — CBC
HCT: 48.2 % (ref 39.0–52.0)
Hemoglobin: 16.3 g/dL (ref 13.0–17.0)
MCH: 34.5 pg — ABNORMAL HIGH (ref 26.0–34.0)
MCHC: 33.8 g/dL (ref 30.0–36.0)
MCV: 101.9 fL — ABNORMAL HIGH (ref 80.0–100.0)
Platelets: 205 10*3/uL (ref 150–400)
RBC: 4.73 MIL/uL (ref 4.22–5.81)
RDW: 12.8 % (ref 11.5–15.5)
WBC: 17.5 10*3/uL — ABNORMAL HIGH (ref 4.0–10.5)
nRBC: 0 % (ref 0.0–0.2)

## 2021-07-13 LAB — BASIC METABOLIC PANEL
Anion gap: 7 (ref 5–15)
BUN: 27 mg/dL — ABNORMAL HIGH (ref 8–23)
CO2: 32 mmol/L (ref 22–32)
Calcium: 9.1 mg/dL (ref 8.9–10.3)
Chloride: 106 mmol/L (ref 98–111)
Creatinine, Ser: 0.78 mg/dL (ref 0.61–1.24)
GFR, Estimated: >60 mL/min (ref >60)
Glucose, Bld: 190 mg/dL — ABNORMAL HIGH (ref 70–99)
Potassium: 3.5 mmol/L (ref 3.5–5.1)
Sodium: 145 mmol/L (ref 135–145)

## 2021-07-13 LAB — GLUCOSE, CAPILLARY
Glucose-Capillary: 126 mg/dL — ABNORMAL HIGH (ref 70–99)
Glucose-Capillary: 173 mg/dL — ABNORMAL HIGH (ref 70–99)
Glucose-Capillary: 164 mg/dL — ABNORMAL HIGH (ref 70–99)
Glucose-Capillary: 130 mg/dL — ABNORMAL HIGH (ref 70–99)

## 2021-07-13 LAB — CULTURE, BLOOD (ROUTINE X 2)
Culture: NO GROWTH
Culture: NO GROWTH
Special Requests: ADEQUATE

## 2021-07-13 LAB — PHOSPHORUS: Phosphorus: 3.3 mg/dL (ref 2.5–4.6)

## 2021-07-13 LAB — MAGNESIUM: Magnesium: 2 mg/dL (ref 1.7–2.4)

## 2021-07-13 LAB — PROTIME-INR
INR: 1.8 — ABNORMAL HIGH (ref 0.8–1.2)
Prothrombin Time: 21 seconds — ABNORMAL HIGH (ref 11.4–15.2)

## 2021-07-13 MED ORDER — WARFARIN SODIUM 2.5 MG PO TABS
2.5000 mg | ORAL_TABLET | Freq: Once | ORAL | Status: AC
  Administered 2021-07-13: 2.5 mg via ORAL
  Filled 2021-07-13: qty 1

## 2021-07-13 MED ORDER — SODIUM CHLORIDE 0.9% FLUSH
10.0000 mL | Freq: Two times a day (BID) | INTRAVENOUS | Status: DC
  Administered 2021-07-13 – 2021-07-15 (×4): 10 mL

## 2021-07-13 MED ORDER — SODIUM CHLORIDE 0.9% FLUSH: 10.0000 mL | INTRAVENOUS | Status: DC | PRN

## 2021-07-13 NOTE — Progress Notes (Signed)
Peripherally Inserted Central Catheter Placement  The IV Nurse has discussed with the patient and/or persons authorized to consent for the patient, the purpose of this procedure and the potential benefits and risks involved with this procedure.  The benefits include less needle sticks, lab draws from the catheter, and the patient may be discharged home with the catheter. Risks include, but not limited to, infection, bleeding, blood clot (thrombus formation), and puncture of an artery; nerve damage and irregular heartbeat and possibility to perform a PICC exchange if needed/ordered by physician.  Alternatives to this procedure were also discussed.  Bard Power PICC patient education guide, fact sheet on infection prevention and patient information card has been provided to patient /or left at bedside.    PICC Placement Documentation  PICC Double Lumen 07/13/21 PICC Right Basilic 43 cm 0 cm (Active)  Site Assessment Clean;Dry;Intact 07/13/21 1526  Lumen #1 Status Flushed;Saline locked;Blood return noted 07/13/21 1526  Lumen #2 Status Flushed;Saline locked;Blood return noted 07/13/21 1526  Dressing Type Transparent;Securing device 07/13/21 1526  Dressing Status Clean;Dry;Intact 07/13/21 1526  Antimicrobial disc in place? Yes 07/13/21 1526  Safety Lock Not Applicable 07/13/21 1526  Dressing Change Due 07/20/21 07/13/21 1526       Romie Jumper 07/13/2021, 3:29 PM

## 2021-07-13 NOTE — Progress Notes (Signed)
PT Cancellation Note  Patient Details Name: Gabriel [name]. MRN: [id number] DOB: 1932-04-23   Cancelled Treatment:     Therapist in to see pt this am who refused any activity. "I'm not interested in doing anything today"  Therapist explained benefits of participation, pt again declined.  Will try back at a later date. Continue to recommend SNF once medically stable.   Jannet Askew 07/13/2021, 9:53 AM

## 2021-07-13 NOTE — Progress Notes (Signed)
ANTICOAGULATION CONSULT NOTE  Pharmacy Consult for warfarin Indication: h/o cerebral arterial thrombosis  Patient Measurements: Height: 6' 2.02" (188 cm) Weight: 102.4 kg (225 lb 12 oz) IBW/kg (Calculated) : 82.24  Labs: Recent Labs    07/11/21 0739 07/12/21 1630 07/12/21 1939  HGB 16.0 15.2  --   HCT 47.8 45.9  --   PLT 210 213  --   LABPROT 15.4* 19.1* 19.5*  INR 1.2 1.6* 1.7*  CREATININE 0.84 0.87  --      Estimated Creatinine Clearance: 76.4 mL/min (by C-G formula based on SCr of 0.87 mg/dL).   Medical History: Past Medical History:  Diagnosis Date   Cerebral arterial thrombosis 08/09/2011   Cerebrovascular accident, old 10/27/2015   Diabetes mellitus without complication (HCC)    Metformin   Essential (primary) hypertension 09/25/2014   HLD (hyperlipidemia) 10/27/2015   Hypertension    Nephrolithiasis    Pure hypercholesterolemia 09/25/2014   Type 2 diabetes mellitus (HCC) 09/25/2014    Assessment: 87 year old male on warfarin for h/o cerebral arterial thrombosis. Home dose of warfarin 5 mg daily. Patient presented after having been found down at his home; he was briefly intubated. Pharmacy consulted to restart warfarin.  7/26-27: INR on arrival in goal, but clarithro x1 & unasyn started 7/26 INR inc'd to 3.3 following day requing hold x2d (7/27-28). 7/29: resumed dosing @1mg   LFTs: Mild transaminitis on admission. Albumin 3. Tbili 1.8 DDI: Unasyn  Date INR Plan  7/25 2.2 Last dose unk.  7/26 2.4 Warfarin 2.5 mg  7/27 3.3 Hold  7/28 3.2 Hold  7/29 2.9 1 mg  7/30 1.5 5 mg  7/31 1.2 6 mg  8/01 1.6 2.5 mg  8/02 1.8    Goal of Therapy:  INR 2-3   Plan:  INR subtherapeutic, beginning to trend back up Will give reduced dose of 2.5 mg  x1 since DDI persist ISO Unasyn (until 8/02) and expect pt to remain sensitive, also INR effects of previous night's 6 mg dose not fully seen yet --Daily INR per protocol --CBC at least every 3 days per  protocol  10/02, PharmD Clinical Pharmacist   07/13/2021,7:04 AM

## 2021-07-13 NOTE — Consult Note (Signed)
PHARMACY CONSULT NOTE  Pharmacy Consult for Electrolyte Monitoring and Replacement   Recent Labs: Potassium (mmol/L)  Date Value  07/13/2021 3.5  10/28/2014 4.0   Magnesium (mg/dL)  Date Value  29/52/8413 2.0  10/26/2014 2.1   Calcium (mg/dL)  Date Value  24/40/1027 9.1   Calcium, Total (mg/dL)  Date Value  25/36/6440 8.6   Albumin (g/dL)  Date Value  34/74/2595 3.0 (L)  10/24/2014 3.7   Phosphorus (mg/dL)  Date Value  63/87/5643 3.3   Sodium (mmol/L)  Date Value  07/13/2021 145  10/28/2014 144   Assessment: Patient is an 87 y/o M with medical history including history of CVA, diabetes, HLD, HTN, history of cerebral arterial thrombosis who was admitted after being found down, unresponsive after wellness check. Patient was intubated briefly from 7/25 - 7/26. Pharmacy consulted to assist with electrolyte monitoring and replacement as indicated.   Goal of Therapy:  Electrolytes within normal limits  Plan:  Na resolved now WNL MIVF D5W stopped 7/31 @ 1209 K 3.5, no replacement indicated at this time IV lasix 40 mg BID started 8/1 AM All other electrolytes remain WNL. Follow up electrolytes with AM labs  Raiford Noble, PharmD 07/13/2021 11:47 AM

## 2021-07-13 NOTE — Progress Notes (Addendum)
PROGRESS NOTE   HPI was taken from NP [place]: 87 yo male presenting to [facility] ED via EMS after being found on the ground of his home unresponsive by police during a wellness check.  Per ED documentation and police report they were called to the patient's house for a welfare check possibly by a neighbor, and that no one had seen him for roughly 4 days.  When police arrived they found the patient down on the ground in the kitchen covered in feces and insects. ED course:  Initial vitals: T-100.2, tachypneic at 36, tachycardic at 125, BP stable at 114/52 & SPO2 97% with bag-valve-mask support Significant labs: Hyperkalemia at 5.1, acute renal failure BUN/Cr-133/5.10, AST elevated at 78, total bilirubin elevated at 1.8, CK indicative of rhabdomyolysis at 3225, troponin elevated at 201> 229, lactic acidosis: 2.8> 3.6, mild leukocytosis with left shift at 13.7, hemoglobin elevated at 17.6.  CTh negative for acute intracranial abnormality.  CT cervical spine negative for fracture.  CXR reveals LLL atelectasis versus developing infiltrate.  UA not consistent with UTI.   Patient intubated emergently and placed on mechanical ventilation due to inability to protect airway in unresponsive state.  Sepsis protocol initiated and patient received 3.5 L of IV fluid resuscitation and empiric antibiotic coverage. PCCM consulted for admission and further management.  As per NP Graves: 07/05/2021-patient admitted to ICU after being found unresponsive at home requiring emergent intubation and mechanical ventilation in the ER.  Work-up for rhabdomyolysis and sepsis Patient responsive to verbal stimuli but lethargic and inconsistent.  Able to open eyes without tracking or focus, with persistent stimulation able to give thumbs up in both hands to command and wiggle toes.  Possibly still holding onto intubation medications in the setting of acute renal failure.  Hospital course from Dr. Jimmye Norman 7/28-07/13/21: By the time I  started seeing the pt had been extubated. Pt was found have hypernatremia and was treated w/ D5W for a couple of days and this has since resolved. Also, pt is still on IV unasyn (started in the ICU) for aspiration pneumonia. Furthermore, pt was found to have b/l pleural effusion R >L as per CXR and pt was started on IV lasix to diurese the pt. PT/OT recs SNF but pt is hesitant about this & there is no way pt will be able to go home in the state that he is in.    [name].  OTL:[id number] DOB: 05-Nov-1932 DOA: 07/05/2021 PCP: Valera Castle, MD    Assessment & Plan:   Active Problems:   Acute respiratory failure (HCC)   Elevated CK   On mechanically assisted ventilation (HCC)   Uncontrolled type 2 diabetes mellitus ([place])   Acute metabolic encephalopathy   Acute kidney injury ([place])   Pressure injury of skin of sacral region   Sepsis: met criteria w/ leukocytosis, fever, tachycardia, elevated lactic acid & aspiration pneumonia. Blood cxs NGTD. Urine cx no growth. Continue on IV unasyn. Resolved   Aspiration pneumonia: continue on IV unasyn x 7 days, bronchodilators & encourage incentive spirometry   B/l pleural effusions & fluid overload: R>L as per CXR. Continue on IV lasix. Monitor I/Os  Hypernatremia: resolved  Hypophosphatemia:  WNL today    AKI: resolved   Rhabdomyolysis: resolved    Acute metabolic encephalopathy: mental status is labile. Likely secondary to all above. Hx of CVA, cerebral arterial thrombosis, on warfarin. Pharmacy to monitor & dose warfarin   Elevated troponin: now with ST Elevation 07/27. Echo 07/26  EF 65 to 70% with moderate asymmetric left ventricular hypertrophy of the septal segment; mildly increased right ventricular wall thickness, RBBB, bi-atrial enlargement & borderline LAFB noted on 12 lead. Favor demand ischemia as per ICU    DM2: HbA1c 8.8, uncontrolled. Continue on glargine, SSI w/ accuchecks  Dysphagia: continue on dysphagia II  diet as per speech   HLD: continue on statin   HTN: continue on metoprolol   Sacral Pressure injury & scattered abrasions: present on admission. Unknown stage. Continue w/ wound care   DVT prophylaxis: warfarin  Code Status: full  Family Communication:  Disposition Plan: therapy recs SNF  Level of care: Progressive Cardiac  Status is: Inpatient  Remains inpatient appropriate because:Ongoing diagnostic testing needed not appropriate for outpatient work up, Unsafe d/c plan, IV treatments appropriate due to intensity of illness or inability to take PO, and Inpatient level of care appropriate due to severity of illness, still needs IV lasix as pt has a lot fluid   Dispo: The patient is from: Home              Anticipated d/c is to: SNF               Patient currently is not medically stable to d/c.   Difficult to place patient : unclear   Consultants:  Palliative care ICU  Procedures:   Antimicrobials:   Subjective: Pt c/o generalized weakness  Objective: Vitals:   07/12/21 2006 07/13/21 0431 07/13/21 0835 07/13/21 1246  BP: (!) 124/57 (!) 106/48 (!) 116/51 (!) 107/50  Pulse: (!) 55 (!) 46 86 (!) 47  Resp: 16 16 (!) 24 18  Temp: 98.2 F (36.8 C) (!) 97.5 F (36.4 C) 97.7 F (36.5 C) 98 F (36.7 C)  TempSrc: Oral Oral    SpO2: 90% 91% 90% 93%  Weight:      Height:       No intake or output data in the 24 hours ending 07/13/21 1428  Filed Weights   07/07/21 0500 07/08/21 0500 07/10/21 0500  Weight: 100 kg 101 kg 102.4 kg    Examination:  General exam: Appears calm but uncomfortable  Respiratory system: decreased breath sounds b/l R>L  Cardiovascular system: S1/S2+. No rub or clicks   Gastrointestinal system: Abd is soft, NT, ND & hypoactive bowel sounds b/l   Central nervous system: Alert and awake. Moves all extremities  Psychiatry: Judgement and insight appears poor. Flat mood and affect    Data Reviewed: I have personally reviewed following labs  and imaging studies  CBC: Recent Labs  Lab 07/07/21 0149 07/09/21 0308 07/10/21 0541 07/11/21 0739 07/12/21 1630 07/13/21 1109  WBC 9.9 10.4 10.8* 13.2* 15.0* 17.5*  NEUTROABS 8.0*  --   --   --  12.9*  --   HGB 16.7 15.2 15.6 16.0 15.2 16.3  HCT 48.9 45.8 47.1 47.8 45.9 48.2  MCV 100.8* 104.1* 104.0* 100.2* 101.3* 101.9*  PLT 176 175 175 210 213 384   Basic Metabolic Panel: Recent Labs  Lab 07/09/21 0308 07/09/21 1450 07/10/21 0541 07/10/21 1703 07/11/21 0739 07/12/21 1630 07/13/21 1109  NA 152*   < > 150* 148* 145 147* 145  K 3.6  --  3.8  --  3.5 3.4* 3.5  CL 117*  --  113*  --  106 107 106  CO2 31  --  32  --  30 34* 32  GLUCOSE 248*  --  252*  --  224* 201* 190*  BUN 42*  --  32*  --  25* 27* 27*  CREATININE 0.97  --  0.75  --  0.84 0.87 0.78  CALCIUM 9.4  --  9.7  --  9.2 9.0 9.1  MG 2.4  --  2.3  --  2.0 2.2 2.0  PHOS 2.4*  --  3.3  --  2.9 3.0 3.3   < > = values in this interval not displayed.   GFR: Estimated Creatinine Clearance: 83.1 mL/min (by C-G formula based on SCr of 0.78 mg/dL). Liver Function Tests: No results for input(s): AST, ALT, ALKPHOS, BILITOT, PROT, ALBUMIN in the last 168 hours.  No results for input(s): LIPASE, AMYLASE in the last 168 hours. No results for input(s): AMMONIA in the last 168 hours. Coagulation Profile: Recent Labs  Lab 07/10/21 0541 07/11/21 0739 07/12/21 1630 07/12/21 1939 07/13/21 1109  INR 1.5* 1.2 1.6* 1.7* 1.8*   Cardiac Enzymes: Recent Labs  Lab 07/08/21 0920 07/08/21 1849 07/09/21 0308 07/09/21 0923 07/10/21 0541  CKTOTAL 786* 656* 550* 455* 288   BNP (last 3 results) No results for input(s): PROBNP in the last 8760 hours. HbA1C: No results for input(s): HGBA1C in the last 72 hours.  CBG: Recent Labs  Lab 07/12/21 1231 07/12/21 1556 07/12/21 2011 07/13/21 0836 07/13/21 1248  GLUCAP 181* 180* 217* 126* 173*   Lipid Profile: No results for input(s): CHOL, HDL, LDLCALC, TRIG, CHOLHDL,  LDLDIRECT in the last 72 hours. Thyroid Function Tests: No results for input(s): TSH, T4TOTAL, FREET4, T3FREE, THYROIDAB in the last 72 hours.  Anemia Panel: No results for input(s): VITAMINB12, FOLATE, FERRITIN, TIBC, IRON, RETICCTPCT in the last 72 hours. Sepsis Labs: No results for input(s): PROCALCITON, LATICACIDVEN in the last 168 hours.   Recent Results (from the past 240 hour(s))  Resp Panel by RT-PCR (Flu A&B, Covid) Nasopharyngeal Swab     Status: None   Collection Time: 07/05/21  8:22 PM   Specimen: Nasopharyngeal Swab; Nasopharyngeal(NP) swabs in vial transport medium  Result Value Ref Range Status   SARS Coronavirus 2 by RT PCR NEGATIVE NEGATIVE Final    Comment: (NOTE) SARS-CoV-2 target nucleic acids are NOT DETECTED.  The SARS-CoV-2 RNA is generally detectable in upper respiratory specimens during the acute phase of infection. The lowest concentration of SARS-CoV-2 viral copies this assay can detect is 138 copies/mL. A negative result does not preclude SARS-Cov-2 infection and should not be used as the sole basis for treatment or other patient management decisions. A negative result may occur with  improper specimen collection/handling, submission of specimen other than nasopharyngeal swab, presence of viral mutation(s) within the areas targeted by this assay, and inadequate number of viral copies(<138 copies/mL). A negative result must be combined with clinical observations, patient history, and epidemiological information. The expected result is Negative.  Fact Sheet for Patients:  EntrepreneurPulse.com.au  Fact Sheet for Healthcare Providers:  IncredibleEmployment.be  This test is no t yet approved or cleared by the [place] FDA and  has been authorized for detection and/or diagnosis of SARS-CoV-2 by FDA under an Emergency Use Authorization (EUA). This EUA will remain  in effect (meaning this test can be used) for the  duration of the COVID-19 declaration under Section 564(b)(1) of the Act, 21 U.S.C.section 360bbb-3(b)(1), unless the authorization is terminated  or revoked sooner.       Influenza A by PCR NEGATIVE NEGATIVE Final   Influenza B by PCR NEGATIVE NEGATIVE Final    Comment: (NOTE) The Xpert Xpress SARS-CoV-2/FLU/RSV plus assay is intended as an  aid in the diagnosis of influenza from Nasopharyngeal swab specimens and should not be used as a sole basis for treatment. Nasal washings and aspirates are unacceptable for Xpert Xpress SARS-CoV-2/FLU/RSV testing.  Fact Sheet for Patients: EntrepreneurPulse.com.au  Fact Sheet for Healthcare Providers: IncredibleEmployment.be  This test is not yet approved or cleared by the [place] FDA and has been authorized for detection and/or diagnosis of SARS-CoV-2 by FDA under an Emergency Use Authorization (EUA). This EUA will remain in effect (meaning this test can be used) for the duration of the COVID-19 declaration under Section 564(b)(1) of the Act, 21 U.S.C. section 360bbb-3(b)(1), unless the authorization is terminated or revoked.  Performed at [facility], [address] [place], [place] [address]   Blood Culture (routine x 2)     Status: None   Collection Time: 07/05/21  8:33 PM   Specimen: BLOOD  Result Value Ref Range Status   Specimen Description BLOOD RIGHT ANTECUBITAL  Final   Special Requests   Final    BOTTLES DRAWN AEROBIC AND ANAEROBIC Blood Culture results may not be optimal due to an inadequate volume of blood received in culture bottles   Culture   Final    NO GROWTH 8 DAYS Performed at [facility], [address], [place], [place] [address]    Report Status 07/13/2021 FINAL  Final  Urine Culture     Status: None   Collection Time: 07/05/21  8:33 PM   Specimen: Urine, Random  Result Value Ref Range Status   Specimen Description   Final    URINE,  RANDOM Performed at [facility], [address] [place], [place] [address]    Special Requests   Final    NONE Performed at [facility], [address] [place], [place] [address]    Culture   Final    NO GROWTH Performed at [facility], [place] [address], [place], [place] [address]    Report Status 07/07/2021 FINAL  Final  Blood Culture (routine x 2)     Status: None   Collection Time: 07/05/21  8:38 PM   Specimen: BLOOD  Result Value Ref Range Status   Specimen Description BLOOD BLOOD RIGHT FOREARM  Final   Special Requests   Final    BOTTLES DRAWN AEROBIC AND ANAEROBIC Blood Culture adequate volume   Culture   Final    NO GROWTH 8 DAYS Performed at [facility], [address] [place], [place] [address]    Report Status 07/13/2021 FINAL  Final  MRSA Next Gen by PCR, Nasal     Status: None   Collection Time: 07/06/21 12:34 AM   Specimen: Nasal Mucosa; Nasal Swab  Result Value Ref Range Status   MRSA by PCR Next Gen NOT DETECTED NOT DETECTED Final    Comment: (NOTE) The GeneXpert MRSA Assay (FDA approved for NASAL specimens only), is one component of a comprehensive MRSA colonization surveillance program. It is not intended to diagnose MRSA infection nor to guide or monitor treatment for MRSA infections. Test performance is not FDA approved in patients less than 65 years old. Performed at [facility], [address] [place], [place] [address]   MRSA Next Gen by PCR, Nasal     Status: None   Collection Time: 07/06/21 10:00 AM   Specimen: Nasal Mucosa; Nasal Swab  Result Value Ref Range Status   MRSA by PCR Next Gen NOT DETECTED NOT DETECTED Final    Comment: (NOTE)  The GeneXpert MRSA Assay (FDA approved for NASAL specimens only), is one component of a comprehensive MRSA colonization surveillance program. It is not intended to diagnose MRSA infection nor to guide or monitor treatment for MRSA infections. Test performance  is not FDA approved in patients less than 42 years old. Performed at [facility], [address] [place], [place] [address]          Radiology Studies: DG Chest [place] 1 View  Result Date: 07/12/2021 CLINICAL DATA:  Shortness of breath EXAM: PORTABLE CHEST 1 VIEW COMPARISON:  07/06/2021 FINDINGS: Interval removal of NG tube and endotracheal tube. Bilateral lower lobe airspace opacities, right greater than left, worsening since prior study. Heart is normal size. Small bilateral effusions. No pneumothorax. IMPRESSION: Worsening bilateral lower lobe airspace disease and effusions, right greater than left. Electronically Signed   By: Rolm Baptise M.D.   On: 07/12/2021 11:17   [place] EKG SITE RITE  Result Date: 07/13/2021 If Site Rite image not attached, placement could not be confirmed due to current cardiac rhythm.       Scheduled Meds:  vitamin C  500 mg Oral Daily   aspirin EC  81 mg Oral Daily   atorvastatin  40 mg Oral QHS   Chlorhexidine Gluconate Cloth  6 each Topical Daily   collagenase  1 application Topical Daily   docusate sodium  100 mg Oral BID   feeding supplement (NEPRO CARB STEADY)  237 mL Oral BID BM   furosemide  40 mg Intravenous BID   Gerhardt's butt cream   Topical BID   insulin aspart  0-20 Units Subcutaneous TID WC   insulin aspart  0-5 Units Subcutaneous QHS   insulin aspart  2 Units Subcutaneous TID WC   insulin glargine-yfgn  12 Units Subcutaneous Daily   metoprolol succinate  25 mg Oral BID   multivitamin with minerals  1 tablet Oral Daily   pantoprazole  40 mg Oral QHS   tamsulosin  0.4 mg Oral Daily   vitamin E  400 Units Oral Daily   warfarin  2.5 mg Oral ONCE-1600   Warfarin - Pharmacist Dosing Inpatient   Does not apply q1600   Continuous Infusions:  sodium chloride 250 mL (07/13/21 0550)   ampicillin-sulbactam (UNASYN) IV 3 g (07/13/21 0918)     LOS: 8 days    Time spent: 25 mins     Wyvonnia Dusky, MD [facility] Pager [phone]  If 7PM-7AM, please contact night-coverage 07/13/2021, 2:28 PM

## 2021-07-14 DIAGNOSIS — J9601 Acute respiratory failure with hypoxia: Secondary | ICD-10-CM | POA: Diagnosis not present

## 2021-07-14 DIAGNOSIS — G9341 Metabolic encephalopathy: Secondary | ICD-10-CM | POA: Diagnosis not present

## 2021-07-14 DIAGNOSIS — R748 Abnormal levels of other serum enzymes: Secondary | ICD-10-CM | POA: Diagnosis not present

## 2021-07-14 DIAGNOSIS — Z9911 Dependence on respirator [ventilator] status: Secondary | ICD-10-CM

## 2021-07-14 DIAGNOSIS — N179 Acute kidney failure, unspecified: Secondary | ICD-10-CM | POA: Diagnosis not present

## 2021-07-14 LAB — CBC
HCT: 44.8 % (ref 39.0–52.0)
Hemoglobin: 15.2 g/dL (ref 13.0–17.0)
MCH: 34.4 pg — ABNORMAL HIGH (ref 26.0–34.0)
MCHC: 33.9 g/dL (ref 30.0–36.0)
MCV: 101.4 fL — ABNORMAL HIGH (ref 80.0–100.0)
Platelets: 221 10*3/uL (ref 150–400)
RBC: 4.42 MIL/uL (ref 4.22–5.81)
RDW: 12.9 % (ref 11.5–15.5)
WBC: 15.9 10*3/uL — ABNORMAL HIGH (ref 4.0–10.5)
nRBC: 0 % (ref 0.0–0.2)

## 2021-07-14 LAB — PROTIME-INR
INR: 2.1 — ABNORMAL HIGH (ref 0.8–1.2)
Prothrombin Time: 23.1 seconds — ABNORMAL HIGH (ref 11.4–15.2)

## 2021-07-14 LAB — GLUCOSE, CAPILLARY
Glucose-Capillary: 129 mg/dL — ABNORMAL HIGH (ref 70–99)
Glucose-Capillary: 177 mg/dL — ABNORMAL HIGH (ref 70–99)
Glucose-Capillary: 155 mg/dL — ABNORMAL HIGH (ref 70–99)
Glucose-Capillary: 167 mg/dL — ABNORMAL HIGH (ref 70–99)

## 2021-07-14 LAB — BASIC METABOLIC PANEL
Anion gap: 5 (ref 5–15)
BUN: 27 mg/dL — ABNORMAL HIGH (ref 8–23)
CO2: 35 mmol/L — ABNORMAL HIGH (ref 22–32)
Calcium: 8.9 mg/dL (ref 8.9–10.3)
Chloride: 102 mmol/L (ref 98–111)
Creatinine, Ser: 0.74 mg/dL (ref 0.61–1.24)
GFR, Estimated: >60 mL/min (ref >60)
Glucose, Bld: 138 mg/dL — ABNORMAL HIGH (ref 70–99)
Potassium: 3 mmol/L — ABNORMAL LOW (ref 3.5–5.1)
Sodium: 142 mmol/L (ref 135–145)

## 2021-07-14 LAB — MAGNESIUM: Magnesium: 2.1 mg/dL (ref 1.7–2.4)

## 2021-07-14 LAB — PHOSPHORUS: Phosphorus: 3.5 mg/dL (ref 2.5–4.6)

## 2021-07-14 MED ORDER — WARFARIN SODIUM 2.5 MG PO TABS
2.5000 mg | ORAL_TABLET | Freq: Once | ORAL | Status: AC
  Administered 2021-07-14: 2.5 mg via ORAL
  Filled 2021-07-14: qty 1

## 2021-07-14 MED ORDER — POTASSIUM CHLORIDE CRYS ER 20 MEQ PO TBCR
40.0000 meq | EXTENDED_RELEASE_TABLET | ORAL | Status: AC
  Administered 2021-07-14 (×2): 40 meq via ORAL
  Filled 2021-07-14 (×2): qty 2

## 2021-07-14 NOTE — Progress Notes (Signed)
  Chaplain On-Call received referral from Chaplain Jenelle Mages to follow up with the patient to learn his readiness for completion of Advance Directives documents.  Chaplain attempted to visit but the patient was sleeping soundly.  Chaplain will refer to other Chaplains for support later today.  Chaplain Evelena Peat M.Div., [facility]

## 2021-07-14 NOTE — Progress Notes (Signed)
PT Cancellation Note  Patient Details Name: Gabriel [name]. MRN: [id number] DOB: 09/06/1932   Cancelled Treatment:    Reason Eval/Treat Not Completed: Patient declined, no reason specified. Pt declining due to fatigue from prior OT session from earlier today. PT will re-attempt as able.   Delphia Grates. Fairly IV, PT, DPT Physical Therapist- [place]  [facility]  07/14/2021, 2:10 PM

## 2021-07-14 NOTE — Consult Note (Signed)
PHARMACY CONSULT NOTE  Pharmacy Consult for Electrolyte Monitoring and Replacement   Recent Labs: Potassium (mmol/L)  Date Value  07/14/2021 3.0 (L)  10/28/2014 4.0   Magnesium (mg/dL)  Date Value  01/77/9390 2.1  10/26/2014 2.1   Calcium (mg/dL)  Date Value  30/08/2329 8.9   Calcium, Total (mg/dL)  Date Value  07/62/2633 8.6   Albumin (g/dL)  Date Value  35/45/6256 3.0 (L)  10/24/2014 3.7   Phosphorus (mg/dL)  Date Value  38/93/7342 3.5   Sodium (mmol/L)  Date Value  07/14/2021 142  10/28/2014 144   Assessment: Patient is an 87 y/o M with medical history including history of CVA, diabetes, HLD, HTN, history of cerebral arterial thrombosis who was admitted after being found down, unresponsive after wellness check. Patient was intubated briefly from 7/25 - 7/26. Pharmacy consulted to assist with electrolyte monitoring and replacement as indicated.    Goal of Therapy:  Electrolytes within normal limits  Plan:  K 3.0, will give PO Kcl 40 mEq x2 doses IV lasix 40 mg BID started 8/1 AM All other electrolytes remain WNL. Follow up electrolytes with AM labs  Raiford Noble, PharmD 07/14/2021 7:16 AM

## 2021-07-14 NOTE — TOC Progression Note (Addendum)
Transition of Care (TOC) - Progression Note    Patient Details  Name: Gabriel [name]. MRN: [id number] Date of Birth: Jul 10, 1932  Transition of Care [facility]) CM/SW Contact  Gildardo Griffes, [place] Phone Number: 07/14/2021, 10:11 AM  Clinical Narrative:     Update: [facility] making a bed offer for when patient medically stable, MD updated.    Gavin Pound with [facility] reports she is working on communicating with [facility] if [facility] can be covered to go to SNF at their facility and is looking at him coming via his Medicare as well. She has sent referral to her director, is pending acceptance at this time.   Expected Discharge Plan: Skilled Nursing Facility Barriers to Discharge: Continued Medical Work up  Expected Discharge Plan and Services Expected Discharge Plan: Skilled Nursing Facility     Post Acute Care Choice: Skilled Nursing Facility Living arrangements for the past 2 months: Single Family Home                                       Social Determinants of Health (SDOH) Interventions    Readmission Risk Interventions No flowsheet data found.

## 2021-07-14 NOTE — Evaluation (Addendum)
Occupational Therapy Treatment Patient Details Name: Gabriel [name]. MRN: [id number] DOB: 1932/02/05 Today's Date: 07/14/2021    History of Present Illness 87 yo male presenting to [facility] ED via EMS after being found on the ground of his home unresponsive by police during a wellness check.  Per ED documentation and police report they were called to the patient's house having been called for a welfare check-in, apparently no one had seen him in ~4 days and he was covered in feces and insects when they found him.  Pt intubated for 1 day.   Clinical Impression   Pt awake and alert, agreeable to participate in OT this day.  Participated in [place] for hands, elbows, shoulders, working to increase circulation and strength for self care.  Instructed in gross grasping with rolled washcloths and encouraged completion daily when OT is not present.  Performed elbow AROM x10 and active assisted shoulder flex x2 sets 10.  Mod Ax2 to transfer to EOB.  Initial max A for static sitting, progressing to close supv/CGA once in midline.  Practiced reaching toward feet for LB dressing, but R hand reaches to mid lower leg only, L able to reach top of sock, min A for dynamic sitting.  Performed sit<>stand x2 max A; tolerated <10 second stand with strong posterior lean.  Assisted back to supine with mod A x2.  Pt making good improvements with functional use of BUEs this day, with ability to hold and sip cup with 2 hands.  SN assisted with spoon to mouth for pills, but OT encouraged pt make more active attempts to self feed before assist is given.  Left pt in bed, phone in reach, call light in place, bed locked, all needs met.  Pt reported fatigue by end of sessin but tolerated tx well.               Recommendations for Other Services       Precautions / Restrictions Precautions Precautions: Fall Restrictions Weight Bearing Restrictions: No      Mobility Bed Mobility Overal bed mobility: Needs  Assistance Bed Mobility: Supine to Sit;Sit to Supine     Supine to sit: Max assist;+2 for physical assistance Sit to supine: +2 for physical assistance;Mod assist   General bed mobility comments: pt able to assist with shifting legs to side of bed; max A to support trunk during supine<>sit    Transfers Overall transfer level: Needs assistance Equipment used: Rolling walker (2 wheeled) Transfers: Sit to/from Stand Sit to Stand: +2 physical assistance;Max assist;From elevated surface         General transfer comment: sit<>stand from EOB; stood <10 seconds with max support of 2 d/t posterior lean    Balance Overall balance assessment: Needs assistance Sitting-balance support: Bilateral upper extremity supported Sitting balance-Leahy Scale: Fair Sitting balance - Comments: able to sit EOB approx 20 min with initial mod A d/t posterior lean.  Once in midline, pt sat with close supv for static sitting, CGA for dynamic sitting   Standing balance support: Bilateral upper extremity supported Standing balance-Leahy Scale: Poor                             ADL either performed or assessed with clinical judgement   ADL Overall ADL's : Needs assistance/impaired Eating/Feeding: Minimal assistance Eating/Feeding Details (indicate cue type and reason): set up to use 2 hands to bring cup to mouth to sip water; SN spoon fed  pt his pills         Lower Body Bathing: Total assistance;Bed level       Lower Body Dressing: Total assistance Lower Body Dressing Details (indicate cue type and reason): attempted to reach to pull up socks while sitting EOB; L hand reached to top of sock, R hand reaches only to mid lower leg             Functional mobility during ADLs: +2 for physical assistance;Maximal assistance General ADL Comments: max Ax2 supine to sit, mod Ax2 sit to supine; attempted sit to stand max 2 person assist with RW     Vision Patient Visual Report: No change from  baseline                  Pertinent Vitals/Pain Pain Assessment: Faces Faces Pain Scale: Hurts a little bit Pain Location: catheter irritating Pain Descriptors / Indicators: Sore Pain Intervention(s): Limited activity within patient's tolerance;Monitored during session;Other (comment) (SN gave tylenol during session)                       Communication     Cognition Arousal/Alertness: Awake/alert Behavior During Therapy: WFL for tasks assessed/performed Overall Cognitive Status: No family/caregiver present to determine baseline cognitive functioning                                 General Comments: pt able to follow commands with verbal and tactile cueing                                                                                                 Acute Rehab OT Goals Patient Stated Goal: to go home; pt now agreeable the more therapy is necessary before return home OT Goal Formulation: With patient Time For Goal Achievement: 07/21/21 Potential to Achieve Goals: Fair   Min 1X/week                             AM-PAC OT "6 Clicks" Daily Activity     Outcome Measure Help from another person eating meals?: A Little Help from another person taking care of personal grooming?: A Lot Help from another person toileting, which includes using toliet, bedpan, or urinal?: Total Help from another person bathing (including washing, rinsing, drying)?: Total Help from another person to put on and taking off regular upper body clothing?: A Lot Help from another person to put on and taking off regular lower body clothing?: Total 6 Click Score: 10   End of Session Equipment Utilized During Treatment: Gait belt Nurse Communication: Other (comment)  Activity Tolerance: Patient tolerated treatment well Patient left: in bed;with call bell/phone within reach;with bed alarm set  OT Visit Diagnosis: Other abnormalities  of gait and mobility (R26.89);Muscle weakness (generalized) (M62.81);History of falling (Z91.81)                Time:  - 51 min   Charges:  OT Treatments $Self Care/Home Management : 8-22 mins $  Therapeutic Exercise: 23-37 mins   Leta Speller, MS, OTR/L  Darleene Cleaver 07/14/2021, 5:42 PM

## 2021-07-14 NOTE — Progress Notes (Signed)
ANTICOAGULATION CONSULT NOTE  Pharmacy Consult for warfarin Indication: h/o cerebral arterial thrombosis  Patient Measurements: Height: 6' 2.02" (188 cm) Weight: 102.2 kg (225 lb 6.4 oz) IBW/kg (Calculated) : 82.24  Labs: Recent Labs    07/12/21 1630 07/12/21 1939 07/13/21 1109 07/14/21 0536  HGB 15.2  --  16.3 15.2  HCT 45.9  --  48.2 44.8  PLT 213  --  205 221  LABPROT 19.1* 19.5* 21.0* 23.1*  INR 1.6* 1.7* 1.8* 2.1*  CREATININE 0.87  --  0.78 0.74     Estimated Creatinine Clearance: 83 mL/min (by C-G formula based on SCr of 0.74 mg/dL).   Medical History: Past Medical History:  Diagnosis Date   Cerebral arterial thrombosis 08/09/2011   Cerebrovascular accident, old 10/27/2015   Diabetes mellitus without complication (HCC)    Metformin   Essential (primary) hypertension 09/25/2014   HLD (hyperlipidemia) 10/27/2015   Hypertension    Nephrolithiasis    Pure hypercholesterolemia 09/25/2014   Type 2 diabetes mellitus (HCC) 09/25/2014    Assessment: 87 year old male on warfarin for h/o cerebral arterial thrombosis. Home dose of warfarin 5 mg daily. Patient presented after having been found down at his home; he was briefly intubated. Pharmacy consulted to restart warfarin.  7/26-27: INR on arrival in goal, but clarithro x1 & unasyn started 7/26 INR inc'd to 3.3 following day requiring hold x2d (7/27-28). 7/29: resumed dosing @1mg   LFTs: Mild transaminitis on admission. Albumin 3. Tbili 1.8 DDI: Unasyn (completed 8/2)  Date INR Plan  7/25 2.2 Last dose unk.  7/26 2.4 Warfarin 2.5 mg  7/27 3.3 Hold  7/28 3.2 Hold  7/29 2.9 1 mg  7/30 1.5 5 mg  7/31 1.2 6 mg  8/01 1.6 2.5 mg  8/02 1.8 2.5 mg  8/03 2.1 2.5 mg   Goal of Therapy:  INR 2-3   Plan:  INR therapeutic Will give 2.5 mg tonight Daily INR per protocol CBC at least every 3 days per protocol  10/03, PharmD Clinical Pharmacist   07/14/2021,7:12 AM

## 2021-07-14 NOTE — Progress Notes (Addendum)
PROGRESS NOTE   HPI was taken from NP [place]: 87 yo male presenting to [facility] ED via EMS after being found on the ground of his home unresponsive by police during a wellness check.  Per ED documentation and police report they were called to the patient's house for a welfare check possibly by a neighbor, and that no one had seen him for roughly 4 days.  When police arrived they found the patient down on the ground in the kitchen covered in feces and insects. ED course:  Initial vitals: T-100.2, tachypneic at 36, tachycardic at 125, BP stable at 114/52 & SPO2 97% with bag-valve-mask support Significant labs: Hyperkalemia at 5.1, acute renal failure BUN/Cr-133/5.10, AST elevated at 78, total bilirubin elevated at 1.8, CK indicative of rhabdomyolysis at 3225, troponin elevated at 201> 229, lactic acidosis: 2.8> 3.6, mild leukocytosis with left shift at 13.7, hemoglobin elevated at 17.6.  CTh negative for acute intracranial abnormality.  CT cervical spine negative for fracture.  CXR reveals LLL atelectasis versus developing infiltrate.  UA not consistent with UTI.   Patient intubated emergently and placed on mechanical ventilation due to inability to protect airway in unresponsive state.  Sepsis protocol initiated and patient received 3.5 L of IV fluid resuscitation and empiric antibiotic coverage. PCCM consulted for admission and further management.  As per NP Graves: 07/05/2021-patient admitted to ICU after being found unresponsive at home requiring emergent intubation and mechanical ventilation in the ER.  Work-up for rhabdomyolysis and sepsis Patient responsive to verbal stimuli but lethargic and inconsistent.  Able to open eyes without tracking or focus, with persistent stimulation able to give thumbs up in both hands to command and wiggle toes.  Possibly still holding onto intubation medications in the setting of acute renal failure.  Hospital course from Dr. Jimmye Norman 7/28-07/13/21: By the time I  started seeing the pt had been extubated. Pt was found have hypernatremia and was treated w/ D5W for a couple of days and this has since resolved. Also, pt is still on IV unasyn (started in the ICU) for aspiration pneumonia. Furthermore, pt was found to have b/l pleural effusion R >L as per CXR and pt was started on IV lasix to diurese the pt. PT/OT recs SNF but pt is hesitant about this & there is no way pt will be able to go home in the state that he is in.  8/3- no complaints. He thinks he is "fine. No cp  [name].  KFM:[id number] DOB: 1932/09/13 DOA: 07/05/2021 PCP: Valera Castle, MD    Assessment & Plan:   Active Problems:   Acute respiratory failure (HCC)   Elevated CK   On mechanically assisted ventilation (HCC)   Uncontrolled type 2 diabetes mellitus ([place])   Acute metabolic encephalopathy   Acute kidney injury ([place])   Pressure injury of skin of sacral region   Sepsis: met criteria w/ leukocytosis, fever, tachycardia, elevated lactic acid & aspiration pneumonia. Blood cxs NGTD. Urine cx no growth.  8/3- improved Completed abx with iv unasyn   Aspiration pneumonia:  Completed unasyn course Is Mdi   B/l pleural effusions & fluid overload: R>L as per CXR.  On RA.  Dc iv lasix   Hypernatremia:  Improved. Dc lasix  Hypokalemia- K 3.0 Will replace Monitor   Hypophosphatemia:  WNL    AKI: resolved   Rhabdomyolysis: resolved    Acute metabolic encephalopathy: mental status is labile. Likely secondary to all above. Hx of CVA, cerebral arterial thrombosis, on  warfarin. Pharmacy to monitor & dose warfarin   Elevated troponin: now with ST Elevation 07/27. Echo 07/26 EF 65 to 70% with moderate asymmetric left ventricular hypertrophy of the septal segment; mildly increased right ventricular wall thickness, RBBB, bi-atrial enlargement & borderline LAFB noted on 12 lead. Favor demand ischemia as per ICU    DM2: HbA1c 8.8, uncontrolled. Continue on glargine,  SSI w/ accuchecks  Dysphagia: continue on dysphagia II diet as per speech   HLD: continue on statin   HTN: continue on metoprolol   Sacral Pressure injury & scattered abrasions: present on admission. Unknown stage. Continue w/ wound care   DVT prophylaxis: warfarin  Code Status: full  Family Communication:  Disposition Plan: therapy recs SNF  Level of care: Progressive Cardiac  Status is: Inpatient  Remains inpatient appropriate because:Ongoing diagnostic testing needed not appropriate for outpatient work up, Unsafe d/c plan, IV treatments appropriate due to intensity of illness or inability to take PO, and Inpatient level of care appropriate due to severity of illness, still needs IV lasix as pt has a lot fluid   Dispo: The patient is from: Home              Anticipated d/c is to: SNF               Patient currently is not medically stable to d/c.   Difficult to place patient : unclear   Consultants:  Palliative care ICU  Procedures:   Antimicrobials:   Subjective:  No dizziness. No fever  Objective: Vitals:   07/14/21 0115 07/14/21 0439 07/14/21 0802 07/14/21 1141  BP:  129/60 (!) 112/57 97/82  Pulse:  87 96 97  Resp:  _0 Temp:  97.8 F (36.6 C) (!) 97.5 F (36.4 C) 97.7 F (36.5 C)  TempSrc:  Oral Oral Oral  SpO2:  100% 95% 92%  Weight: 102.2 kg     Height:        Intake/Output Summary (Last 24 hours) at 07/14/2021 1613 Last data filed at 07/14/2021 0446 Gross per 24 hour  Intake 380 ml  Output 1500 ml  Net -1120 ml    Filed Weights   07/08/21 0500 07/10/21 0500 07/14/21 0115  Weight: 101 kg 102.4 kg 102.2 kg    Examination:  Calm, nad Cta anteriorly no wheezing Regular s1/s2 no gallop Sot benign +bs Mild edema b/l Mood and affect appropriate Awake and alert    Data Reviewed: I have personally reviewed following labs and imaging studies  CBC: Recent Labs  Lab 07/10/21 0541 07/11/21 0739 07/12/21 1630 07/13/21 1109  07/14/21 0536  WBC 10.8* 13.2* 15.0* 17.5* 15.9*  NEUTROABS  --   --  12.9*  --   --   HGB 15.6 16.0 15.2 16.3 15.2  HCT 47.1 47.8 45.9 48.2 44.8  MCV 104.0* 100.2* 101.3* 101.9* 101.4*  PLT 175 210 213 205 008   Basic Metabolic Panel: Recent Labs  Lab 07/10/21 0541 07/10/21 1703 07/11/21 0739 07/12/21 1630 07/13/21 1109 07/14/21 0536  NA 150* 148* 145 147* 145 142  K 3.8  --  3.5 3.4* 3.5 3.0*  CL 113*  --  106 107 106 102  CO2 32  --  30 34* 32 35*  GLUCOSE 252*  --  224* 201* 190* 138*  BUN 32*  --  25* 27* 27* 27*  CREATININE 0.75  --  0.84 0.87 0.78 0.74  CALCIUM 9.7  --  9.2 9.0 9.1 8.9  MG 2.3  --  2.0 2.2 2.0 2.1  PHOS 3.3  --  2.9 3.0 3.3 3.5   GFR: Estimated Creatinine Clearance: 83 mL/min (by C-G formula based on SCr of 0.74 mg/dL). Liver Function Tests: No results for input(s): AST, ALT, ALKPHOS, BILITOT, PROT, ALBUMIN in the last 168 hours.  No results for input(s): LIPASE, AMYLASE in the last 168 hours. No results for input(s): AMMONIA in the last 168 hours. Coagulation Profile: Recent Labs  Lab 07/11/21 0739 07/12/21 1630 07/12/21 1939 07/13/21 1109 07/14/21 0536  INR 1.2 1.6* 1.7* 1.8* 2.1*   Cardiac Enzymes: Recent Labs  Lab 07/08/21 0920 07/08/21 1849 07/09/21 0308 07/09/21 0923 07/10/21 0541  CKTOTAL 786* 656* 550* 455* 288   BNP (last 3 results) No results for input(s): PROBNP in the last 8760 hours. HbA1C: No results for input(s): HGBA1C in the last 72 hours.  CBG: Recent Labs  Lab 07/13/21 1248 07/13/21 1708 07/13/21 2114 07/14/21 0802 07/14/21 1142  GLUCAP 173* 164* 130* 129* 177*   Lipid Profile: No results for input(s): CHOL, HDL, LDLCALC, TRIG, CHOLHDL, LDLDIRECT in the last 72 hours. Thyroid Function Tests: No results for input(s): TSH, T4TOTAL, FREET4, T3FREE, THYROIDAB in the last 72 hours.  Anemia Panel: No results for input(s): VITAMINB12, FOLATE, FERRITIN, TIBC, IRON, RETICCTPCT in the last 72 hours. Sepsis  Labs: No results for input(s): PROCALCITON, LATICACIDVEN in the last 168 hours.   Recent Results (from the past 240 hour(s))  Resp Panel by RT-PCR (Flu A&B, Covid) Nasopharyngeal Swab     Status: None   Collection Time: 07/05/21  8:22 PM   Specimen: Nasopharyngeal Swab; Nasopharyngeal(NP) swabs in vial transport medium  Result Value Ref Range Status   SARS Coronavirus 2 by RT PCR NEGATIVE NEGATIVE Final    Comment: (NOTE) SARS-CoV-2 target nucleic acids are NOT DETECTED.  The SARS-CoV-2 RNA is generally detectable in upper respiratory specimens during the acute phase of infection. The lowest concentration of SARS-CoV-2 viral copies this assay can detect is 138 copies/mL. A negative result does not preclude SARS-Cov-2 infection and should not be used as the sole basis for treatment or other patient management decisions. A negative result may occur with  improper specimen collection/handling, submission of specimen other than nasopharyngeal swab, presence of viral mutation(s) within the areas targeted by this assay, and inadequate number of viral copies(<138 copies/mL). A negative result must be combined with clinical observations, patient history, and epidemiological information. The expected result is Negative.  Fact Sheet for Patients:  EntrepreneurPulse.com.au  Fact Sheet for Healthcare Providers:  IncredibleEmployment.be  This test is no t yet approved or cleared by the [place] FDA and  has been authorized for detection and/or diagnosis of SARS-CoV-2 by FDA under an Emergency Use Authorization (EUA). This EUA will remain  in effect (meaning this test can be used) for the duration of the COVID-19 declaration under Section 564(b)(1) of the Act, 21 U.S.C.section 360bbb-3(b)(1), unless the authorization is terminated  or revoked sooner.       Influenza A by PCR NEGATIVE NEGATIVE Final   Influenza B by PCR NEGATIVE NEGATIVE Final     Comment: (NOTE) The Xpert Xpress SARS-CoV-2/FLU/RSV plus assay is intended as an aid in the diagnosis of influenza from Nasopharyngeal swab specimens and should not be used as a sole basis for treatment. Nasal washings and aspirates are unacceptable for Xpert Xpress SARS-CoV-2/FLU/RSV testing.  Fact Sheet for Patients: EntrepreneurPulse.com.au  Fact Sheet for Healthcare Providers: IncredibleEmployment.be  This test is not yet approved or cleared by the  [place] Architectural technologist and has been authorized for detection and/or diagnosis of SARS-CoV-2 by FDA under an Print production planner (EUA). This EUA will remain in effect (meaning this test can be used) for the duration of the COVID-19 declaration under Section 564(b)(1) of the Act, 21 U.S.C. section 360bbb-3(b)(1), unless the authorization is terminated or revoked.  Performed at [facility], [address] [place], [place] [address]   Blood Culture (routine x 2)     Status: None   Collection Time: 07/05/21  8:33 PM   Specimen: BLOOD  Result Value Ref Range Status   Specimen Description BLOOD RIGHT ANTECUBITAL  Final   Special Requests   Final    BOTTLES DRAWN AEROBIC AND ANAEROBIC Blood Culture results may not be optimal due to an inadequate volume of blood received in culture bottles   Culture   Final    NO GROWTH 8 DAYS Performed at [facility], [address], [place], [place] [address]    Report Status 07/13/2021 FINAL  Final  Urine Culture     Status: None   Collection Time: 07/05/21  8:33 PM   Specimen: Urine, Random  Result Value Ref Range Status   Specimen Description   Final    URINE, RANDOM Performed at [facility], [address], [place], [place] [address]    Special Requests   Final    NONE Performed at [facility], [address] [place], [place] [address]    Culture   Final    NO GROWTH Performed at [facility],  [address], [place], [place] [address]    Report Status 07/07/2021 FINAL  Final  Blood Culture (routine x 2)     Status: None   Collection Time: 07/05/21  8:38 PM   Specimen: BLOOD  Result Value Ref Range Status   Specimen Description BLOOD BLOOD RIGHT FOREARM  Final   Special Requests   Final    BOTTLES DRAWN AEROBIC AND ANAEROBIC Blood Culture adequate volume   Culture   Final    NO GROWTH 8 DAYS Performed at [facility], [address] [place], [place] [address]    Report Status 07/13/2021 FINAL  Final  MRSA Next Gen by PCR, Nasal     Status: None   Collection Time: 07/06/21 12:34 AM   Specimen: Nasal Mucosa; Nasal Swab  Result Value Ref Range Status   MRSA by PCR Next Gen NOT DETECTED NOT DETECTED Final    Comment: (NOTE) The GeneXpert MRSA Assay (FDA approved for NASAL specimens only), is one component of a comprehensive MRSA colonization surveillance program. It is not intended to diagnose MRSA infection nor to guide or monitor treatment for MRSA infections. Test performance is not FDA approved in patients less than 80 years old. Performed at [facility], [address] [place],  [address]   MRSA Next Gen by PCR, Nasal     Status: None   Collection Time: 07/06/21 10:00 AM   Specimen: Nasal Mucosa; Nasal Swab  Result Value Ref Range Status   MRSA by PCR Next Gen NOT DETECTED NOT DETECTED Final    Comment: (NOTE) The GeneXpert MRSA Assay (FDA approved for NASAL specimens only), is one component of a comprehensive MRSA colonization surveillance program. It is not intended to diagnose MRSA infection nor to guide or monitor treatment for MRSA infections. Test performance is not FDA approved in patients less than 76 years old. Performed at [facility], [phone]  [address] [place], [place] [address]          Radiology Studies: [place] EKG SITE RITE  Result Date: 07/13/2021 If [facility] image not attached, placement could not be  confirmed due to current cardiac rhythm.       Scheduled Meds:  vitamin C  500 mg Oral Daily   aspirin EC  81 mg Oral Daily   atorvastatin  40 mg Oral QHS   Chlorhexidine Gluconate Cloth  6 each Topical Daily   collagenase  1 application Topical Daily   docusate sodium  100 mg Oral BID   feeding supplement (NEPRO CARB STEADY)  237 mL Oral BID BM   Gerhardt's butt cream   Topical BID   insulin aspart  0-20 Units Subcutaneous TID WC   insulin aspart  0-5 Units Subcutaneous QHS   insulin aspart  2 Units Subcutaneous TID WC   insulin glargine-yfgn  12 Units Subcutaneous Daily   metoprolol succinate  25 mg Oral BID   multivitamin with minerals  1 tablet Oral Daily   pantoprazole  40 mg Oral QHS   sodium chloride flush  10-40 mL Intracatheter Q12H   tamsulosin  0.4 mg Oral Daily   vitamin E  400 Units Oral Daily   warfarin  2.5 mg Oral ONCE-1600   Warfarin - Pharmacist Dosing Inpatient   Does not apply q1600   Continuous Infusions:  sodium chloride 250 mL (07/13/21 0550)     LOS: 9 days    Time spent: 35 min with >50% on coc    Nolberto Hanlon, MD [facility] Pager [phone]  If 7PM-7AM, please contact night-coverage 07/14/2021, 4:13 PM

## 2021-07-15 LAB — BASIC METABOLIC PANEL
Anion gap: 6 (ref 5–15)
BUN: 31 mg/dL — ABNORMAL HIGH (ref 8–23)
CO2: 35 mmol/L — ABNORMAL HIGH (ref 22–32)
Calcium: 8.9 mg/dL (ref 8.9–10.3)
Chloride: 103 mmol/L (ref 98–111)
Creatinine, Ser: 0.77 mg/dL (ref 0.61–1.24)
GFR, Estimated: >60 mL/min (ref >60)
Glucose, Bld: 151 mg/dL — ABNORMAL HIGH (ref 70–99)
Potassium: 3.2 mmol/L — ABNORMAL LOW (ref 3.5–5.1)
Sodium: 144 mmol/L (ref 135–145)

## 2021-07-15 LAB — GLUCOSE, CAPILLARY
Glucose-Capillary: 150 mg/dL — ABNORMAL HIGH (ref 70–99)
Glucose-Capillary: 197 mg/dL — ABNORMAL HIGH (ref 70–99)

## 2021-07-15 LAB — CBC
HCT: 42.6 % (ref 39.0–52.0)
Hemoglobin: 14.2 g/dL (ref 13.0–17.0)
MCH: 33.1 pg (ref 26.0–34.0)
MCHC: 33.3 g/dL (ref 30.0–36.0)
MCV: 99.3 fL (ref 80.0–100.0)
Platelets: 232 10*3/uL (ref 150–400)
RBC: 4.29 MIL/uL (ref 4.22–5.81)
RDW: 13 % (ref 11.5–15.5)
WBC: 12.6 10*3/uL — ABNORMAL HIGH (ref 4.0–10.5)
nRBC: 0 % (ref 0.0–0.2)

## 2021-07-15 LAB — PHOSPHORUS: Phosphorus: 2.5 mg/dL (ref 2.5–4.6)

## 2021-07-15 LAB — MAGNESIUM: Magnesium: 2 mg/dL (ref 1.7–2.4)

## 2021-07-15 LAB — PROTIME-INR
INR: 2.3 — ABNORMAL HIGH (ref 0.8–1.2)
Prothrombin Time: 25.3 seconds — ABNORMAL HIGH (ref 11.4–15.2)

## 2021-07-15 MED ORDER — COLLAGENASE 250 UNIT/GM EX OINT: 1.0000 "application " | TOPICAL_OINTMENT | Freq: Every day | CUTANEOUS | 0 refills | Status: AC

## 2021-07-15 MED ORDER — GERHARDT'S BUTT CREAM: 1.0000 "application " | TOPICAL_CREAM | Freq: Two times a day (BID) | CUTANEOUS | Status: AC

## 2021-07-15 MED ORDER — POTASSIUM CHLORIDE CRYS ER 20 MEQ PO TBCR
40.0000 meq | EXTENDED_RELEASE_TABLET | ORAL | Status: AC
  Administered 2021-07-15 (×2): 40 meq via ORAL
  Filled 2021-07-15 (×2): qty 2

## 2021-07-15 MED ORDER — NEPRO/CARBSTEADY PO LIQD: 237.0000 mL | Freq: Two times a day (BID) | ORAL | 0 refills | Status: AC

## 2021-07-15 MED ORDER — WARFARIN SODIUM 2.5 MG PO TABS: 2.5000 mg | ORAL_TABLET | Freq: Once | ORAL | Status: AC

## 2021-07-15 MED ORDER — WARFARIN SODIUM 2.5 MG PO TABS
2.5000 mg | ORAL_TABLET | Freq: Once | ORAL | Status: DC
  Filled 2021-07-15: qty 1

## 2021-07-15 MED ORDER — POLYETHYLENE GLYCOL 3350 17 G PO PACK: 17.0000 g | PACK | Freq: Every day | ORAL | 0 refills | Status: AC | PRN

## 2021-07-15 MED ORDER — PANTOPRAZOLE SODIUM 40 MG PO TBEC: 40.0000 mg | DELAYED_RELEASE_TABLET | Freq: Every day | ORAL | Status: AC

## 2021-07-15 MED ORDER — ADULT MULTIVITAMIN W/MINERALS CH: 1.0000 | ORAL_TABLET | Freq: Every day | ORAL | Status: AC

## 2021-07-15 NOTE — Progress Notes (Signed)
Physical Therapy Treatment Patient Details Name: Gabriel [name]. MRN: [id number] DOB: [birth year] Today's Date: 07/15/2021    History of Present Illness 87 yo male presenting to [facility] ED via EMS after being found on the ground of his home unresponsive by police during a wellness check.  Per ED documentation and police report they were called to the patient's house having been called for a welfare check-in, apparently no one had seen him in ~4 days and he was covered in feces and insects when they found him.  Pt intubated for 1 day.    PT Comments    Pt received supine in bed. Agreeable to PT but not agreeable to attempt to sit EOB or standing despite encouragement. Pt participated in LE bed level exercise requiring intermittent AAROM for  proximal hip targeted exercises. Pt educated on importance of progressing mobility to prevent further decline. D/c recs remain appropriate.    Follow Up Recommendations  SNF     Equipment Recommendations  None recommended by PT    Recommendations for Other Services       Precautions / Restrictions Precautions Precautions: Fall Restrictions Weight Bearing Restrictions: No    Mobility  Bed Mobility               General bed mobility comments: Pt declining bed mobility/sitting EOB. Remained supine entire session.    Transfers                    Ambulation/Gait             General Gait Details: Remained in bed.   Stairs             Wheelchair Mobility    Modified Rankin (Stroke Patients Only)       Balance       Sitting balance - Comments: Remained supine in bed. Not assessed today.                                    Cognition Arousal/Alertness: Awake/alert Behavior During Therapy: WFL for tasks assessed/performed Overall Cognitive Status: No family/caregiver present to determine baseline cognitive functioning                                        Exercises  General Exercises - Lower Extremity Ankle Circles/Pumps: AROM;Both;20 reps;Strengthening;Supine Short Arc Quad: Strengthening;Both;20 reps;Supine;AROM Heel Slides: AAROM;Strengthening;Both;10 reps;Supine Hip ABduction/ADduction: AROM;Strengthening;Both;10 reps;Supine Straight Leg Raises: AAROM;Strengthening;Both;10 reps;Supine Other Exercises Other Exercises: Manual resisted leg press (hip/knee extension)    General Comments        Pertinent Vitals/Pain Pain Assessment: No/denies pain Pain Score: 0-No pain    Home Living                      Prior Function            PT Goals (current goals can now be found in the care plan section) Acute Rehab PT Goals Patient Stated Goal: to go home; pt now agreeable the more therapy is necessary before return home PT Goal Formulation: With patient Time For Goal Achievement: 07/21/21 Potential to Achieve Goals: Poor Progress towards PT goals: Not progressing toward goals - comment (Pt remains declining to attempt further mobility such as sitting EOb and standing.)    Frequency    Min 2X/week  PT Plan Current plan remains appropriate    Co-evaluation              AM-PAC PT "6 Clicks" Mobility   Outcome Measure  Help needed turning from your back to your side while in a flat bed without using bedrails?: Total Help needed moving from lying on your back to sitting on the side of a flat bed without using bedrails?: Total Help needed moving to and from a bed to a chair (including a wheelchair)?: Total Help needed standing up from a chair using your arms (e.g., wheelchair or bedside chair)?: Total Help needed to walk in hospital room?: Total Help needed climbing 3-5 steps with a railing? : Total 6 Click Score: 6    End of Session   Activity Tolerance: Patient tolerated treatment well;Patient limited by fatigue Patient left: in bed;with call bell/phone within reach;with bed alarm set Nurse Communication: Mobility  status PT Visit Diagnosis: Muscle weakness (generalized) (M62.81);Difficulty in walking, not elsewhere classified (R26.2);History of falling (Z91.81)     Time: 1037-1100 PT Time Calculation (min) (ACUTE ONLY): 23 min  Charges:  $Therapeutic Exercise: 23-37 mins                    Myking Sar M. Fairly IV, PT, DPT Physical Therapist- [place]  [facility]  07/15/2021, 12:12 PM

## 2021-07-15 NOTE — Consult Note (Signed)
PHARMACY CONSULT NOTE  Pharmacy Consult for Electrolyte Monitoring and Replacement   Recent Labs: Potassium (mmol/L)  Date Value  07/15/2021 3.2 (L)  10/28/2014 4.0   Magnesium (mg/dL)  Date Value  29/92/4268 2.0  10/26/2014 2.1   Calcium (mg/dL)  Date Value  34/19/6222 8.9   Calcium, Total (mg/dL)  Date Value  97/98/9211 8.6   Albumin (g/dL)  Date Value  94/17/4081 3.0 (L)  10/24/2014 3.7   Phosphorus (mg/dL)  Date Value  44/81/8563 2.5   Sodium (mmol/L)  Date Value  07/15/2021 144  10/28/2014 144   Assessment: Patient is an 87 y/o M with medical history including history of CVA, diabetes, HLD, HTN, history of cerebral arterial thrombosis who was admitted after being found down, unresponsive after wellness check. Patient was intubated briefly from 7/25 - 7/26. Pharmacy consulted to assist with electrolyte monitoring and replacement as indicated.    Goal of Therapy:  Electrolytes within normal limits  Plan:  K 3.2, will give PO Kcl 40 mEq x2 doses IV lasix 40 mg BID stopped 8/3 All other electrolytes remain WNL. Follow up electrolytes with AM labs  Raiford Noble, PharmD 07/15/2021 7:12 AM

## 2021-07-15 NOTE — TOC Transition Note (Signed)
Transition of Care [facility]) - CM/SW Discharge Note   Patient Details  Name: Gabriel [name]. MRN: [id number] Date of Birth: [birth year]  Transition of Care [facility]) CM/SW Contact:  Gildardo Griffes, LCSW Phone Number: 07/15/2021, 1:59 PM   Clinical Narrative:     Patient will DC to: [facility] Anticipated DC date: 07/15/21 Family notified: lvm with brother Chief Operating Officer byWendie Simmer  Per MD patient ready for DC to [facility] . RN, patient, patient's family, and facility notified of DC. Discharge Summary sent to facility. RN given number for report   [phone]. DC packet on chart. Ambulance transport requested for patient.  CSW signing off.  [place], [place] [phone]   Final next level of care: Skilled Nursing Facility Barriers to Discharge: No Barriers Identified   Patient Goals and CMS Choice Patient states their goals for this hospitalization and ongoing recovery are:: to snf CMS Medicare.gov Compare Post Acute Care list provided to:: Patient Choice offered to / list presented to : Patient  Discharge Placement              Patient chooses bed at: [facility] Patient to be transferred to facility by: [facility] Name of family member notified: Chanetta Marshall (brother) Patient and family notified of of transfer: 07/15/21  Discharge Plan and Services     Post Acute Care Choice: Skilled Nursing Facility                               Social Determinants of Health (SDOH) Interventions     Readmission Risk Interventions No flowsheet data found.

## 2021-07-15 NOTE — Progress Notes (Addendum)
Brother Rosanne Ashing, [place] at bedside and upset about pt discharge, stated he wasn't contacted about discharge. MD and TOC made aware via secure chat. No answer when TOC attempted to call and voicemail left with direct number for TOC. MD attempted to call x1, no answer as well/voicemail left.

## 2021-07-15 NOTE — TOC Progression Note (Signed)
Transition of Care (TOC) - Progression Note    Patient Details  Name: Gabriel [name]. MRN: [id number] Date of Birth: 10/14/1932  Transition of Care [facility]) CM/SW Contact  Gildardo Griffes, [place] Phone Number: 07/15/2021, 3:34 PM  Clinical Narrative:     Patient's brother Chanetta Marshall called CSW back and all questions and concerns answered.   Expected Discharge Plan: Skilled Nursing Facility Barriers to Discharge: No Barriers Identified  Expected Discharge Plan and Services Expected Discharge Plan: Skilled Nursing Facility     Post Acute Care Choice: Skilled Nursing Facility Living arrangements for the past 2 months: Single Family Home Expected Discharge Date: 07/15/21                                     Social Determinants of Health (SDOH) Interventions    Readmission Risk Interventions No flowsheet data found.

## 2021-07-15 NOTE — Discharge Summary (Addendum)
Gabriel [name]. NOM:[id number] DOB: 1931/12/15 DOA: 07/05/2021  PCP: Valera Castle, MD  Admit date: 07/05/2021 Discharge date: 07/15/2021  Admitted From: Home Disposition: SNF  Recommendations for Outpatient Follow-up:  Follow up with PCP in 1 week Please obtain BMP/CBC in one week      Discharge Condition:Stable CODE STATUS: Full Diet recommendation: Heart Healthy / Carb Modified -dysphagia 2  Brief/Interim Summary: Per HPI:87 yo male presenting to [facility] ED via EMS after being found on the ground of his home unresponsive by police during a wellness check.  Per ED documentation and police report they were called to the patient's house for a welfare check possibly by a neighbor, and that no one had seen him for roughly 4 days.  When police arrived they found the patient down on the ground in the kitchen covered in feces and insects. ED course:  Initial vitals: T-100.2, tachypneic at 36, tachycardic at 125, BP stable at 114/52 & SPO2 97% with bag-valve-mask support Significant labs: Hyperkalemia at 5.1, acute renal failure BUN/Cr-133/5.10, AST elevated at 78, total bilirubin elevated at 1.8, CK indicative of rhabdomyolysis at 3225, troponin elevated at 201> 229, lactic acidosis: 2.8> 3.6, mild leukocytosis with left shift at 13.7, hemoglobin elevated at 17.6.  CTh negative for acute intracranial abnormality.  CT cervical spine negative for fracture.  CXR reveals LLL atelectasis versus developing infiltrate.  UA not consistent with UTI.   Patient intubated emergently and placed on mechanical ventilation due to inability to protect airway in unresponsive state.  Sepsis protocol initiated and patient received 3.5 L of IV fluid resuscitation and empiric antibiotic coverage. Critical care initially was consulted for admission since patient was emergently intubated.  Once he was stable from the ICU he was transferred to the hospitalist team.  Patient eventually was extubated.  Was found with  hyponatremia and was treated for this which resolved.  He was also treated with IV antibiotics for aspiration pneumonia.  He was found with pleural effusion and was started on IV diuretics.   Sepsis: met criteria w/ leukocytosis, fever, tachycardia, elevated lactic acid & aspiration pneumonia. Blood cxs NGTD. Urine cx no growth.  Treated with IV antibiotics which he completed course    Aspiration pneumonia:  Completed unasyn course Off O2 on room air satting 94%.  Continue incentive spirometer    B/l pleural effusions & fluid overload: R>L as per CXR.  On room air Was treated with IV Lasix Clinically improved     Hypernatremia:  Improved with treatment   Hypokalemia-  Replaced prior to discharge Will need to check labs within 3 days   hypophosphatemia:  WNL   AKI: resolved   Rhabdomyolysis: resolved   Acute metabolic encephalopathy: mental status is labile. Likely secondary to all above. Hx of CVA, cerebral arterial thrombosis, on warfarin. Pharmacy to monitor & dose warfarin   Elevated troponin: now with ST Elevation 07/27. Echo 07/26 EF 65 to 70% with moderate asymmetric left ventricular hypertrophy of the septal segment; mildly increased right ventricular wall thickness, RBBB, bi-atrial enlargement & borderline LAFB noted on 12 lead. Favor demand ischemia as per ICU   DM2: HbA1c 8.8, uncontrolled. Continue on glargine, SSI w/ accuchecks   Dysphagia: continue on dysphagia II diet as per speech   HLD: continue on statin   HTN: continue on metoprolol   Sacral Pressure injury & scattered abrasions: present on admission. ?stage 4.  Apply Santyl to sacrum  day, then cover with moist gauze and foam dressing. (Change foam  dressing q3 days or prn soiling)     Discharge Diagnoses:  Active Problems:   Acute respiratory failure (HCC)   Elevated CK   On mechanically assisted ventilation (HCC)   Uncontrolled type 2 diabetes mellitus ([place])   Acute metabolic encephalopathy    Acute kidney injury ([place])   Pressure injury of skin of sacral region    Discharge Instructions  Discharge Instructions     Diet - low sodium heart healthy   Complete by: As directed    Discharge wound care:   Complete by: As directed    Needs sacral wound cleaned dalily as above   Increase activity slowly   Complete by: As directed       Allergies as of 07/15/2021       Reactions   Darvon [propoxyphene] Hives   Glipizide Other (See Comments)   Saxagliptin Other (See Comments)        Medication List     STOP taking these medications    hydrochlorothiazide 25 MG tablet Commonly known as: HYDRODIURIL   Januvia 100 MG tablet Generic drug: sitaGLIPtin   lisinopril 20 MG tablet Commonly known as: ZESTRIL   oxybutynin 5 MG tablet Commonly known as: DITROPAN       TAKE these medications    acetaminophen 500 MG tablet Commonly known as: TYLENOL Take 500 mg by mouth every 6 (six) hours as needed.   aspirin EC 81 MG tablet Take 81 mg by mouth daily.   atorvastatin 40 MG tablet Commonly known as: LIPITOR Take 40 mg by mouth at bedtime.   collagenase ointment Commonly known as: SANTYL Apply 1 application topically daily. Start taking on: July 16, 2021   empagliflozin 10 MG Tabs tablet Commonly known as: JARDIANCE Take 10 mg by mouth daily.   feeding supplement (NEPRO CARB STEADY) Liqd Take 237 mLs by mouth 2 (two) times daily between meals.   Gerhardt's butt cream Crea Apply 1 application topically 2 (two) times daily.   glipiZIDE 10 MG 24 hr tablet Commonly known as: GLUCOTROL XL Take 10 mg by mouth daily. What changed: Another medication with the same name was removed. Continue taking this medication, and follow the directions you see here.   metFORMIN 1000 MG tablet Commonly known as: GLUCOPHAGE Take 1,000 mg by mouth 2 (two) times daily.   metoprolol succinate 25 MG 24 hr tablet Commonly known as: TOPROL-XL Take 25 mg by mouth 2 (two)  times daily.   multivitamin with minerals Tabs tablet Take 1 tablet by mouth daily. Start taking on: July 16, 2021   pantoprazole 40 MG tablet Commonly known as: PROTONIX Take 1 tablet (40 mg total) by mouth at bedtime.   polyethylene glycol 17 g packet Commonly known as: MIRALAX / GLYCOLAX Take 17 g by mouth daily as needed for moderate constipation.   tamsulosin 0.4 MG Caps capsule Commonly known as: Flomax Take 1 capsule (0.4 mg total) by mouth daily.   vitamin C 500 MG tablet Commonly known as: ASCORBIC ACID Take 500 mg by mouth daily.   vitamin E 180 MG (400 UNITS) capsule Take 400 Units by mouth daily.   warfarin 2.5 MG tablet Commonly known as: COUMADIN Take 1 tablet (2.5 mg total) by mouth one time only at 4 PM. What changed:  medication strength how much to take when to take this               Discharge Care Instructions  (From admission, onward)  Start     Ordered   07/15/21 0000  Discharge wound care:       Comments: Needs sacral wound cleaned dalily as above   07/15/21 1348            Follow-up Information     Olmedo, Guy Begin, MD Follow up in 1 week(s).   Specialty: Family Medicine Contact information: [place] [address] [phone]                Allergies  Allergen Reactions   Darvon [Propoxyphene] Hives   Glipizide Other (See Comments)   Saxagliptin Other (See Comments)    Consultations: pccm   Procedures/Studies: DG Abdomen 1 View  Result Date: 07/05/2021 CLINICAL DATA:  87 year old male status post NG placement EXAM: ABDOMEN - 1 VIEW COMPARISON:  Chest radiograph dated 07/05/2021. FINDINGS: The tip of the enteric tube is at the level of the GE junction. Recommend further advancing by additional 13 cm. IMPRESSION: Enteric tube at the GE junction. Recommend further advancing by additional 13 cm. Electronically Signed   By: Anner Crete M.D.   On: 07/05/2021 20:43   CT  Head Wo Contrast  Result Date: 07/05/2021 CLINICAL DATA:  Fall, found unresponsive EXAM: CT HEAD WITHOUT CONTRAST CT CERVICAL SPINE WITHOUT CONTRAST TECHNIQUE: Multidetector CT imaging of the head and cervical spine was performed following the standard protocol without intravenous contrast. Multiplanar CT image reconstructions of the cervical spine were also generated. COMPARISON:  09/23/2008 FINDINGS: CT HEAD FINDINGS Brain: No evidence of acute infarction, hemorrhage, hydrocephalus, extra-axial collection or mass lesion/mass effect. Mild periventricular and deep white matter hypodensity. Small foci of bilateral occipital encephalomalacia unchanged (series 4, image 16) mild global cerebral volume loss. Vascular: No hyperdense vessel or unexpected calcification. Skull: Normal. Negative for fracture or focal lesion. Sinuses/Orbits: No acute finding. Other: None. CT CERVICAL SPINE FINDINGS Alignment: Normal. Skull base and vertebrae: No acute fracture. No primary bone lesion or focal pathologic process. Soft tissues and spinal canal: No prevertebral fluid or swelling. No visible canal hematoma. Disc levels: Moderate multilevel disc space height loss and osteophytosis. Upper chest: Negative. Other: Esophagogastric tube is coiled in the oropharynx. IMPRESSION: 1. No acute intracranial pathology. 2. Small-vessel white matter disease and small foci of bilateral occipital encephalomalacia, unchanged from prior. 3. No fracture or static subluxation of the cervical spine. 4. Esophagogastric tube is coiled in the oropharynx. Correlate with chest/abdominal radiograph for appropriate positioning of tip and side port below the diaphragm. Electronically Signed   By: Eddie Candle M.D.   On: 07/05/2021 21:06   CT Cervical Spine Wo Contrast  Result Date: 07/05/2021 CLINICAL DATA:  Fall, found unresponsive EXAM: CT HEAD WITHOUT CONTRAST CT CERVICAL SPINE WITHOUT CONTRAST TECHNIQUE: Multidetector CT imaging of the head and  cervical spine was performed following the standard protocol without intravenous contrast. Multiplanar CT image reconstructions of the cervical spine were also generated. COMPARISON:  09/23/2008 FINDINGS: CT HEAD FINDINGS Brain: No evidence of acute infarction, hemorrhage, hydrocephalus, extra-axial collection or mass lesion/mass effect. Mild periventricular and deep white matter hypodensity. Small foci of bilateral occipital encephalomalacia unchanged (series 4, image 16) mild global cerebral volume loss. Vascular: No hyperdense vessel or unexpected calcification. Skull: Normal. Negative for fracture or focal lesion. Sinuses/Orbits: No acute finding. Other: None. CT CERVICAL SPINE FINDINGS Alignment: Normal. Skull base and vertebrae: No acute fracture. No primary bone lesion or focal pathologic process. Soft tissues and spinal canal: No prevertebral fluid or swelling. No visible canal hematoma. Disc  levels: Moderate multilevel disc space height loss and osteophytosis. Upper chest: Negative. Other: Esophagogastric tube is coiled in the oropharynx. IMPRESSION: 1. No acute intracranial pathology. 2. Small-vessel white matter disease and small foci of bilateral occipital encephalomalacia, unchanged from prior. 3. No fracture or static subluxation of the cervical spine. 4. Esophagogastric tube is coiled in the oropharynx. Correlate with chest/abdominal radiograph for appropriate positioning of tip and side port below the diaphragm. Electronically Signed   By: Eddie Candle M.D.   On: 07/05/2021 21:06   US RENAL  Result Date: 07/06/2021 CLINICAL DATA:  Acute kidney injury. EXAM: RENAL / URINARY TRACT ULTRASOUND COMPLETE COMPARISON:  CT abdomen 10/27/2015. FINDINGS: Right Kidney: Renal measurements: 13.0 x 6.8 x 5.8 cm = volume: 269 mL. Normal echogenicity. Negative for hydronephrosis. Renal cysts. Evidence for adjacent cysts in the posterior right kidney, largest measuring up to 3.9 cm. One cyst has a few septations.  There is an additional simple anechoic cyst in the right kidney measuring up to 3.5 cm. Left Kidney: Renal measurements: 13.2 x 7.3 x 6.2 = volume: 315 mL. Normal echogenicity. Negative for hydronephrosis. Overall, limited evaluation. Anechoic cyst in the superior left kidney that measures up to 2.7 cm. Bladder: 1.2 cm calcification with posterior acoustic shadowing in the bladder. Other: None. IMPRESSION: 1. Negative for hydronephrosis. 2. Large calcification in the bladder measuring up to 1.2 cm. 3. Limited evaluation of the left kidney. 4. Bilateral renal cysts. At least one right renal cyst is complex with septations. Electronically Signed   By: Markus Daft M.D.   On: 07/06/2021 09:08   DG Chest Port 1 View  Result Date: 07/12/2021 CLINICAL DATA:  Shortness of breath EXAM: PORTABLE CHEST 1 VIEW COMPARISON:  07/06/2021 FINDINGS: Interval removal of NG tube and endotracheal tube. Bilateral lower lobe airspace opacities, right greater than left, worsening since prior study. Heart is normal size. Small bilateral effusions. No pneumothorax. IMPRESSION: Worsening bilateral lower lobe airspace disease and effusions, right greater than left. Electronically Signed   By: Rolm Baptise M.D.   On: 07/12/2021 11:17   DG Chest Port 1 View  Result Date: 07/06/2021 CLINICAL DATA:  87 year old male intubated. Respiratory failure, acute kidney injury. EXAM: PORTABLE CHEST 1 VIEW COMPARISON:  Portable chest 07/05/2021 and earlier. FINDINGS: Portable AP upright view at 0634 hours. Endotracheal tube tip in good position between the level the clavicles and carina. Enteric tube tip is at the lower thoracic esophagus, side hole is at the level of the carina. The patient is mildly rotated to the left now. Stable cardiac size and mediastinal contours. Right lung ventilation appears stable but there is new patchy left lung base opacity. No pneumothorax or pulmonary edema. Stable visualized osseous structures. IMPRESSION: 1.  Endotracheal tube tip in good position. But enteric tube side hole at the level of the carina in the mid esophagus. Advance to ensure side hole placement inside the stomach. 2. Patchy increased left lung base opacity is nonspecific and might reflect atelectasis or infection. Electronically Signed   By: Genevie Ann M.D.   On: 07/06/2021 06:50   DG Chest Portable 1 View  Result Date: 07/05/2021 CLINICAL DATA:  Status post intubation. EXAM: PORTABLE CHEST 1 VIEW COMPARISON:  July 05, 2021 (8:27 p.m.) FINDINGS: An endotracheal tube is seen with its distal tip approximately 8.0 cm from the carina. A nasogastric tube is noted with its distal tip overlying the expected region of the gastroesophageal junction. Mild atelectasis is seen within the left lung base. There  is no evidence of a pleural effusion or pneumothorax. The heart size and mediastinal contours are within normal limits. Radiopaque surgical clips are seen overlying the right upper quadrant. The visualized skeletal structures are unremarkable. IMPRESSION: Interval nasogastric tube positioning, as described above. Further advancement of the NG tube by approximately 7 cm is recommended to decrease the risk of aspiration. Electronically Signed   By: Virgina Norfolk M.D.   On: 07/05/2021 21:26   DG Chest Portable 1 View  Result Date: 07/05/2021 CLINICAL DATA:  87 year old male status post intubation. EXAM: PORTABLE CHEST 1 VIEW COMPARISON:  Chest radiograph dated 09/23/2008. FINDINGS: Endotracheal tube with tip approximately 2.5 cm above the carina. Recommend retraction by 3 cm for optimal positioning. Enteric tube with side-port in the distal esophagus and tip in the region of the GE junction. Recommend further advancing by additional 13 cm. Probable small left pleural effusion and left lung base atelectasis versus infiltrate. No pneumothorax. The cardiac silhouette is within limits. Bilateral hilar prominence may represent pulmonary hypertension. No acute  osseous pathology. IMPRESSION: 1. Endotracheal tube above the carina. Recommend retraction by 3 cm for optimal positioning. 2. Enteric tube with tip in the region of the GE junction. Recommend further advancing by additional 13 cm. Electronically Signed   By: Anner Crete M.D.   On: 07/05/2021 20:42   ECHOCARDIOGRAM COMPLETE  Result Date: 07/06/2021    ECHOCARDIOGRAM REPORT   Patient Name:   Luman Holway. Date of Exam: 07/06/2021 Medical Rec #:  [id number]          Height:       74.0 in Accession #:    [id number]         Weight:       201.9 lb Date of Birth:  01/15/1932          BSA:          2.182 m Patient Age:    82 years           BP:           127/54 mmHg Patient Gender: M                  HR:           99 bpm. Exam Location:  [facility] Procedure: 2D Echo, Cardiac Doppler and Color Doppler Indications:     Cardiomyopathy--unspecified I42.9  History:         Patient has no prior history of Echocardiogram examinations.                  CHF; Risk Factors:Hypertension and Diabetes.  Sonographer:     Sherrie Sport RDCS (AE) Referring Phys:  [id number] [place] L [place] Diagnosing Phys: Yolonda Kida MD  Sonographer Comments: Technically difficult study due to poor echo windows, echo performed with patient supine and on artificial respirator, no apical window and suboptimal parasternal window. IMPRESSIONS  1. Left ventricular ejection fraction, by estimation, is 65 to 70%. The left ventricle has normal function. The left ventricle has no regional wall motion abnormalities. There is moderate asymmetric left ventricular hypertrophy of the septal segment. Left ventricular diastolic function could not be evaluated.  2. Right ventricular systolic function is low normal. The right ventricular size is mildly enlarged. Mildly increased right ventricular wall thickness.  3. The mitral valve is normal in structure. No evidence of mitral valve regurgitation.  4. The aortic valve is normal in structure. Aortic valve  regurgitation is not visualized. No  aortic stenosis is present. Conclusion(s)/Recommendation(s): Poor windows for evaluation of left ventricular function by transthoracic echocardiography. Would recommend an alternative means of evaluation. FINDINGS  Left Ventricle: Left ventricular ejection fraction, by estimation, is 65 to 70%. The left ventricle has normal function. The left ventricle has no regional wall motion abnormalities. The left ventricular internal cavity size was normal in size. There is  moderate asymmetric left ventricular hypertrophy of the septal segment. Left ventricular diastolic function could not be evaluated. Right Ventricle: The right ventricular size is mildly enlarged. Mildly increased right ventricular wall thickness. Right ventricular systolic function is low normal. Left Atrium: Left atrial size was normal in size. Right Atrium: Right atrial size was normal in size. Pericardium: There is no evidence of pericardial effusion. Mitral Valve: The mitral valve is normal in structure. No evidence of mitral valve regurgitation. Tricuspid Valve: The tricuspid valve is normal in structure. Tricuspid valve regurgitation is trivial. Aortic Valve: The aortic valve is normal in structure. Aortic valve regurgitation is not visualized. No aortic stenosis is present. Pulmonic Valve: The pulmonic valve was grossly normal. Pulmonic valve regurgitation is not visualized. Aorta: The ascending aorta was not well visualized. IAS/Shunts: No atrial level shunt detected by color flow Doppler.  LEFT VENTRICLE PLAX 2D LVIDd:         3.54 cm LVIDs:         2.25 cm LV PW:         1.17 cm LV IVS:        1.72 cm  LEFT ATRIUM         Index LA diam:    3.10 cm 1.42 cm/m                        PULMONIC VALVE AORTA                 PV Vmax:        1.01 m/s Ao Root diam: 3.10 cm PV Peak grad:   4.1 mmHg                       RVOT Peak grad: 8 mmHg  TRICUSPID VALVE TR Peak grad:   14.3 mmHg TR Vmax:        189.00 cm/s Yolonda Kida MD Electronically signed by Yolonda Kida MD Signature Date/Time: 07/06/2021/1:36:28 PM    Final    [place] EKG SITE RITE  Result Date: 07/13/2021 If Site Rite image not attached, placement could not be confirmed due to current cardiac rhythm.     Subjective: No sob, feeling better  Discharge Exam: Vitals:   07/15/21 0753 07/15/21 1134  BP: 124/63 109/62  Pulse: 92 85  Resp: (!) 24 17  Temp: 97.9 F (36.6 C) 98.1 F (36.7 C)  SpO2: 92% 94%   Vitals:   07/14/21 2149 07/15/21 0506 07/15/21 0753 07/15/21 1134  BP: (!) 117/48 (!) 122/53 124/63 109/62  Pulse: 91 97 92 85  Resp: 18 18 (!) 24 17  Temp: 98.3 F (36.8 C) (!) 97.5 F (36.4 C) 97.9 F (36.6 C) 98.1 F (36.7 C)  TempSrc: Oral Oral Oral   SpO2: (!) 89% 93% 92% 94%  Weight:      Height:        General: Pt is alert, awake, not in acute distress Cardiovascular: RRR, S1/S2 +, no rubs, no gallops Respiratory: CTA bilaterally, no wheezing, no rhonchi Abdominal: Soft, NT, ND, bowel sounds +  Extremities: generalized edema    The results of significant diagnostics from this hospitalization (including imaging, microbiology, ancillary and laboratory) are listed below for reference.     Microbiology: Recent Results (from the past 240 hour(s))  Resp Panel by RT-PCR (Flu A&B, Covid) Nasopharyngeal Swab     Status: None   Collection Time: 07/05/21  8:22 PM   Specimen: Nasopharyngeal Swab; Nasopharyngeal(NP) swabs in vial transport medium  Result Value Ref Range Status   SARS Coronavirus 2 by RT PCR NEGATIVE NEGATIVE Final    Comment: (NOTE) SARS-CoV-2 target nucleic acids are NOT DETECTED.  The SARS-CoV-2 RNA is generally detectable in upper respiratory specimens during the acute phase of infection. The lowest concentration of SARS-CoV-2 viral copies this assay can detect is 138 copies/mL. A negative result does not preclude SARS-Cov-2 infection and should not be used as the sole basis for treatment or other  patient management decisions. A negative result may occur with  improper specimen collection/handling, submission of specimen other than nasopharyngeal swab, presence of viral mutation(s) within the areas targeted by this assay, and inadequate number of viral copies(<138 copies/mL). A negative result must be combined with clinical observations, patient history, and epidemiological information. The expected result is Negative.  Fact Sheet for Patients:  EntrepreneurPulse.com.au  Fact Sheet for Healthcare Providers:  IncredibleEmployment.be  This test is no t yet approved or cleared by the [place] FDA and  has been authorized for detection and/or diagnosis of SARS-CoV-2 by FDA under an Emergency Use Authorization (EUA). This EUA will remain  in effect (meaning this test can be used) for the duration of the COVID-19 declaration under Section 564(b)(1) of the Act, 21 U.S.C.section 360bbb-3(b)(1), unless the authorization is terminated  or revoked sooner.       Influenza A by PCR NEGATIVE NEGATIVE Final   Influenza B by PCR NEGATIVE NEGATIVE Final    Comment: (NOTE) The Xpert Xpress SARS-CoV-2/FLU/RSV plus assay is intended as an aid in the diagnosis of influenza from Nasopharyngeal swab specimens and should not be used as a sole basis for treatment. Nasal washings and aspirates are unacceptable for Xpert Xpress SARS-CoV-2/FLU/RSV testing.  Fact Sheet for Patients: EntrepreneurPulse.com.au  Fact Sheet for Healthcare Providers: IncredibleEmployment.be  This test is not yet approved or cleared by the [place] FDA and has been authorized for detection and/or diagnosis of SARS-CoV-2 by FDA under an Emergency Use Authorization (EUA). This EUA will remain in effect (meaning this test can be used) for the duration of the COVID-19 declaration under Section 564(b)(1) of the Act, 21 U.S.C. section  360bbb-3(b)(1), unless the authorization is terminated or revoked.  Performed at [facility], [address] [place], [place] [address]   Blood Culture (routine x 2)     Status: None   Collection Time: 07/05/21  8:33 PM   Specimen: BLOOD  Result Value Ref Range Status   Specimen Description BLOOD RIGHT ANTECUBITAL  Final   Special Requests   Final    BOTTLES DRAWN AEROBIC AND ANAEROBIC Blood Culture results may not be optimal due to an inadequate volume of blood received in culture bottles   Culture   Final    NO GROWTH 8 DAYS Performed at [facility], [address] [place], [place] [address]    Report Status 07/13/2021 FINAL  Final  Urine Culture     Status: None   Collection Time: 07/05/21  8:33 PM   Specimen: Urine, Random  Result Value Ref Range Status   Specimen  Description   Final    URINE, RANDOM Performed at [facility], [address], [place], [place] [address]    Special Requests   Final    NONE Performed at [facility] [facility], [address], [place], [place] [address]    Culture   Final    NO GROWTH Performed at [facility], [place] [address] [place], [place] [address]    Report Status 07/07/2021 FINAL  Final  Blood Culture (routine x 2)     Status: None   Collection Time: 07/05/21  8:38 PM   Specimen: BLOOD  Result Value Ref Range Status   Specimen Description BLOOD BLOOD RIGHT FOREARM  Final   Special Requests   Final    BOTTLES DRAWN AEROBIC AND ANAEROBIC Blood Culture adequate volume   Culture   Final    NO GROWTH 8 DAYS Performed at [facility], [address] [place], [place] [address]    Report Status 07/13/2021 FINAL  Final  MRSA Next Gen by PCR, Nasal     Status: None   Collection Time: 07/06/21 12:34 AM   Specimen: Nasal Mucosa; Nasal Swab  Result Value Ref Range Status   MRSA by PCR Next Gen NOT DETECTED NOT DETECTED Final    Comment: (NOTE) The GeneXpert MRSA Assay (FDA approved for  NASAL specimens only), is one component of a comprehensive MRSA colonization surveillance program. It is not intended to diagnose MRSA infection nor to guide or monitor treatment for MRSA infections. Test performance is not FDA approved in patients less than 52 years old. Performed at [facility], [address] [place], [place] [address]   MRSA Next Gen by PCR, Nasal     Status: None   Collection Time: 07/06/21 10:00 AM   Specimen: Nasal Mucosa; Nasal Swab  Result Value Ref Range Status   MRSA by PCR Next Gen NOT DETECTED NOT DETECTED Final    Comment: (NOTE) The GeneXpert MRSA Assay (FDA approved for NASAL specimens only), is one component of a comprehensive MRSA colonization surveillance program. It is not intended to diagnose MRSA infection nor to guide or monitor treatment for MRSA infections. Test performance is not FDA approved in patients less than 47 years old. Performed at [facility], [address] [place], [place] [address]      Labs: BNP (last 3 results) Recent Labs    07/12/21 1630  BNP 786.7*   Basic Metabolic Panel: Recent Labs  Lab 07/11/21 0739 07/12/21 1630 07/13/21 1109 07/14/21 0536 07/15/21 0550  NA 145 147* 145 142 144  K 3.5 3.4* 3.5 3.0* 3.2*  CL 106 107 106 102 103  CO2 30 34* 32 35* 35*  GLUCOSE 224* 201* 190* 138* 151*  BUN 25* 27* 27* 27* 31*  CREATININE 0.84 0.87 0.78 0.74 0.77  CALCIUM 9.2 9.0 9.1 8.9 8.9  MG 2.0 2.2 2.0 2.1 2.0  PHOS 2.9 3.0 3.3 3.5 2.5   Liver Function Tests: No results for input(s): AST, ALT, ALKPHOS, BILITOT, PROT, ALBUMIN in the last 168 hours. No results for input(s): LIPASE, AMYLASE in the last 168 hours. No results for input(s): AMMONIA in the last 168 hours. CBC: Recent Labs  Lab 07/11/21 0739 07/12/21 1630 07/13/21 1109 07/14/21 0536 07/15/21 0550  WBC 13.2* 15.0* 17.5* 15.9* 12.6*  NEUTROABS  --  12.9*  --   --   --   HGB 16.0 15.2 16.3 15.2 14.2  HCT 47.8 45.9 48.2  44.8 42.6  MCV 100.2* 101.3* 101.9* 101.4* 99.3  PLT 210 213 205 221 232   Cardiac Enzymes: Recent Labs  Lab 07/08/21 1849 07/09/21 0308 07/09/21 0923 07/10/21 0541  CKTOTAL 656* 550* 455* 288   BNP: Invalid input(s): POCBNP CBG: Recent Labs  Lab 07/14/21 1142 07/14/21 1635 07/14/21 2201 07/15/21 0755 07/15/21 1136  GLUCAP 177* 155* 167* 150* 197*   D-Dimer No results for input(s): DDIMER in the last 72 hours. Hgb A1c No results for input(s): HGBA1C in the last 72 hours. Lipid Profile No results for input(s): CHOL, HDL, LDLCALC, TRIG, CHOLHDL, LDLDIRECT in the last 72 hours. Thyroid function studies No results for input(s): TSH, T4TOTAL, T3FREE, THYROIDAB in the last 72 hours.  Invalid input(s): FREET3 Anemia work up No results for input(s): VITAMINB12, FOLATE, FERRITIN, TIBC, IRON, RETICCTPCT in the last 72 hours. Urinalysis    Component Value Date/Time   COLORURINE AMBER (A) 07/05/2021 2033   APPEARANCEUR HAZY (A) 07/05/2021 2033   APPEARANCEUR Cloudy (A) 11/16/2015 1512   LABSPEC 1.018 07/05/2021 2033   PHURINE 5.0 07/05/2021 2033   GLUCOSEU >=500 (A) 07/05/2021 2033   HGBUR NEGATIVE 07/05/2021 2033   BILIRUBINUR NEGATIVE 07/05/2021 2033   BILIRUBINUR Negative 11/16/2015 [place] 07/05/2021 2033   PROTEINUR 30 (A) 07/05/2021 2033   NITRITE NEGATIVE 07/05/2021 2033   LEUKOCYTESUR NEGATIVE 07/05/2021 2033   Sepsis Labs Invalid input(s): PROCALCITONIN,  WBC,  LACTICIDVEN Microbiology Recent Results (from the past 240 hour(s))  Resp Panel by RT-PCR (Flu A&B, Covid) Nasopharyngeal Swab     Status: None   Collection Time: 07/05/21  8:22 PM   Specimen: Nasopharyngeal Swab; Nasopharyngeal(NP) swabs in vial transport medium  Result Value Ref Range Status   SARS Coronavirus 2 by RT PCR NEGATIVE NEGATIVE Final    Comment: (NOTE) SARS-CoV-2 target nucleic acids are NOT DETECTED.  The SARS-CoV-2 RNA is generally detectable in upper  respiratory specimens during the acute phase of infection. The lowest concentration of SARS-CoV-2 viral copies this assay can detect is 138 copies/mL. A negative result does not preclude SARS-Cov-2 infection and should not be used as the sole basis for treatment or other patient management decisions. A negative result may occur with  improper specimen collection/handling, submission of specimen other than nasopharyngeal swab, presence of viral mutation(s) within the areas targeted by this assay, and inadequate number of viral copies(<138 copies/mL). A negative result must be combined with clinical observations, patient history, and epidemiological information. The expected result is Negative.  Fact Sheet for Patients:  EntrepreneurPulse.com.au  Fact Sheet for Healthcare Providers:  IncredibleEmployment.be  This test is no t yet approved or cleared by the [place] FDA and  has been authorized for detection and/or diagnosis of SARS-CoV-2 by FDA under an Emergency Use Authorization (EUA). This EUA will remain  in effect (meaning this test can be used) for the duration of the COVID-19 declaration under Section 564(b)(1) of the Act, 21 U.S.C.section 360bbb-3(b)(1), unless the authorization is terminated  or revoked sooner.       Influenza A by PCR NEGATIVE NEGATIVE Final   Influenza B by PCR NEGATIVE NEGATIVE Final    Comment: (NOTE) The Xpert Xpress SARS-CoV-2/FLU/RSV plus assay is intended as an aid in the diagnosis of influenza from Nasopharyngeal swab specimens and should not be used as a sole basis for treatment. Nasal washings and aspirates are unacceptable for Xpert Xpress SARS-CoV-2/FLU/RSV testing.  Fact Sheet for Patients: EntrepreneurPulse.com.au  Fact Sheet for Healthcare Providers: IncredibleEmployment.be  This test is not  yet approved or cleared by the [place] and has been  authorized for detection and/or diagnosis of SARS-CoV-2 by FDA under an Emergency Use Authorization (EUA). This EUA will remain in effect (meaning this test can be used) for the duration of the COVID-19 declaration under Section 564(b)(1) of the Act, 21 U.S.C. section 360bbb-3(b)(1), unless the authorization is terminated or revoked.  Performed at [facility], [address] [place], [place] [address]   Blood Culture (routine x 2)     Status: None   Collection Time: 07/05/21  8:33 PM   Specimen: BLOOD  Result Value Ref Range Status   Specimen Description BLOOD RIGHT ANTECUBITAL  Final   Special Requests   Final    BOTTLES DRAWN AEROBIC AND ANAEROBIC Blood Culture results may not be optimal due to an inadequate volume of blood received in culture bottles   Culture   Final    NO GROWTH 8 DAYS Performed at [facility], [address] [place], [place] [address]    Report Status 07/13/2021 FINAL  Final  Urine Culture     Status: None   Collection Time: 07/05/21  8:33 PM   Specimen: Urine, Random  Result Value Ref Range Status   Specimen Description   Final    URINE, RANDOM Performed at [facility], [address], [place], [place] [address]    Special Requests   Final    NONE Performed at [facility], [address] [place], [place] [address]    Culture   Final    NO GROWTH Performed at [facility], [place] [address] [place], [place] [address]    Report Status 07/07/2021 FINAL  Final  Blood Culture (routine x 2)     Status: None   Collection Time: 07/05/21  8:38 PM   Specimen: BLOOD  Result Value Ref Range Status   Specimen Description BLOOD BLOOD RIGHT FOREARM  Final   Special Requests   Final    BOTTLES DRAWN AEROBIC AND ANAEROBIC Blood Culture adequate volume   Culture   Final    NO GROWTH 8 DAYS Performed at [facility], [address] [place], [place] [address]    Report Status 07/13/2021 FINAL  Final   MRSA Next Gen by PCR, Nasal     Status: None   Collection Time: 07/06/21 12:34 AM   Specimen: Nasal Mucosa; Nasal Swab  Result Value Ref Range Status   MRSA by PCR Next Gen NOT DETECTED NOT DETECTED Final    Comment: (NOTE) The GeneXpert MRSA Assay (FDA approved for NASAL specimens only), is one component of a comprehensive MRSA colonization surveillance program. It is not intended to diagnose MRSA infection nor to guide or monitor treatment for MRSA infections. Test performance is not FDA approved in patients less than 36 years old. Performed at [facility], [address] [place], [place] [address]   MRSA Next Gen by PCR, Nasal     Status: None   Collection Time: 07/06/21 10:00 AM   Specimen: Nasal Mucosa; Nasal Swab  Result Value Ref Range Status   MRSA by PCR Next Gen NOT DETECTED NOT DETECTED Final    Comment: (NOTE) The GeneXpert MRSA Assay (FDA approved for NASAL specimens only), is one component of a comprehensive MRSA colonization surveillance program. It is not intended to diagnose MRSA infection nor to guide or monitor treatment for MRSA infections. Test performance is not FDA approved in patients less than 2 years  old. Performed at [facility], [address] [place], [place] [address]      Time coordinating discharge: Over 30 minutes  SIGNED:   Nolberto Hanlon, MD  [facility] 07/15/2021, 1:53 PM Pager   If 7PM-7AM, please contact night-coverage [email or web address] Password [email or web address]

## 2021-07-15 NOTE — Progress Notes (Signed)
PT Cancellation Note  Patient Details Name: Gabriel [name]. MRN: [id number] DOB: [birth year]   Cancelled Treatment:    Reason Eval/Treat Not Completed: Patient declined, no reason specified. Pt requesting PT to return later in a.m. due to soiling bed and wishing to eat breakfast. RN notified. Will re-attempt at a later time.    Delphia Grates. Fairly IV, PT, DPT Physical Therapist- [place]  [facility]  07/15/2021, 9:22 AM

## 2021-07-15 NOTE — TOC Progression Note (Signed)
Transition of Care (TOC) - Progression Note    Patient Details  Name: Gabriel [name]. MRN: [id number] Date of Birth: 29-May-1932  Transition of Care [facility]) CM/SW [place], [place] Phone Number: 07/15/2021, 12:22 PM  Clinical Narrative:     CSW met with patient at bedside, informed of bed acceptance at [facility] and potential dc today. Patient requested CSW speak to his brother Gabriel [name] to update as he would be the one to sign him in potentially. CSW has updated [facility] as well.   Expected Discharge Plan: [place] Barriers to Discharge: Continued Medical Work up  Expected Discharge Plan and Services Expected Discharge Plan: [place] Choice: [place] arrangements for the past 2 months: Single Family Home                                       Social Determinants of Health (SDOH) Interventions    Readmission Risk Interventions No flowsheet data found.

## 2021-07-15 NOTE — Progress Notes (Signed)
ANTICOAGULATION CONSULT NOTE  Pharmacy Consult for warfarin Indication: h/o cerebral arterial thrombosis  Patient Measurements: Height: 6' 2.02" (188 cm) Weight: 102.2 kg (225 lb 6.4 oz) IBW/kg (Calculated) : 82.24  Labs: Recent Labs    07/13/21 1109 07/14/21 0536 07/15/21 0550  HGB 16.3 15.2 14.2  HCT 48.2 44.8 42.6  PLT 205 221 232  LABPROT 21.0* 23.1* 25.3*  INR 1.8* 2.1* 2.3*  CREATININE 0.78 0.74 0.77     Estimated Creatinine Clearance: 83 mL/min (by C-G formula based on SCr of 0.77 mg/dL).   Medical History: Past Medical History:  Diagnosis Date   Cerebral arterial thrombosis 08/09/2011   Cerebrovascular accident, old 10/27/2015   Diabetes mellitus without complication (HCC)    Metformin   Essential (primary) hypertension 09/25/2014   HLD (hyperlipidemia) 10/27/2015   Hypertension    Nephrolithiasis    Pure hypercholesterolemia 09/25/2014   Type 2 diabetes mellitus (HCC) 09/25/2014    Assessment: 87 year old male on warfarin for h/o cerebral arterial thrombosis. Home dose of warfarin 5 mg daily. Patient presented after having been found down at his home; he was briefly intubated. Pharmacy consulted to restart warfarin.  7/26-27: INR on arrival in goal, but clarithro x1 & unasyn started 7/26 INR inc'd to 3.3 following day requiring hold x2d (7/27-28). 7/29: resumed dosing @1mg   LFTs: Mild transaminitis on admission. Albumin 3. Tbili 1.8 DDI: Unasyn (completed 8/2)  Date INR Plan  7/25 2.2 Last dose unk.  7/26 2.4 Warfarin 2.5 mg  7/27 3.3 Hold  7/28 3.2 Hold  7/29 2.9 1 mg  7/30 1.5 5 mg  7/31 1.2 6 mg  8/01 1.6 2.5 mg  8/02 1.8 2.5 mg  8/03 2.1 2.5 mg  8/04 2.3 2.5 mg   Goal of Therapy:  INR 2-3   Plan:  INR therapeutic Will give 2.5 mg tonight Daily INR per protocol CBC at least every 3 days per protocol  10/04, PharmD Clinical Pharmacist   07/15/2021,7:15 AM

## 2021-07-15 NOTE — Progress Notes (Signed)
Pt to discharge to [facility]. Report called to Debbie, LPN/PICC line and tele removed. EMS to transport to facility.

## 2021-07-16 ENCOUNTER — Emergency Department
Admission: EM | Admit: 2021-07-16 | Discharge: 2021-07-18 | Disposition: A | Discharge status: Home / Self Care | Payer: No Typology Code available for payment source | Source: Home / Self Care | Attending: Emergency Medicine | Admitting: Emergency Medicine

## 2021-07-16 ENCOUNTER — Emergency Department: Payer: No Typology Code available for payment source

## 2021-07-16 ENCOUNTER — Other Ambulatory Visit: Payer: Self-pay

## 2021-07-16 ENCOUNTER — Encounter: Payer: Self-pay | Admitting: Emergency Medicine

## 2021-07-16 DIAGNOSIS — L89100 Pressure ulcer of unspecified part of back, unstageable: Secondary | ICD-10-CM | POA: Insufficient documentation

## 2021-07-16 DIAGNOSIS — Z7901 Long term (current) use of anticoagulants: Secondary | ICD-10-CM | POA: Insufficient documentation

## 2021-07-16 DIAGNOSIS — Z7984 Long term (current) use of oral hypoglycemic drugs: Secondary | ICD-10-CM | POA: Insufficient documentation

## 2021-07-16 DIAGNOSIS — L89150 Pressure ulcer of sacral region, unstageable: Secondary | ICD-10-CM

## 2021-07-16 DIAGNOSIS — E119 Type 2 diabetes mellitus without complications: Secondary | ICD-10-CM | POA: Insufficient documentation

## 2021-07-16 DIAGNOSIS — Z79899 Other long term (current) drug therapy: Secondary | ICD-10-CM | POA: Insufficient documentation

## 2021-07-16 DIAGNOSIS — I10 Essential (primary) hypertension: Secondary | ICD-10-CM | POA: Insufficient documentation

## 2021-07-16 DIAGNOSIS — Z7982 Long term (current) use of aspirin: Secondary | ICD-10-CM | POA: Insufficient documentation

## 2021-07-16 DIAGNOSIS — Z515 Encounter for palliative care: Secondary | ICD-10-CM | POA: Diagnosis not present

## 2021-07-16 MED ORDER — AMOXICILLIN-POT CLAVULANATE 875-125 MG PO TABS
1.0000 | ORAL_TABLET | Freq: Once | ORAL | Status: AC
  Administered 2021-07-16: 1 via ORAL
  Filled 2021-07-16: qty 1

## 2021-07-16 MED ORDER — COLLAGENASE 250 UNIT/GM EX OINT
TOPICAL_OINTMENT | Freq: Every day | CUTANEOUS | Status: DC
  Filled 2021-07-16: qty 30

## 2021-07-16 NOTE — ED Notes (Signed)
Pt refused BS check at this time.

## 2021-07-16 NOTE — Progress Notes (Signed)
   07/16/21 1450  Clinical Encounter Type  Visited With Patient  Visit Type Follow-up;Spiritual support;Social support  Luna Fuse noticed Mr. Gabriel [name] in ED triage area; chaplain preserved Pt's dignity by providing him with a blanket. Mr. Dahlem remembered chaplain from his recent inpatient stay. Triage began so visit was brief.

## 2021-07-16 NOTE — ED Notes (Signed)
Patient refused to let the IV RN start an IV. Dr. Fanny Bien informed.

## 2021-07-16 NOTE — ED Provider Notes (Addendum)
[facility] EMERGENCY DEPARTMENT Provider Note   CSN: [id number] Arrival date & time: 07/16/21  1430     History Chief Complaint  Patient presents with   Abscess    Gabriel [name]. is a 87 y.o. male presents to the emergency department for evaluation of sacral ulcer and back ulcer evaluation.  Patient was recently admitted to the hospital and discharged to skilled nursing facility yesterday.  Patient was sent back to the ER today for evaluation of possible abscess formation.  He denies any pain or fevers.  No drainage.  Patient has not been on any antibiotics.  Foam pads along with Santyl have been recommended.  He has not been seeing wound clinic.  Patient denies any chest pain, shortness of breath.  HPI     Past Medical History:  Diagnosis Date   Cerebral arterial thrombosis 08/09/2011   Cerebrovascular accident, old 10/27/2015   Diabetes mellitus without complication (HCC)    Metformin   Essential (primary) hypertension 09/25/2014   HLD (hyperlipidemia) 10/27/2015   Hypertension    Nephrolithiasis    Pure hypercholesterolemia 09/25/2014   Type 2 diabetes mellitus (HCC) 09/25/2014    Patient Active Problem List   Diagnosis Date Noted   On mechanically assisted ventilation (HCC) 07/06/2021   Uncontrolled type 2 diabetes mellitus (HCC) 07/06/2021   Acute metabolic encephalopathy 07/06/2021   Acute kidney injury (HCC) 07/06/2021   Pressure injury of skin of sacral region 07/06/2021   Altered mental status    Elevated CK    Acute respiratory failure (HCC) 07/05/2021   HLD (hyperlipidemia) 10/27/2015   Cerebrovascular accident, old 10/27/2015   Essential (primary) hypertension 09/25/2014   Pure hypercholesterolemia 09/25/2014   Type 2 diabetes mellitus (HCC) 09/25/2014   Cerebral arterial thrombosis 08/09/2011   Long term current use of anticoagulant 08/09/2011    Past Surgical History:  Procedure Laterality Date   CHOLECYSTECTOMY      CYSTOSCOPY WITH STENT PLACEMENT Left 11/02/2015   Procedure: CYSTOSCOPY WITH STENT PLACEMENT;  Surgeon: Vanna Scotland, MD;  Location: [facility] ORS;  Service: Urology;  Laterality: Left;   TONSILLECTOMY     URETEROSCOPY WITH HOLMIUM LASER LITHOTRIPSY Left 11/02/2015   Procedure: URETEROSCOPY WITH HOLMIUM LASER LITHOTRIPSY;  Surgeon: Vanna Scotland, MD;  Location: [facility] ORS;  Service: Urology;  Laterality: Left;       Family History  Problem Relation Age of Onset   Prostate cancer Neg Hx    Bladder Cancer Neg Hx    Kidney cancer Neg Hx    Nephrolithiasis Brother     Social History   Tobacco Use   Smoking status: Never   Smokeless tobacco: Never  Substance Use Topics   Alcohol use: No    Alcohol/week: 0.0 standard drinks   Drug use: No    Home Medications Prior to Admission medications   Medication Sig Start Date End Date Taking? Authorizing Provider  acetaminophen (TYLENOL) 500 MG tablet Take 500 mg by mouth every 6 (six) hours as needed.    [provider]  aspirin EC 81 MG tablet Take 81 mg by mouth daily.    [provider]  atorvastatin (LIPITOR) 40 MG tablet Take 40 mg by mouth at bedtime.    [provider]  collagenase (SANTYL) ointment Apply 1 application topically daily. 07/16/21   Lynn Ito, MD  empagliflozin (JARDIANCE) 10 MG TABS tablet Take 10 mg by mouth daily. 03/30/21 03/30/22  [provider]  glipiZIDE (GLUCOTROL XL) 10 MG 24 hr  tablet Take 10 mg by mouth daily. 04/19/21   [provider]  metFORMIN (GLUCOPHAGE) 1000 MG tablet Take 1,000 mg by mouth 2 (two) times daily.    [provider]  metoprolol succinate (TOPROL-XL) 25 MG 24 hr tablet Take 25 mg by mouth 2 (two) times daily.    [provider]  Multiple Vitamin (MULTIVITAMIN WITH MINERALS) TABS tablet Take 1 tablet by mouth daily. 07/16/21   Lynn Ito, MD  Nutritional Supplements (FEEDING SUPPLEMENT, NEPRO CARB STEADY,) LIQD Take 237 mLs by mouth 2  (two) times daily between meals. 07/15/21   Lynn Ito, MD  Nystatin (GERHARDT'S BUTT CREAM) CREA Apply 1 application topically 2 (two) times daily. 07/15/21   Lynn Ito, MD  pantoprazole (PROTONIX) 40 MG tablet Take 1 tablet (40 mg total) by mouth at bedtime. 07/15/21   Lynn Ito, MD  polyethylene glycol (MIRALAX / GLYCOLAX) 17 g packet Take 17 g by mouth daily as needed for moderate constipation. 07/15/21   Lynn Ito, MD  tamsulosin (FLOMAX) 0.4 MG CAPS capsule Take 1 capsule (0.4 mg total) by mouth daily. 12/03/15   Vanna Scotland, MD  vitamin C (ASCORBIC ACID) 500 MG tablet Take 500 mg by mouth daily.    [provider]  vitamin E 400 UNIT capsule Take 400 Units by mouth daily.    [provider]  warfarin (COUMADIN) 2.5 MG tablet Take 1 tablet (2.5 mg total) by mouth one time only at 4 PM. 07/15/21   Lynn Ito, MD    Allergies    Darvon [propoxyphene], Glipizide, and Saxagliptin  Review of Systems   Review of Systems  Constitutional:  Negative for chills and fever.  Respiratory:  Negative for cough and shortness of breath.   Cardiovascular:  Negative for chest pain.  Gastrointestinal:  Negative for abdominal pain, nausea and vomiting.  Musculoskeletal:  Negative for back pain and myalgias.  Skin:  Positive for wound.  Neurological:  Negative for dizziness, light-headedness and headaches.  Psychiatric/Behavioral:  Negative for confusion.    Physical Exam Updated Vital Signs BP (!) 122/52 (BP Location: Right Arm)   Pulse 98   Temp 98.4 F (36.9 C)   Resp 20   Ht 6\' 2"  (1.88 m)   Wt 102.1 kg   SpO2 92%   BMI 28.89 kg/m   Physical Exam Constitutional:      General: He is not in acute distress.    Appearance: Normal appearance. He is well-developed.  HENT:     Head: Normocephalic and atraumatic.  Eyes:     Conjunctiva/sclera: Conjunctivae normal.  Cardiovascular:     Rate and Rhythm: Normal rate.  Pulmonary:     Effort: Pulmonary effort is  normal. No respiratory distress.  Musculoskeletal:        General: Normal range of motion.     Cervical back: Normal range of motion.  Skin:    General: Skin is warm.     Findings: No rash.     Comments: Large 8 cm diameter sacral ulcer lumbosacral region with no fluctuance.  There is significant eschar tissue present with no tenderness to palpation.  There is no underlying fluctuance or drainage.  Around the edge of the eschar tissue there is a mild thin less than 1 cm area of erythema.  There is no deep wounds or exposed bone.  No surrounding fluctuance or induration.  Patient also with large mid back along the thoracic spine ulcer with very similar eschar tissue.  Approximately 8 cm  in diameter with a very thin circumferential area of erythema around the wound.  Again no tenderness, fluctuance or induration.  No drainage.  Neurological:     General: No focal deficit present.     Mental Status: He is alert and oriented to person, place, and time.  Psychiatric:        Behavior: Behavior normal.        Thought Content: Thought content normal.    ED Results / Procedures / Treatments   Labs (all labs ordered are listed, but only abnormal results are displayed) Labs Reviewed  AEROBIC/ANAEROBIC CULTURE W GRAM STAIN (SURGICAL/DEEP WOUND)  CBG MONITORING, ED    EKG None  Radiology No results found.  Procedures Procedures   Medications Ordered in ED Medications  amoxicillin-clavulanate (AUGMENTIN) 875-125 MG per tablet 1 tablet (has no administration in time range)  collagenase (SANTYL) ointment (has no administration in time range)    ED Course  I have reviewed the triage vital signs and the nursing notes.  Pertinent labs & imaging results that were available during my care of the patient were reviewed by me and considered in my medical decision making (see chart for details).    MDM Rules/Calculators/A&P                         87 year old male discharged from the hospital  yesterday.  Labs were within normal limits.  He presents today for repeat evaluation of sacral and back ulcers.  Facility was concerned for possible abscess formation although there is no swelling, fluctuance, tenderness.  Both sacral areas have significant eschar tissue present.  Minimal surrounding cellulitis.  He has not been on any antibiotics.  His vital signs are stable, afebrile nontachycardic with normal blood pressure.  Patient is alert and oriented x3.  He is refusing imaging, lab work.  I recommended imaging to be thorough although I do have a low suspicion for any type of osteomyelitis or abscess formation based on exam, history, previous labs and vital signs.  Reasonable to discontinue MRI.  Patient did have blood work yesterday that was all within normal limits and his vital signs are stable today.  Discussed treatment with patient, will start him on a oral antibiotic, Augmentin.  Recommend he follow-up with wound clinic and in the meantime rehab facility continue with daily dressing changes with foam pad and Santyl along with positional changing frequently. Final Clinical Impression(s) / ED Diagnoses Final diagnoses:  Pressure injury of sacral region, unstageable (HCC)  Pressure injury of back, unstageable [facility])    Rx / DC Orders ED Discharge Orders     None        [place], [place], PA-C 07/16/21 2206    Advit, [place], PA-C 07/16/21 2207    Sharyn Creamer, MD 07/17/21 1911

## 2021-07-16 NOTE — Discharge Instructions (Addendum)
Continue with daily Santyl to sacral and back ulcer along with foam dressing changes.  Recommend outpatient follow-up with wound care center for debridement of eschar

## 2021-07-16 NOTE — Progress Notes (Signed)
IV Team consulted for difficult IV placement.  Upon arrival, pt refused to have vascular access assessed.  Primary RN notified and came to bedside.  Pt refused in her presence.  RN to notify MD.

## 2021-07-16 NOTE — ED Provider Notes (Signed)
Medical screening examination/treatment/procedure(s) were conducted as a shared visit with non-physician practitioner(s) and myself.  I personally evaluated the patient during the encounter.  Patient does have couple areas of pressure sore development of 1 near his sacrum is a very small area that has slight drainage, not on his thoracic spine appears clean.  Patient is in the care of nursing facility.     Sharyn Creamer, MD 07/17/21 [phone]

## 2021-07-16 NOTE — ED Triage Notes (Signed)
First Nurse Note:  Arrives from [facility] for evaluation of sacral wounds.   Patient discharged from hospital yesterday with sacral wounds.  VS wnl.

## 2021-07-16 NOTE — ED Notes (Signed)
Patient refused the MRI

## 2021-07-16 NOTE — ED Notes (Signed)
Pt placed in recliner and sitting in subwait area. Call light in reach, instructed to call before he gets up.

## 2021-07-16 NOTE — ED Notes (Addendum)
Patient was incontinent to urine. Patient's brief and linens were changed. Santyl cream was placed on sacrum and upper back wounds and covered with Mepilex. Patient tolerated the procedure well and remained pleasant.Sacral wound was covered with Eschar and the same with upper back wound which was also edematous.

## 2021-07-16 NOTE — ED Notes (Signed)
Waiting for transport

## 2021-07-16 NOTE — ED Triage Notes (Signed)
Arrives from [facility] via EMS for evaluation of sacral wounds.  Patient discharged from hospital yesterday with sacral wounds. Staff want further evaluation of wounds. Pt denies pain, NAD.

## 2021-07-18 ENCOUNTER — Inpatient Hospital Stay
Admission: EM | Admit: 2021-07-18 | Discharge: 2021-08-12 | DRG: 951 | Disposition: E | Payer: No Typology Code available for payment source | Attending: Internal Medicine | Admitting: Internal Medicine

## 2021-07-18 ENCOUNTER — Other Ambulatory Visit: Payer: Self-pay

## 2021-07-18 ENCOUNTER — Emergency Department: Payer: No Typology Code available for payment source

## 2021-07-18 DIAGNOSIS — Z87442 Personal history of urinary calculi: Secondary | ICD-10-CM

## 2021-07-18 DIAGNOSIS — Z20822 Contact with and (suspected) exposure to covid-19: Secondary | ICD-10-CM | POA: Diagnosis present

## 2021-07-18 DIAGNOSIS — L89150 Pressure ulcer of sacral region, unstageable: Secondary | ICD-10-CM | POA: Diagnosis present

## 2021-07-18 DIAGNOSIS — Z7901 Long term (current) use of anticoagulants: Secondary | ICD-10-CM

## 2021-07-18 DIAGNOSIS — J189 Pneumonia, unspecified organism: Secondary | ICD-10-CM | POA: Diagnosis present

## 2021-07-18 DIAGNOSIS — Z515 Encounter for palliative care: Principal | ICD-10-CM

## 2021-07-18 DIAGNOSIS — A419 Sepsis, unspecified organism: Secondary | ICD-10-CM | POA: Diagnosis present

## 2021-07-18 DIAGNOSIS — Z888 Allergy status to other drugs, medicaments and biological substances status: Secondary | ICD-10-CM

## 2021-07-18 DIAGNOSIS — Z8673 Personal history of transient ischemic attack (TIA), and cerebral infarction without residual deficits: Secondary | ICD-10-CM | POA: Diagnosis not present

## 2021-07-18 DIAGNOSIS — I452 Bifascicular block: Secondary | ICD-10-CM | POA: Diagnosis present

## 2021-07-18 DIAGNOSIS — J9601 Acute respiratory failure with hypoxia: Secondary | ICD-10-CM | POA: Diagnosis present

## 2021-07-18 DIAGNOSIS — Z9049 Acquired absence of other specified parts of digestive tract: Secondary | ICD-10-CM

## 2021-07-18 DIAGNOSIS — I4891 Unspecified atrial fibrillation: Secondary | ICD-10-CM | POA: Diagnosis present

## 2021-07-18 DIAGNOSIS — Z79899 Other long term (current) drug therapy: Secondary | ICD-10-CM

## 2021-07-18 DIAGNOSIS — E119 Type 2 diabetes mellitus without complications: Secondary | ICD-10-CM | POA: Diagnosis present

## 2021-07-18 DIAGNOSIS — I10 Essential (primary) hypertension: Secondary | ICD-10-CM | POA: Diagnosis present

## 2021-07-18 DIAGNOSIS — Z7982 Long term (current) use of aspirin: Secondary | ICD-10-CM | POA: Diagnosis not present

## 2021-07-18 DIAGNOSIS — Z7984 Long term (current) use of oral hypoglycemic drugs: Secondary | ICD-10-CM

## 2021-07-18 DIAGNOSIS — E7800 Pure hypercholesterolemia, unspecified: Secondary | ICD-10-CM | POA: Diagnosis present

## 2021-07-18 DIAGNOSIS — Z66 Do not resuscitate: Secondary | ICD-10-CM | POA: Diagnosis present

## 2021-07-18 DIAGNOSIS — O8604 Sepsis following an obstetrical procedure: Secondary | ICD-10-CM

## 2021-07-18 DIAGNOSIS — R0902 Hypoxemia: Secondary | ICD-10-CM

## 2021-07-18 LAB — CBC WITH DIFFERENTIAL/PLATELET
Abs Immature Granulocytes: 0.16 10*3/uL — ABNORMAL HIGH (ref 0.00–0.07)
Basophils Absolute: 0.1 10*3/uL (ref 0.0–0.1)
Basophils Relative: 0 %
Eosinophils Absolute: 0.1 10*3/uL (ref 0.0–0.5)
Eosinophils Relative: 0 %
HCT: 47.3 % (ref 39.0–52.0)
Hemoglobin: 15.6 g/dL (ref 13.0–17.0)
Immature Granulocytes: 1 %
Lymphocytes Relative: 3 %
Lymphs Abs: 0.7 10*3/uL (ref 0.7–4.0)
MCH: 33.7 pg (ref 26.0–34.0)
MCHC: 33 g/dL (ref 30.0–36.0)
MCV: 102.2 fL — ABNORMAL HIGH (ref 80.0–100.0)
Monocytes Absolute: 1 10*3/uL (ref 0.1–1.0)
Monocytes Relative: 5 %
Neutro Abs: 18.5 10*3/uL — ABNORMAL HIGH (ref 1.7–7.7)
Neutrophils Relative %: 91 %
Platelets: 267 10*3/uL (ref 150–400)
RBC: 4.63 MIL/uL (ref 4.22–5.81)
RDW: 13.2 % (ref 11.5–15.5)
WBC: 20.6 10*3/uL — ABNORMAL HIGH (ref 4.0–10.5)
nRBC: 0 % (ref 0.0–0.2)

## 2021-07-18 LAB — BLOOD GAS, VENOUS
Acid-Base Excess: 4.2 mmol/L — ABNORMAL HIGH (ref 0.0–2.0)
Bicarbonate: 31.5 mmol/L — ABNORMAL HIGH (ref 20.0–28.0)
O2 Saturation: 78.2 %
Patient temperature: 37
pCO2, Ven: 57 mmHg (ref 44.0–60.0)
pH, Ven: 7.35 (ref 7.250–7.430)
pO2, Ven: 45 mmHg (ref 32.0–45.0)

## 2021-07-18 LAB — RESP PANEL BY RT-PCR (FLU A&B, COVID) ARPGX2
Influenza A by PCR: NEGATIVE
Influenza B by PCR: NEGATIVE
SARS Coronavirus 2 by RT PCR: NEGATIVE

## 2021-07-18 LAB — LACTIC ACID, PLASMA: Lactic Acid, Venous: 1.8 mmol/L (ref 0.5–1.9)

## 2021-07-18 MED ORDER — ACETAMINOPHEN 325 MG PO TABS: 650.0000 mg | ORAL_TABLET | Freq: Four times a day (QID) | ORAL | Status: DC | PRN

## 2021-07-18 MED ORDER — HALOPERIDOL 0.5 MG PO TABS
0.5000 mg | ORAL_TABLET | ORAL | Status: DC | PRN
  Filled 2021-07-18: qty 1

## 2021-07-18 MED ORDER — HALOPERIDOL LACTATE 2 MG/ML PO CONC
0.5000 mg | ORAL | Status: DC | PRN
  Filled 2021-07-18: qty 0.3

## 2021-07-18 MED ORDER — GLYCOPYRROLATE 0.2 MG/ML IJ SOLN
0.2000 mg | INTRAMUSCULAR | Status: DC | PRN
  Filled 2021-07-18: qty 1

## 2021-07-18 MED ORDER — DIPHENHYDRAMINE HCL 50 MG/ML IJ SOLN: 12.5000 mg | INTRAMUSCULAR | Status: DC | PRN

## 2021-07-18 MED ORDER — SODIUM CHLORIDE 0.9 % IV SOLN
12.5000 mg | Freq: Four times a day (QID) | INTRAVENOUS | Status: DC | PRN
  Filled 2021-07-18: qty 0.5

## 2021-07-18 MED ORDER — BIOTENE DRY MOUTH MT LIQD
15.0000 mL | OROMUCOSAL | Status: DC | PRN
  Filled 2021-07-18: qty 15

## 2021-07-18 MED ORDER — LORAZEPAM 2 MG/ML IJ SOLN: 1.0000 mg | INTRAMUSCULAR | Status: DC | PRN

## 2021-07-18 MED ORDER — OXYCODONE HCL 20 MG/ML PO CONC: 5.0000 mg | ORAL | Status: DC | PRN

## 2021-07-18 MED ORDER — HALOPERIDOL LACTATE 5 MG/ML IJ SOLN: 0.5000 mg | INTRAMUSCULAR | Status: DC | PRN

## 2021-07-18 MED ORDER — SODIUM CHLORIDE 0.9 % IV SOLN: 250.0000 mL | INTRAVENOUS | Status: DC | PRN

## 2021-07-18 MED ORDER — LORAZEPAM 2 MG/ML PO CONC: 1.0000 mg | ORAL | Status: DC | PRN

## 2021-07-18 MED ORDER — LOPERAMIDE HCL 2 MG PO CAPS: 2.0000 mg | ORAL_CAPSULE | ORAL | Status: DC | PRN

## 2021-07-18 MED ORDER — MORPHINE 100MG IN NS 100ML (1MG/ML) PREMIX INFUSION
2.0000 mg/h | INTRAVENOUS | Status: DC
Start: 2021-07-18 — End: 2021-07-19
  Administered 2021-07-18: 2 mg/h via INTRAVENOUS
  Filled 2021-07-18 (×2): qty 100

## 2021-07-18 MED ORDER — GLYCOPYRROLATE 1 MG PO TABS
1.0000 mg | ORAL_TABLET | ORAL | Status: DC | PRN
  Filled 2021-07-18: qty 1

## 2021-07-18 MED ORDER — ONDANSETRON 4 MG PO TBDP
4.0000 mg | ORAL_TABLET | Freq: Four times a day (QID) | ORAL | Status: DC | PRN
  Filled 2021-07-18: qty 1

## 2021-07-18 MED ORDER — SENNA 8.6 MG PO TABS: 1.0000 | ORAL_TABLET | Freq: Every evening | ORAL | Status: DC | PRN

## 2021-07-18 MED ORDER — PANTOPRAZOLE SODIUM 40 MG PO TBEC: 40.0000 mg | DELAYED_RELEASE_TABLET | Freq: Every day | ORAL | Status: DC

## 2021-07-18 MED ORDER — ONDANSETRON HCL 4 MG/2ML IJ SOLN: 4.0000 mg | Freq: Four times a day (QID) | INTRAMUSCULAR | Status: DC | PRN

## 2021-07-18 MED ORDER — SODIUM CHLORIDE 0.9% FLUSH
3.0000 mL | Freq: Two times a day (BID) | INTRAVENOUS | Status: DC
  Administered 2021-07-18 – 2021-07-19 (×2): 3 mL via INTRAVENOUS

## 2021-07-18 MED ORDER — ACETAMINOPHEN 650 MG RE SUPP: 650.0000 mg | Freq: Four times a day (QID) | RECTAL | Status: DC | PRN

## 2021-07-18 MED ORDER — LORAZEPAM 1 MG PO TABS: 1.0000 mg | ORAL_TABLET | ORAL | Status: DC | PRN

## 2021-07-18 MED ORDER — SODIUM CHLORIDE 0.9% FLUSH: 3.0000 mL | INTRAVENOUS | Status: DC | PRN

## 2021-07-18 MED ORDER — POLYVINYL ALCOHOL 1.4 % OP SOLN
1.0000 [drp] | Freq: Four times a day (QID) | OPHTHALMIC | Status: DC | PRN
  Filled 2021-07-18: qty 15

## 2021-07-18 MED ORDER — CLINDAMYCIN PHOSPHATE 300 MG/50ML IV SOLN
300.0000 mg | Freq: Once | INTRAVENOUS | Status: DC
  Filled 2021-07-18: qty 50

## 2021-07-18 MED ORDER — TRAZODONE HCL 50 MG PO TABS: 25.0000 mg | ORAL_TABLET | Freq: Every evening | ORAL | Status: DC | PRN

## 2021-07-18 MED ORDER — SODIUM CHLORIDE 0.9 % IV SOLN
1.0000 g | Freq: Once | INTRAVENOUS | Status: AC
  Administered 2021-07-18: 1 g via INTRAVENOUS
  Filled 2021-07-18: qty 1

## 2021-07-18 MED ORDER — MAGIC MOUTHWASH W/LIDOCAINE
15.0000 mL | Freq: Four times a day (QID) | ORAL | Status: DC | PRN
  Filled 2021-07-18: qty 15

## 2021-07-18 MED ORDER — MORPHINE BOLUS VIA INFUSION
2.0000 mg | INTRAVENOUS | Status: DC | PRN
  Filled 2021-07-18: qty 2

## 2021-07-18 NOTE — ED Triage Notes (Signed)
BIB EMS from [facility]. Pt complain of SOB. Was seen here Friday for pressures ulcers.  Pt was 70s on room air. 80s on 6lpm then placed on NRB at 15 lpm improved to 97%.  Pt is tachycardic and tachypnea.

## 2021-07-18 NOTE — ED Notes (Signed)
Spoke with pt brother, family has agreed to DC all treatment and would like to make pt comfort care. He informed MD Malinda as well.

## 2021-07-18 NOTE — ED Notes (Signed)
Changed pt brief and gave him some water at his request. He will not keep on cardiac leads or NRB. Placed him on a [place] at 6lpm and he is tolerating that better. Leads replaced as well.

## 2021-07-18 NOTE — ED Notes (Signed)
Hospitalist came down and completed DNR form.

## 2021-07-18 NOTE — H&P (Signed)
History and Physical    Gregor Hams. IRJ:[id number] DOB: 1932-07-13 DOA: 07/13/2021  PCP: Dione Housekeeper, MD  Chief Complaint: Shortness of breath  HPI: Gabriel [name]. is a 87 y.o. male with a past medical history of hypertension, history of CVA, non-insulin-dependent diabetes mellitus type 2, hyperlipidemia, history of nephrolithiasis.  Of note the patient was recently admitted to this facility after being found on the ground at home unresponsive by police during a wellness check.  He was intubated in the emergency department and placed on mechanical ventilation due to inability to protect his airway and his unresponsive state.  Was in the ICU and was able to be extubated and downgraded to the medical floor and eventually discharged to local nursing home.  He comes in now from [facility] complaining of shortness of breath.  At [facility] he was satting in the 70s on room air and 80s on 6 L and then 97% on 15 L nonrebreather.  He was noted to be tachycardic and tachypneic in the emergency department.  No complaints of chest pain or chest tightness.  No fevers or chills.  Work-up in the emergency department showed right lung base consolidation consistent with pneumonia.  He also had an elevated white blood cell count 20.6.  He still remains on 15 L nonrebreather.  ED provider had a lengthy discussion with the patient and his brother who is the healthcare power of attorney.  The patient's brother elected to make the patient comfort care only.  The patient is okay with brother's decision.  This was also discussed apparently with the sister who is also okay with the decision.  The ED therefore stopped antibiotics and any efurther blood work and maintained him on nonrebreather.  I personally had a lengthy discussion with the brother.  He showed me the healthcare power of attorney paperwork.  He proceeded to make the patient DNR/DNI.  DNR paperwork was filled out and given to the  nurse to place in the chart.  CODE STATUS was changed in the EMR. The patient's brother does not want any pastoral services.  We will initiate comfort care order set.  Consult to palliative care will be placed.  ED Course: VBG, CBC with differential, lactic acid; chest x-ray.  Review of Systems: 14 point review of systems is negative except for what is mentioned above in the HPI.   Past Medical History:  Diagnosis Date   Cerebral arterial thrombosis 08/09/2011   Cerebrovascular accident, old 10/27/2015   Diabetes mellitus without complication (HCC)    Metformin   Essential (primary) hypertension 09/25/2014   HLD (hyperlipidemia) 10/27/2015   Hypertension    Nephrolithiasis    Pure hypercholesterolemia 09/25/2014   Type 2 diabetes mellitus (HCC) 09/25/2014    Past Surgical History:  Procedure Laterality Date   CHOLECYSTECTOMY     CYSTOSCOPY WITH STENT PLACEMENT Left 11/02/2015   Procedure: CYSTOSCOPY WITH STENT PLACEMENT;  Surgeon: Vanna Scotland, MD;  Location: [facility] ORS;  Service: Urology;  Laterality: Left;   TONSILLECTOMY     URETEROSCOPY WITH HOLMIUM LASER LITHOTRIPSY Left 11/02/2015   Procedure: URETEROSCOPY WITH HOLMIUM LASER LITHOTRIPSY;  Surgeon: Vanna Scotland, MD;  Location: [facility] ORS;  Service: Urology;  Laterality: Left;    Social History   Socioeconomic History   Marital status: Widowed    Spouse name: Not on file   Number of children: Not on file   Years of education: Not on file   Highest education level:  Not on file  Occupational History   Not on file  Tobacco Use   Smoking status: Never   Smokeless tobacco: Never  Substance and Sexual Activity   Alcohol use: No    Alcohol/week: 0.0 standard drinks   Drug use: No   Sexual activity: Not on file  Other Topics Concern   Not on file  Social History Narrative   Not on file   Social Determinants of Health   Financial Resource Strain: Not on file  Food Insecurity: Not on file  Transportation Needs: Not  on file  Physical Activity: Not on file  Stress: Not on file  Social Connections: Not on file  Intimate Partner Violence: Not on file    Allergies  Allergen Reactions   Darvon [Propoxyphene] Hives   Glipizide Other (See Comments)   Saxagliptin Other (See Comments)    Family History  Problem Relation Age of Onset   Prostate cancer Neg Hx    Bladder Cancer Neg Hx    Kidney cancer Neg Hx    Nephrolithiasis Brother     Prior to Admission medications   Medication Sig Start Date End Date Taking? Authorizing Provider  acetaminophen (TYLENOL) 500 MG tablet Take 500 mg by mouth every 6 (six) hours as needed.    [provider]  aspirin EC 81 MG tablet Take 81 mg by mouth daily.    [provider]  atorvastatin (LIPITOR) 40 MG tablet Take 40 mg by mouth at bedtime.    [provider]  collagenase (SANTYL) ointment Apply 1 application topically daily. 07/16/21   Lynn Ito, MD  empagliflozin (JARDIANCE) 10 MG TABS tablet Take 10 mg by mouth daily. 03/30/21 03/30/22  [provider]  glipiZIDE (GLUCOTROL XL) 10 MG 24 hr tablet Take 10 mg by mouth daily. 04/19/21   [provider]  metFORMIN (GLUCOPHAGE) 1000 MG tablet Take 1,000 mg by mouth 2 (two) times daily.    [provider]  metoprolol succinate (TOPROL-XL) 25 MG 24 hr tablet Take 25 mg by mouth 2 (two) times daily.    [provider]  Multiple Vitamin (MULTIVITAMIN WITH MINERALS) TABS tablet Take 1 tablet by mouth daily. 07/16/21   Lynn Ito, MD  Nutritional Supplements (FEEDING SUPPLEMENT, NEPRO CARB STEADY,) LIQD Take 237 mLs by mouth 2 (two) times daily between meals. 07/15/21   Lynn Ito, MD  Nystatin (GERHARDT'S BUTT CREAM) CREA Apply 1 application topically 2 (two) times daily. 07/15/21   Lynn Ito, MD  pantoprazole (PROTONIX) 40 MG tablet Take 1 tablet (40 mg total) by mouth at bedtime. 07/15/21   Lynn Ito, MD  polyethylene glycol (MIRALAX / GLYCOLAX) 17 g packet  Take 17 g by mouth daily as needed for moderate constipation. 07/15/21   Lynn Ito, MD  tamsulosin (FLOMAX) 0.4 MG CAPS capsule Take 1 capsule (0.4 mg total) by mouth daily. 12/03/15   Vanna Scotland, MD  vitamin C (ASCORBIC ACID) 500 MG tablet Take 500 mg by mouth daily.    [provider]  vitamin E 400 UNIT capsule Take 400 Units by mouth daily.    [provider]  warfarin (COUMADIN) 2.5 MG tablet Take 1 tablet (2.5 mg total) by mouth one time only at 4 PM. 07/15/21   Lynn Ito, MD    Physical Exam: Vitals:   07/12/2021 1700 07/24/2021 1730 07/31/2021 1800 07/26/2021 1830  BP: (!) 121/47 131/62 126/61 122/66  Pulse: 91 93 95 95  Resp: (!) 25 (!) 26 (!) 23 Marland Kitchen)  23  Temp:    98.2 F (36.8 C)  TempSrc:    Axillary  SpO2: 98% 95% 98% 100%  Height:         General:  Appears calm and comfortable and is in NAD Cardiovascular:  RRR, no m/r/g.  Respiratory: Increased respiratory effort and bilateral crackles in the bases on lung auscultation Abdomen:  soft, NT, ND, NABS Skin:  no rash or induration seen on limited exam Musculoskeletal:  grossly normal tone BUE/BLE, good ROM, no bony abnormality Lower extremity:  1+ edema both arms and legs.  Limited foot exam with no ulcerations.  2+ distal pulses. Psychiatric:  grossly normal mood and affect, speech fluent and appropriate, AOx2 Neurologic:  CN 2-12 grossly intact, moves all extremities in coordinated fashion, sensation intact    Radiological Exams on Admission: Independently reviewed - see discussion in A/P where applicable  DG Chest Portable 1 View  Result Date: 07/23/2021 CLINICAL DATA:  Dyspnea, hypoxia EXAM: PORTABLE CHEST 1 VIEW COMPARISON:  07/12/2021 FINDINGS: Lung volumes are small. Small right basilar pleural effusion again noted. Superimposed collapse and consolidation of the right lung base. Left basilar atelectasis or infiltrate again noted. Tiny left pleural effusion suspected. No pneumothorax. Cardiac size  within normal limits. Pulmonary vascularity is normal. IMPRESSION: Progressive pulmonary hypoinflation. Stable small right pleural effusion with progressive right basilar collapse and consolidation. Stable left basilar atelectasis or infiltrate. Electronically Signed   By: Helyn Numbers MD   On: 07/29/2021 12:46    EKG: Independently reviewed.  A. 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   Labs on Admission: I have personally reviewed the available labs and imaging studies at the time of the admission.  Pertinent labs: WBC 20.6, lactic acid 1.8     Assessment/Plan: Acute hypoxic respiratory failure secondary to right lower lobe pneumonia: The patient will be admitted to the medical/surgical floor under inpatient status.  The brother who is the healthcare power of attorney has elected to make the patient comfort care only.  CODE STATUS has been changed to DNR.  Comfort care orders have been started.  Palliative care has been consulted.   Level of Care: MedSurg DVT prophylaxis: None Code Status: DNR/DNI Consults: Palliative care Admission status: Inpatient   Verdia Kuba DO [facility]   How to contact the [facility] Attending or Consulting provider 7A - 7P or covering provider during after hours 7P -7A, for this patient?  Check the care team in [facility] and look for a) attending/consulting [facility] provider listed and b) the [facility] team listed Log into [email or web address] and use [place]'s universal password to access. If you do not have the password, please contact the hospital operator. Locate the [facility] provider you are looking for under [facility] and page to a number that you can be directly reached. If you still have difficulty reaching the provider, please page the [facility] (Director on Call) for the Hospitalists listed on amion for assistance.   08/04/2021, 7:24 PM

## 2021-07-18 NOTE — ED Provider Notes (Addendum)
[facility] Emergency Department Provider Note  ____________________________________________   Event Date/Time   First MD Initiated Contact with Patient 08-09-21 1217     (approximate)  I have reviewed the triage vital signs and the nursing notes.   HISTORY  Chief Complaint Shortness of Breath   HPI Gabriel [name]. is a 87 y.o. male who comes in from [facility] complaining shortness of breath.  He was seen here 2 and half days ago for pressure ulcers.  At [facility] he was satting in the 70s on room air on the 80s on 6 L and then 97% on nonrebreather.  He is tachycardic and tachypneic.  He is not complaining of any chest pain or tightness.  Just shortness of breath.  He is not running a fever.        Past Medical History:  Diagnosis Date   Cerebral arterial thrombosis 08/09/2011   Cerebrovascular accident, old 10/27/2015   Diabetes mellitus without complication (HCC)    Metformin   Essential (primary) hypertension 09/25/2014   HLD (hyperlipidemia) 10/27/2015   Hypertension    Nephrolithiasis    Pure hypercholesterolemia 09/25/2014   Type 2 diabetes mellitus (HCC) 09/25/2014    Patient Active Problem List   Diagnosis Date Noted   On mechanically assisted ventilation (HCC) 07/06/2021   Uncontrolled type 2 diabetes mellitus (HCC) 07/06/2021   Acute metabolic encephalopathy 07/06/2021   Acute kidney injury (HCC) 07/06/2021   Pressure injury of skin of sacral region 07/06/2021   Altered mental status    Elevated CK    Acute respiratory failure (HCC) 07/05/2021   HLD (hyperlipidemia) 10/27/2015   Cerebrovascular accident, old 10/27/2015   Essential (primary) hypertension 09/25/2014   Pure hypercholesterolemia 09/25/2014   Type 2 diabetes mellitus (HCC) 09/25/2014   Cerebral arterial thrombosis 08/09/2011   Long term current use of anticoagulant 08/09/2011    Past Surgical History:  Procedure Laterality Date   CHOLECYSTECTOMY      CYSTOSCOPY WITH STENT PLACEMENT Left 11/02/2015   Procedure: CYSTOSCOPY WITH STENT PLACEMENT;  Surgeon: Vanna Scotland, MD;  Location: [facility] ORS;  Service: Urology;  Laterality: Left;   TONSILLECTOMY     URETEROSCOPY WITH HOLMIUM LASER LITHOTRIPSY Left 11/02/2015   Procedure: URETEROSCOPY WITH HOLMIUM LASER LITHOTRIPSY;  Surgeon: Vanna Scotland, MD;  Location: [facility] ORS;  Service: Urology;  Laterality: Left;    Prior to Admission medications   Medication Sig Start Date End Date Taking? Authorizing Provider  acetaminophen (TYLENOL) 500 MG tablet Take 500 mg by mouth every 6 (six) hours as needed.    [provider]  aspirin EC 81 MG tablet Take 81 mg by mouth daily.    [provider]  atorvastatin (LIPITOR) 40 MG tablet Take 40 mg by mouth at bedtime.    [provider]  collagenase (SANTYL) ointment Apply 1 application topically daily. 07/16/21   Lynn Ito, MD  empagliflozin (JARDIANCE) 10 MG TABS tablet Take 10 mg by mouth daily. 03/30/21 03/30/22  [provider]  glipiZIDE (GLUCOTROL XL) 10 MG 24 hr tablet Take 10 mg by mouth daily. 04/19/21   [provider]  metFORMIN (GLUCOPHAGE) 1000 MG tablet Take 1,000 mg by mouth 2 (two) times daily.    [provider]  metoprolol succinate (TOPROL-XL) 25 MG 24 hr tablet Take 25 mg by mouth 2 (two) times daily.    [provider]  Multiple Vitamin (MULTIVITAMIN WITH MINERALS) TABS tablet Take 1 tablet by mouth daily. 07/16/21  Lynn Ito, MD  Nutritional Supplements (FEEDING SUPPLEMENT, NEPRO CARB STEADY,) LIQD Take 237 mLs by mouth 2 (two) times daily between meals. 07/15/21   Lynn Ito, MD  Nystatin (GERHARDT'S BUTT CREAM) CREA Apply 1 application topically 2 (two) times daily. 07/15/21   Lynn Ito, MD  pantoprazole (PROTONIX) 40 MG tablet Take 1 tablet (40 mg total) by mouth at bedtime. 07/15/21   Lynn Ito, MD  polyethylene glycol (MIRALAX / GLYCOLAX) 17 g packet Take 17 g by mouth  daily as needed for moderate constipation. 07/15/21   Lynn Ito, MD  tamsulosin (FLOMAX) 0.4 MG CAPS capsule Take 1 capsule (0.4 mg total) by mouth daily. 12/03/15   Vanna Scotland, MD  vitamin C (ASCORBIC ACID) 500 MG tablet Take 500 mg by mouth daily.    [provider]  vitamin E 400 UNIT capsule Take 400 Units by mouth daily.    [provider]  warfarin (COUMADIN) 2.5 MG tablet Take 1 tablet (2.5 mg total) by mouth one time only at 4 PM. 07/15/21   Lynn Ito, MD    Allergies Darvon [propoxyphene], Glipizide, and Saxagliptin  Family History  Problem Relation Age of Onset   Prostate cancer Neg Hx    Bladder Cancer Neg Hx    Kidney cancer Neg Hx    Nephrolithiasis Brother     Social History Social History   Tobacco Use   Smoking status: Never   Smokeless tobacco: Never  Substance Use Topics   Alcohol use: No    Alcohol/week: 0.0 standard drinks   Drug use: No    Review of Systems  Constitutional: No fever/chills Eyes: No visual changes. ENT: No sore throat. Cardiovascular: Denies chest pain. Respiratory:  shortness of breath. Gastrointestinal: No abdominal pain.  No nausea, no vomiting.  No diarrhea.  No constipation. Genitourinary: Negative for dysuria. Musculoskeletal: Negative for back pain. Skin: Negative for rash. Neurological: Negative for headaches, focal weakness   ____________________________________________   PHYSICAL EXAM:  VITAL SIGNS: ED Triage Vitals  Enc Vitals Group     BP 08/09/2021 1223 117/74     Pulse Rate 07/14/2021 1223 (!) 115     Resp 08/05/2021 1223 (!) 35     Temp 07/13/2021 1223 97.9 F (36.6 C)     Temp Source 08/01/2021 1223 Axillary     SpO2 08/09/2021 1221 96 %     Weight --      Height 07/23/2021 1225 6\' 2"  (1.88 m)     Head Circumference --      Peak Flow --      Pain Score 08/08/2021 1224 0     Pain Loc --      Pain Edu? --      Excl. in GC? --     Constitutional: Alert and oriented.  Appears chronically ill  and short of breath. Eyes: Conjunctivae are normal.  Head: Atraumatic. Nose: No congestion/rhinnorhea. Mouth/Throat: Mucous membranes are moist.  Oropharynx non-erythematous. Neck: No stridor.   Cardiovascular: Normal rate, regular rhythm. Grossly normal heart sounds.  Good peripheral circulation. Respiratory: Increased respiratory effort.  retractions. Lungs crackles in bases Gastrointestinal: Soft and nontender. No distention. No abdominal bruits.  Musculoskeletal: No lower extremity tenderness there is at least 1+ edema in both arms and both legs Neurologic:  Normal speech and language.  No apparent new focal neurological changes Skin:  Skin is warm, dry and intact. No rash noted.   ____________________________________________   LABS (all labs ordered are listed, but only abnormal results  are displayed)  Labs Reviewed  CBC WITH DIFFERENTIAL/PLATELET - Abnormal; Notable for the following components:      Result Value   WBC 20.6 (*)    MCV 102.2 (*)    Neutro Abs 18.5 (*)    Abs Immature Granulocytes 0.16 (*)    All other components within normal limits  BLOOD GAS, VENOUS - Abnormal; Notable for the following components:   Bicarbonate 31.5 (*)    Acid-Base Excess 4.2 (*)    All other components within normal limits  LACTIC ACID, PLASMA  LACTIC ACID, PLASMA  URINALYSIS, COMPLETE (UACMP) WITH MICROSCOPIC  PROTIME-INR  BRAIN NATRIURETIC PEPTIDE  COMPREHENSIVE METABOLIC PANEL  D-DIMER, QUANTITATIVE (NOT AT [facility])  PROCALCITONIN  TROPONIN I (HIGH SENSITIVITY)   ____________________________________________  EKG  EKG read interpreted by me shows a tachycardic rhythm that appears to be partly sinus and partly premature junctional beats.  He has right bundle branch and left anterior hemiblock.  I do not see any acute ST-T wave changes.  This rhythm is new from his previous EKG. ____________________________________________  RADIOLOGY Jill Poling, personally viewed and  evaluated these images (plain radiographs) as part of my medical decision making, as well as reviewing the written report by the radiologist.  ED MD interpretation: Chest x-ray looks like congestive heart failure with increased vascular markings.  Bedside ultrasound showed good heart movement with no pericardial effusion and signs of fluid in the lungs on ultrasound.  Official radiology report(s): DG Chest Portable 1 View  Result Date: 08/17/2021 CLINICAL DATA:  Dyspnea, hypoxia EXAM: PORTABLE CHEST 1 VIEW COMPARISON:  07/12/2021 FINDINGS: Lung volumes are small. Small right basilar pleural effusion again noted. Superimposed collapse and consolidation of the right lung base. Left basilar atelectasis or infiltrate again noted. Tiny left pleural effusion suspected. No pneumothorax. Cardiac size within normal limits. Pulmonary vascularity is normal. IMPRESSION: Progressive pulmonary hypoinflation. Stable small right pleural effusion with progressive right basilar collapse and consolidation. Stable left basilar atelectasis or infiltrate. Electronically Signed   By: Helyn Numbers MD   On: 08/17/21 12:46    ____________________________________________   PROCEDURES  Procedure(s) performed (including Critical Care):  Procedures   ____________________________________________   INITIAL IMPRESSION / ASSESSMENT AND PLAN / ED COURSE  Discussed at length with patient and brother.  Patient is given brother power of attorney for healthcare matters.  Patient is okay with brother's decisions.  Brother has decided for comfort care only.  We will stop antibiotics and further blood work and maintain him on the oxygen mask nonrebreather only.  Titrate that down as possible.             ____________________________________________   FINAL CLINICAL IMPRESSION(S) / ED DIAGNOSES  Final diagnoses:  Hypoxia  Sepsis after obstetrical procedure     ED Discharge Orders     None        Note:   This document was prepared using Dragon voice recognition software and may include unintentional dictation errors.    Arnaldo Natal, MD Aug 17, 2021 1331    Arnaldo Natal, MD 08-17-21 1355

## 2021-07-19 ENCOUNTER — Encounter: Payer: Self-pay | Admitting: Family Medicine

## 2021-07-19 ENCOUNTER — Other Ambulatory Visit: Payer: Self-pay

## 2021-07-19 LAB — AEROBIC/ANAEROBIC CULTURE W GRAM STAIN (SURGICAL/DEEP WOUND): Special Requests: NORMAL

## 2021-08-12 NOTE — Death Summary Note (Addendum)
DEATH SUMMARY   Patient Details  Name: Gabriel [name]. MRN: [id number] DOB: 1932-12-11  Admission/Discharge Information   Admit Date:  08/01/21  Date of Death:  2021-08-02  Time of Death:  10:28  Length of Stay: 1  Referring Physician: Dione Housekeeper, MD   Reason(s) for Hospitalization  Acute respiratory failure secondary to pneumonia.  Diagnoses  Preliminary cause of death:  Secondary Diagnoses (including complications and co-morbidities):  Active Problems:   Comfort measures only status   Brief Hospital Course (including significant findings, care, treatment, and services provided and events leading to death)  Gabriel [name]. is a 87 y.o. male with a past medical history of hypertension, history of CVA, non-insulin-dependent diabetes mellitus type 2, hyperlipidemia, history of nephrolithiasis.  He came to the hospital from nursing home with severe hypoxemia with oxygen saturation in the 70s, was placed on 15 L nonrebreather.  After discussion with the family, patient is transitioned to comfort care.  Patient expired at 10:28 AM today. Cause of death: Acute hypoxemic respite failure secondary to right lower lobe pneumonia Right lower lobe pneumonia. History of CVA. Type 2 diabetes. Sepsis secondary to pneumonia.  Pertinent Labs and Studies  Significant Diagnostic Studies DG Abdomen 1 View  Result Date: 07/05/2021 CLINICAL DATA:  87 year old male status post NG placement EXAM: ABDOMEN - 1 VIEW COMPARISON:  Chest radiograph dated 07/05/2021. FINDINGS: The tip of the enteric tube is at the level of the GE junction. Recommend further advancing by additional 13 cm. IMPRESSION: Enteric tube at the GE junction. Recommend further advancing by additional 13 cm. Electronically Signed   By: Elgie Collard M.D.   On: 07/05/2021 20:43   CT Head Wo Contrast  Result Date: 07/05/2021 CLINICAL DATA:  Fall, found unresponsive EXAM: CT HEAD WITHOUT CONTRAST CT CERVICAL SPINE  WITHOUT CONTRAST TECHNIQUE: Multidetector CT imaging of the head and cervical spine was performed following the standard protocol without intravenous contrast. Multiplanar CT image reconstructions of the cervical spine were also generated. COMPARISON:  09/23/2008 FINDINGS: CT HEAD FINDINGS Brain: No evidence of acute infarction, hemorrhage, hydrocephalus, extra-axial collection or mass lesion/mass effect. Mild periventricular and deep white matter hypodensity. Small foci of bilateral occipital encephalomalacia unchanged (series 4, image 16) mild global cerebral volume loss. Vascular: No hyperdense vessel or unexpected calcification. Skull: Normal. Negative for fracture or focal lesion. Sinuses/Orbits: No acute finding. Other: None. CT CERVICAL SPINE FINDINGS Alignment: Normal. Skull base and vertebrae: No acute fracture. No primary bone lesion or focal pathologic process. Soft tissues and spinal canal: No prevertebral fluid or swelling. No visible canal hematoma. Disc levels: Moderate multilevel disc space height loss and osteophytosis. Upper chest: Negative. Other: Esophagogastric tube is coiled in the oropharynx. IMPRESSION: 1. No acute intracranial pathology. 2. Small-vessel white matter disease and small foci of bilateral occipital encephalomalacia, unchanged from prior. 3. No fracture or static subluxation of the cervical spine. 4. Esophagogastric tube is coiled in the oropharynx. Correlate with chest/abdominal radiograph for appropriate positioning of tip and side port below the diaphragm. Electronically Signed   By: Lauralyn Primes M.D.   On: 07/05/2021 21:06   CT Cervical Spine Wo Contrast  Result Date: 07/05/2021 CLINICAL DATA:  Fall, found unresponsive EXAM: CT HEAD WITHOUT CONTRAST CT CERVICAL SPINE WITHOUT CONTRAST TECHNIQUE: Multidetector CT imaging of the head and cervical spine was performed following the standard protocol without intravenous contrast. Multiplanar CT image reconstructions of the  cervical spine were also generated. COMPARISON:  09/23/2008 FINDINGS: CT HEAD FINDINGS Brain:  No evidence of acute infarction, hemorrhage, hydrocephalus, extra-axial collection or mass lesion/mass effect. Mild periventricular and deep white matter hypodensity. Small foci of bilateral occipital encephalomalacia unchanged (series 4, image 16) mild global cerebral volume loss. Vascular: No hyperdense vessel or unexpected calcification. Skull: Normal. Negative for fracture or focal lesion. Sinuses/Orbits: No acute finding. Other: None. CT CERVICAL SPINE FINDINGS Alignment: Normal. Skull base and vertebrae: No acute fracture. No primary bone lesion or focal pathologic process. Soft tissues and spinal canal: No prevertebral fluid or swelling. No visible canal hematoma. Disc levels: Moderate multilevel disc space height loss and osteophytosis. Upper chest: Negative. Other: Esophagogastric tube is coiled in the oropharynx. IMPRESSION: 1. No acute intracranial pathology. 2. Small-vessel white matter disease and small foci of bilateral occipital encephalomalacia, unchanged from prior. 3. No fracture or static subluxation of the cervical spine. 4. Esophagogastric tube is coiled in the oropharynx. Correlate with chest/abdominal radiograph for appropriate positioning of tip and side port below the diaphragm. Electronically Signed   By: Lauralyn Primes M.D.   On: 07/05/2021 21:06   US RENAL  Result Date: 07/06/2021 CLINICAL DATA:  Acute kidney injury. EXAM: RENAL / URINARY TRACT ULTRASOUND COMPLETE COMPARISON:  CT abdomen 10/27/2015. FINDINGS: Right Kidney: Renal measurements: 13.0 x 6.8 x 5.8 cm = volume: 269 mL. Normal echogenicity. Negative for hydronephrosis. Renal cysts. Evidence for adjacent cysts in the posterior right kidney, largest measuring up to 3.9 cm. One cyst has a few septations. There is an additional simple anechoic cyst in the right kidney measuring up to 3.5 cm. Left Kidney: Renal measurements: 13.2 x 7.3 x  6.2 = volume: 315 mL. Normal echogenicity. Negative for hydronephrosis. Overall, limited evaluation. Anechoic cyst in the superior left kidney that measures up to 2.7 cm. Bladder: 1.2 cm calcification with posterior acoustic shadowing in the bladder. Other: None. IMPRESSION: 1. Negative for hydronephrosis. 2. Large calcification in the bladder measuring up to 1.2 cm. 3. Limited evaluation of the left kidney. 4. Bilateral renal cysts. At least one right renal cyst is complex with septations. Electronically Signed   By: Richarda Overlie M.D.   On: 07/06/2021 09:08   DG Chest Portable 1 View  Result Date: 08-08-21 CLINICAL DATA:  Dyspnea, hypoxia EXAM: PORTABLE CHEST 1 VIEW COMPARISON:  07/12/2021 FINDINGS: Lung volumes are small. Small right basilar pleural effusion again noted. Superimposed collapse and consolidation of the right lung base. Left basilar atelectasis or infiltrate again noted. Tiny left pleural effusion suspected. No pneumothorax. Cardiac size within normal limits. Pulmonary vascularity is normal. IMPRESSION: Progressive pulmonary hypoinflation. Stable small right pleural effusion with progressive right basilar collapse and consolidation. Stable left basilar atelectasis or infiltrate. Electronically Signed   By: Helyn Numbers MD   On: August 08, 2021 12:46   DG Chest Port 1 View  Result Date: 07/12/2021 CLINICAL DATA:  Shortness of breath EXAM: PORTABLE CHEST 1 VIEW COMPARISON:  07/06/2021 FINDINGS: Interval removal of NG tube and endotracheal tube. Bilateral lower lobe airspace opacities, right greater than left, worsening since prior study. Heart is normal size. Small bilateral effusions. No pneumothorax. IMPRESSION: Worsening bilateral lower lobe airspace disease and effusions, right greater than left. Electronically Signed   By: Charlett Nose M.D.   On: 07/12/2021 11:17   DG Chest Port 1 View  Result Date: 07/06/2021 CLINICAL DATA:  87 year old male intubated. Respiratory failure, acute kidney  injury. EXAM: PORTABLE CHEST 1 VIEW COMPARISON:  Portable chest 07/05/2021 and earlier. FINDINGS: Portable AP upright view at 0634 hours. Endotracheal tube tip in good  position between the level the clavicles and carina. Enteric tube tip is at the lower thoracic esophagus, side hole is at the level of the carina. The patient is mildly rotated to the left now. Stable cardiac size and mediastinal contours. Right lung ventilation appears stable but there is new patchy left lung base opacity. No pneumothorax or pulmonary edema. Stable visualized osseous structures. IMPRESSION: 1. Endotracheal tube tip in good position. But enteric tube side hole at the level of the carina in the mid esophagus. Advance to ensure side hole placement inside the stomach. 2. Patchy increased left lung base opacity is nonspecific and might reflect atelectasis or infection. Electronically Signed   By: Odessa Fleming M.D.   On: 07/06/2021 06:50   DG Chest Portable 1 View  Result Date: 07/05/2021 CLINICAL DATA:  Status post intubation. EXAM: PORTABLE CHEST 1 VIEW COMPARISON:  July 05, 2021 (8:27 p.m.) FINDINGS: An endotracheal tube is seen with its distal tip approximately 8.0 cm from the carina. A nasogastric tube is noted with its distal tip overlying the expected region of the gastroesophageal junction. Mild atelectasis is seen within the left lung base. There is no evidence of a pleural effusion or pneumothorax. The heart size and mediastinal contours are within normal limits. Radiopaque surgical clips are seen overlying the right upper quadrant. The visualized skeletal structures are unremarkable. IMPRESSION: Interval nasogastric tube positioning, as described above. Further advancement of the NG tube by approximately 7 cm is recommended to decrease the risk of aspiration. Electronically Signed   By: Aram Candela M.D.   On: 07/05/2021 21:26   DG Chest Portable 1 View  Result Date: 07/05/2021 CLINICAL DATA:  87 year old male status  post intubation. EXAM: PORTABLE CHEST 1 VIEW COMPARISON:  Chest radiograph dated 09/23/2008. FINDINGS: Endotracheal tube with tip approximately 2.5 cm above the carina. Recommend retraction by 3 cm for optimal positioning. Enteric tube with side-port in the distal esophagus and tip in the region of the GE junction. Recommend further advancing by additional 13 cm. Probable small left pleural effusion and left lung base atelectasis versus infiltrate. No pneumothorax. The cardiac silhouette is within limits. Bilateral hilar prominence may represent pulmonary hypertension. No acute osseous pathology. IMPRESSION: 1. Endotracheal tube above the carina. Recommend retraction by 3 cm for optimal positioning. 2. Enteric tube with tip in the region of the GE junction. Recommend further advancing by additional 13 cm. Electronically Signed   By: Elgie Collard M.D.   On: 07/05/2021 20:42   ECHOCARDIOGRAM COMPLETE  Result Date: 07/06/2021    ECHOCARDIOGRAM REPORT   Patient Name:   Bria Portales. Date of Exam: 07/06/2021 Medical Rec #:  [id number]          Height:       74.0 in Accession #:    [id number]         Weight:       201.9 lb Date of Birth:  09-15-1932          BSA:          2.182 m Patient Age:    89 years           BP:           127/54 mmHg Patient Gender: M                  HR:           99 bpm. Exam Location:  [facility] Procedure: 2D Echo, Cardiac Doppler and Color Doppler Indications:  Cardiomyopathy--unspecified I42.9  History:         Patient has no prior history of Echocardiogram examinations.                  CHF; Risk Factors:Hypertension and Diabetes.  Sonographer:     Cristela BlueJerry Hege RDCS (AE) Referring Phys:  [id number] BRITTON L R[place] Diagnosing Phys: Alwyn Peawayne D Callwood MD  Sonographer Comments: Technically difficult study due to poor echo windows, echo performed with patient supine and on artificial respirator, no apical window and suboptimal parasternal window. IMPRESSIONS  1. Left ventricular ejection  fraction, by estimation, is 65 to 70%. The left ventricle has normal function. The left ventricle has no regional wall motion abnormalities. There is moderate asymmetric left ventricular hypertrophy of the septal segment. Left ventricular diastolic function could not be evaluated.  2. Right ventricular systolic function is low normal. The right ventricular size is mildly enlarged. Mildly increased right ventricular wall thickness.  3. The mitral valve is normal in structure. No evidence of mitral valve regurgitation.  4. The aortic valve is normal in structure. Aortic valve regurgitation is not visualized. No aortic stenosis is present. Conclusion(s)/Recommendation(s): Poor windows for evaluation of left ventricular function by transthoracic echocardiography. Would recommend an alternative means of evaluation. FINDINGS  Left Ventricle: Left ventricular ejection fraction, by estimation, is 65 to 70%. The left ventricle has normal function. The left ventricle has no regional wall motion abnormalities. The left ventricular internal cavity size was normal in size. There is  moderate asymmetric left ventricular hypertrophy of the septal segment. Left ventricular diastolic function could not be evaluated. Right Ventricle: The right ventricular size is mildly enlarged. Mildly increased right ventricular wall thickness. Right ventricular systolic function is low normal. Left Atrium: Left atrial size was normal in size. Right Atrium: Right atrial size was normal in size. Pericardium: There is no evidence of pericardial effusion. Mitral Valve: The mitral valve is normal in structure. No evidence of mitral valve regurgitation. Tricuspid Valve: The tricuspid valve is normal in structure. Tricuspid valve regurgitation is trivial. Aortic Valve: The aortic valve is normal in structure. Aortic valve regurgitation is not visualized. No aortic stenosis is present. Pulmonic Valve: The pulmonic valve was grossly normal. Pulmonic valve  regurgitation is not visualized. Aorta: The ascending aorta was not well visualized. IAS/Shunts: No atrial level shunt detected by color flow Doppler.  LEFT VENTRICLE PLAX 2D LVIDd:         3.54 cm LVIDs:         2.25 cm LV PW:         1.17 cm LV IVS:        1.72 cm  LEFT ATRIUM         Index LA diam:    3.10 cm 1.42 cm/m                        PULMONIC VALVE AORTA                 PV Vmax:        1.01 m/s Ao Root diam: 3.10 cm PV Peak grad:   4.1 mmHg                       RVOT Peak grad: 8 mmHg  TRICUSPID VALVE TR Peak grad:   14.3 mmHg TR Vmax:        189.00 cm/s Alwyn Peawayne D Callwood MD Electronically signed by Alwyn Peawayne D Callwood MD  Signature Date/Time: 07/06/2021/1:36:28 PM    Final    [place] EKG SITE RITE  Result Date: 07/13/2021 If Site Rite image not attached, placement could not be confirmed due to current cardiac rhythm.   Microbiology Recent Results (from the past 240 hour(s))  Aerobic/Anaerobic Culture w Gram Stain (surgical/deep wound)     Status: None (Preliminary result)   Collection Time: 07/16/21  9:06 PM   Specimen: Ulcer; Wound  Result Value Ref Range Status   Specimen Description   Final    ULCER Performed at [facility], [address] [place], [place] [address]    Special Requests   Final    Normal Performed at [facility], [address] [place], [place] [address]    Gram Stain   Final    RARE WBC PRESENT,BOTH PMN AND MONONUCLEAR RARE GRAM POSITIVE COCCI IN PAIRS FEW GRAM NEGATIVE RODS Performed at [facility], [address], [place], [place] [address]    Culture   Final    CULTURE REINCUBATED FOR BETTER GROWTH NO ANAEROBES ISOLATED; CULTURE IN PROGRESS FOR 5 DAYS    Report Status PENDING  Incomplete  Resp Panel by RT-PCR (Flu A&B, Covid) Nasopharyngeal Swab     Status: None   Collection Time: 2021-07-20 12:19 PM   Specimen: Nasopharyngeal Swab; Nasopharyngeal(NP) swabs in vial transport medium  Result Value Ref Range Status   SARS  Coronavirus 2 by RT PCR NEGATIVE NEGATIVE Final    Comment: (NOTE) SARS-CoV-2 target nucleic acids are NOT DETECTED.  The SARS-CoV-2 RNA is generally detectable in upper respiratory specimens during the acute phase of infection. The lowest concentration of SARS-CoV-2 viral copies this assay can detect is 138 copies/mL. A negative result does not preclude SARS-Cov-2 infection and should not be used as the sole basis for treatment or other patient management decisions. A negative result may occur with  improper specimen collection/handling, submission of specimen other than nasopharyngeal swab, presence of viral mutation(s) within the areas targeted by this assay, and inadequate number of viral copies(<138 copies/mL). A negative result must be combined with clinical observations, patient history, and epidemiological information. The expected result is Negative.  Fact Sheet for Patients:  BloggerCourse.com  Fact Sheet for Healthcare Providers:  SeriousBroker.it  This test is no t yet approved or cleared by the [place] FDA and  has been authorized for detection and/or diagnosis of SARS-CoV-2 by FDA under an Emergency Use Authorization (EUA). This EUA will remain  in effect (meaning this test can be used) for the duration of the COVID-19 declaration under Section 564(b)(1) of the Act, 21 U.S.C.section 360bbb-3(b)(1), unless the authorization is terminated  or revoked sooner.       Influenza A by PCR NEGATIVE NEGATIVE Final   Influenza B by PCR NEGATIVE NEGATIVE Final    Comment: (NOTE) The Xpert Xpress SARS-CoV-2/FLU/RSV plus assay is intended as an aid in the diagnosis of influenza from Nasopharyngeal swab specimens and should not be used as a sole basis for treatment. Nasal washings and aspirates are unacceptable for Xpert Xpress SARS-CoV-2/FLU/RSV testing.  Fact Sheet for  Patients: BloggerCourse.com  Fact Sheet for Healthcare Providers: SeriousBroker.it  This test is not yet approved or cleared by the [place] FDA and has been authorized for detection and/or diagnosis of SARS-CoV-2 by FDA under an Emergency Use Authorization (EUA). This EUA will remain in effect (meaning this test can be used) for the duration of the COVID-19 declaration under Section 564(b)(1) of the Act, 21  U.S.C. section 360bbb-3(b)(1), unless the authorization is terminated or revoked.  Performed at [facility], [address] [place], [place] [address]     Lab Basic Metabolic Panel: Recent Labs  Lab 07/12/21 1630 07/13/21 1109 07/14/21 0536 07/15/21 0550  NA 147* 145 142 144  K 3.4* 3.5 3.0* 3.2*  CL 107 106 102 103  CO2 34* 32 35* 35*  GLUCOSE 201* 190* 138* 151*  BUN 27* 27* 27* 31*  CREATININE 0.87 0.78 0.74 0.77  CALCIUM 9.0 9.1 8.9 8.9  MG 2.2 2.0 2.1 2.0  PHOS 3.0 3.3 3.5 2.5   Liver Function Tests: No results for input(s): AST, ALT, ALKPHOS, BILITOT, PROT, ALBUMIN in the last 168 hours. No results for input(s): LIPASE, AMYLASE in the last 168 hours. No results for input(s): AMMONIA in the last 168 hours. CBC: Recent Labs  Lab 07/12/21 1630 07/13/21 1109 07/14/21 0536 07/15/21 0550 07/31/2021 1219  WBC 15.0* 17.5* 15.9* 12.6* 20.6*  NEUTROABS 12.9*  --   --   --  18.5*  HGB 15.2 16.3 15.2 14.2 15.6  HCT 45.9 48.2 44.8 42.6 47.3  MCV 101.3* 101.9* 101.4* 99.3 102.2*  PLT 213 205 221 232 267   Cardiac Enzymes: No results for input(s): CKTOTAL, CKMB, CKMBINDEX, TROPONINI in the last 168 hours. Sepsis Labs: Recent Labs  Lab 07/13/21 1109 07/14/21 0536 07/15/21 0550 07-31-21 1219 2021-07-31 1242  WBC 17.5* 15.9* 12.6* 20.6*  --   LATICACIDVEN  --   --   --   --  1.8    Procedures/Operations  None   Jaquil Todt 08/09/2021, 10:43 AM

## 2021-08-12 NOTE — Progress Notes (Addendum)
Comfort care measures implemented in ED; pt arrived to unit unresponsive and on morphine drip.  Pt has a red MEWS due to LOC.  On call provider, Dr. Para March notified.  This nurse requested that an order be placed for comfort care; Dr. Para March replied that day shift attending will place order at 0700.  Sports coach are aware.  Will continue to monitor and assess and update as needed.

## 2021-08-12 NOTE — Progress Notes (Signed)
Pt expired at 10:28. Attending MD Notified. Post-mortem flow sheet completed.  Family at bedside. Post mortem care provided.  [facility] contacted.

## 2021-08-12 DEATH — deceased

## 2023-05-22 IMAGING — CT CT HEAD W/O CM
3 of 4 series · 14 of 47 positions shown, 16 images · non-contrast
Comparison: 09/23/2008

CLINICAL DATA: Fall, found unresponsive

EXAM:
CT HEAD WITHOUT CONTRAST
CT CERVICAL SPINE WITHOUT CONTRAST
TECHNIQUE: Multidetector CT imaging of the head and cervical spine was
performed following the standard protocol without intravenous
contrast. Multiplanar CT image reconstructions of the cervical spine
were also generated.

[Series 2: head wo · axial · 0.46mm/px · z∈[-178,-33]mm · 8 of 35 slices shown, 10 images]
[im 3/35  brain]
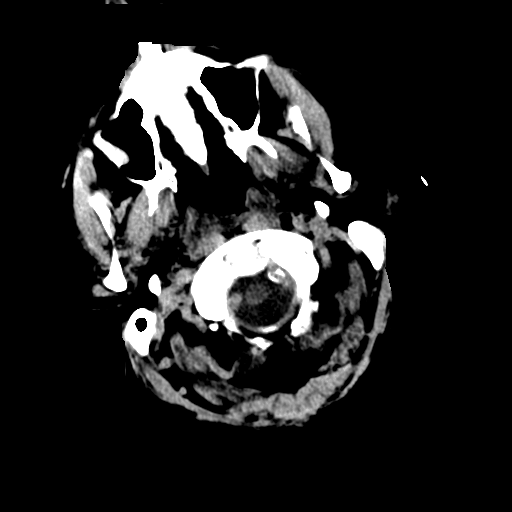
[im 3/35  bone]
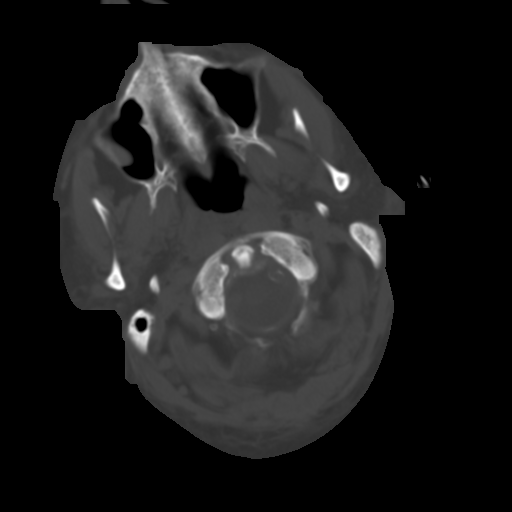
[im 7/35  brain]
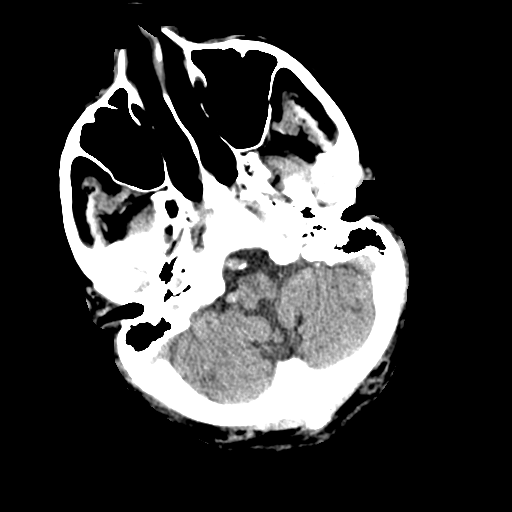
[im 12/35  brain]
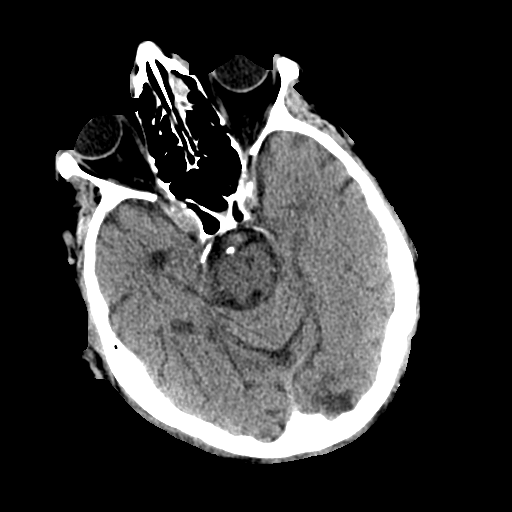
[im 16/35  brain]
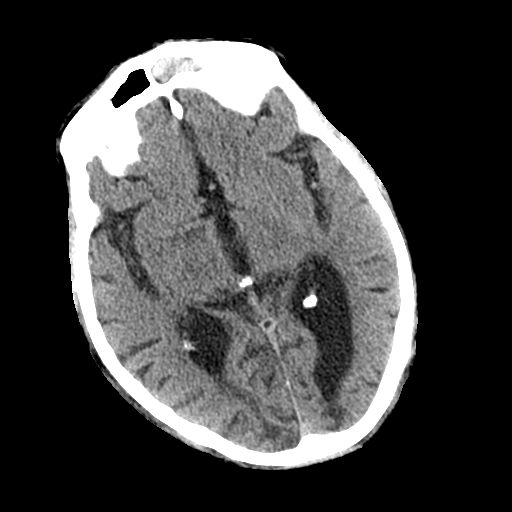
[im 19/35  brain]
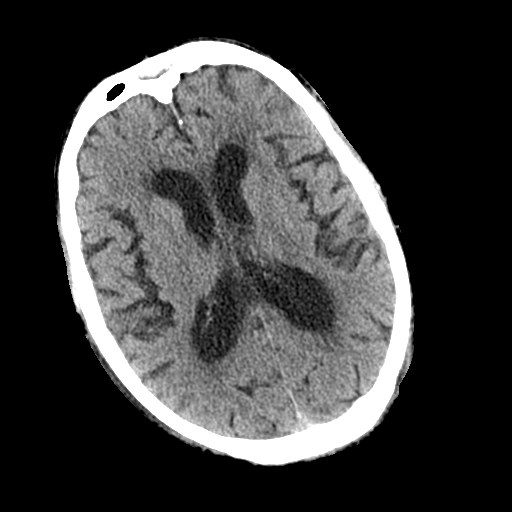
[im 19/35  bone]
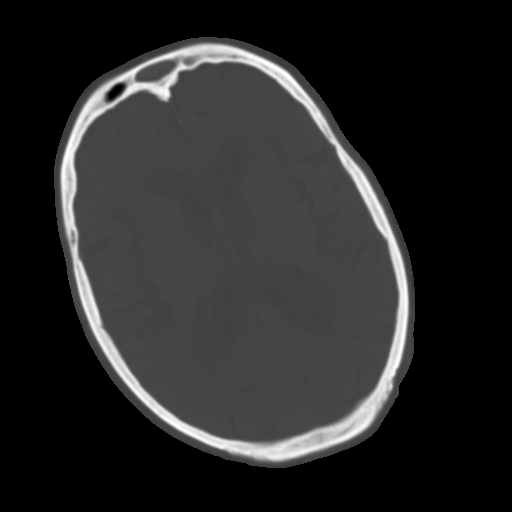
[im 23/35  brain]
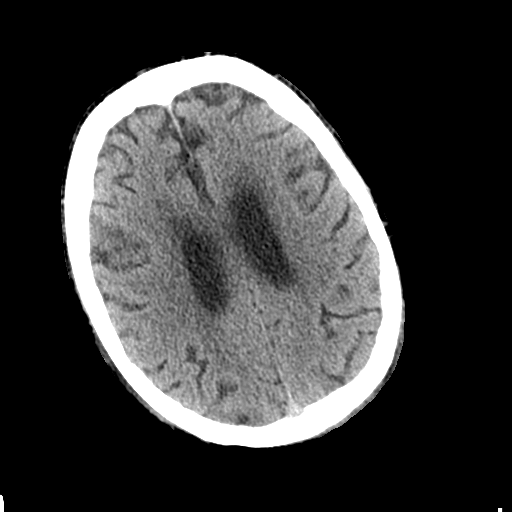
[im 28/35  brain]
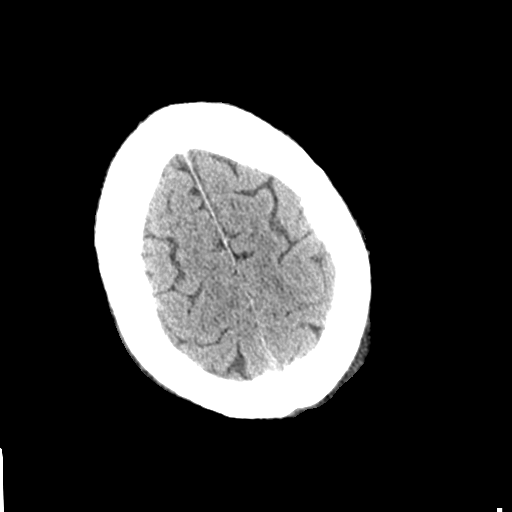
[im 32/35  brain]
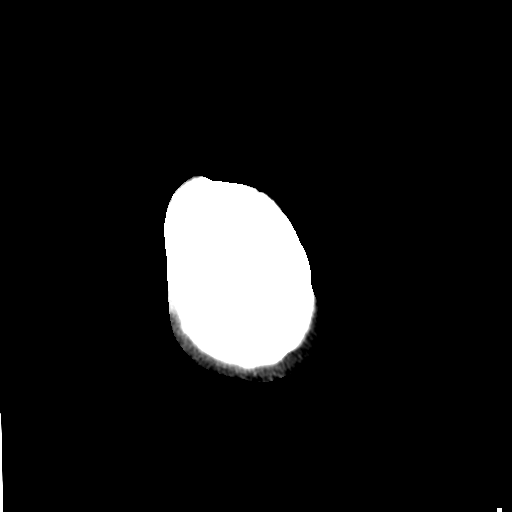

[Series 5: sagittal soft tissue · sagittal · 0.37mm/px · 3 of 56 slices shown]
[im 19/56  brain]
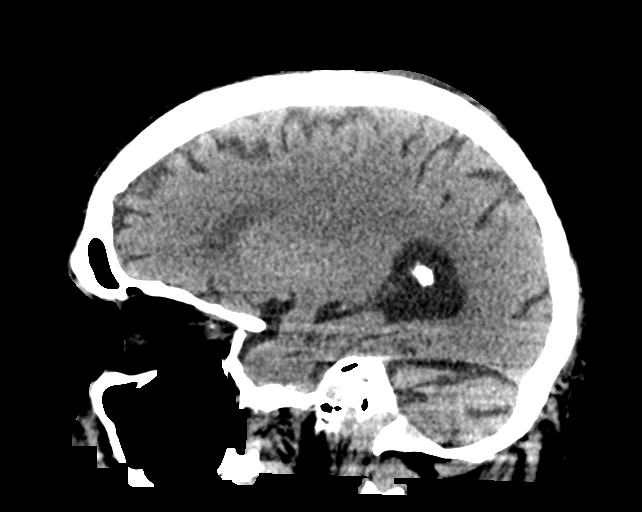
[im 28/56  brain]
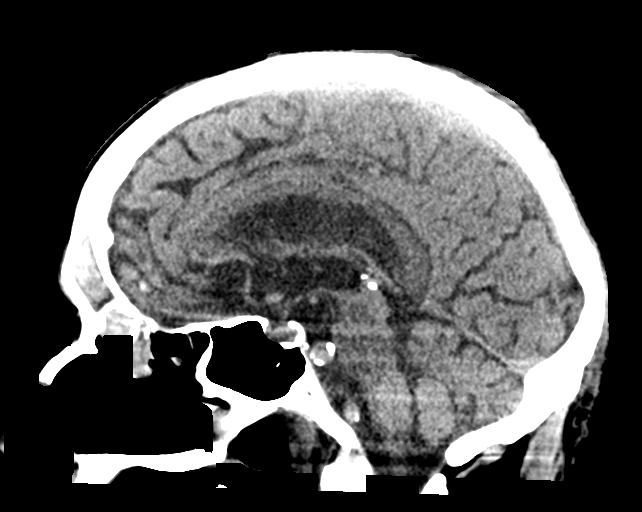
[im 37/56  brain]
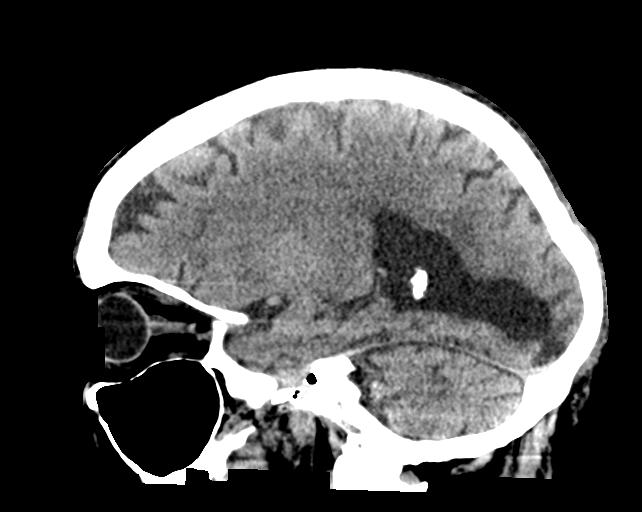

[Series 6: coronal soft tissue · coronal · 0.34mm/px · 3 of 70 slices shown]
[im 24/70  brain]
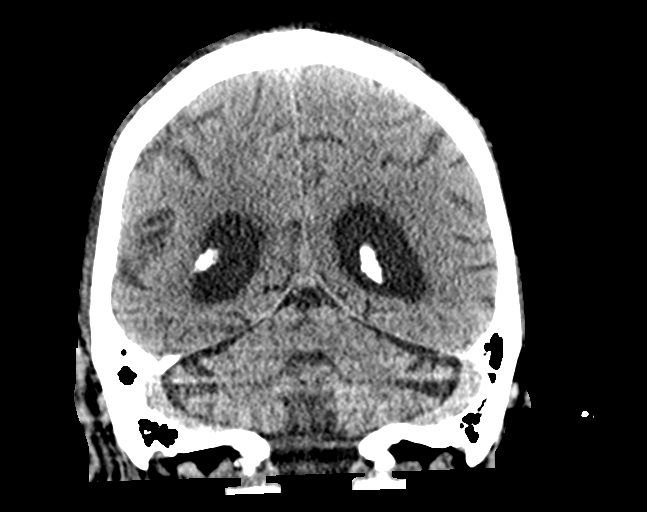
[im 31/70  brain]
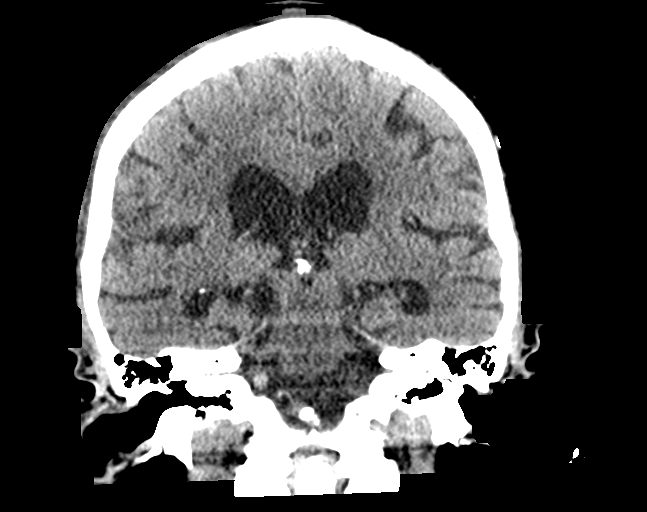
[im 39/70  brain]
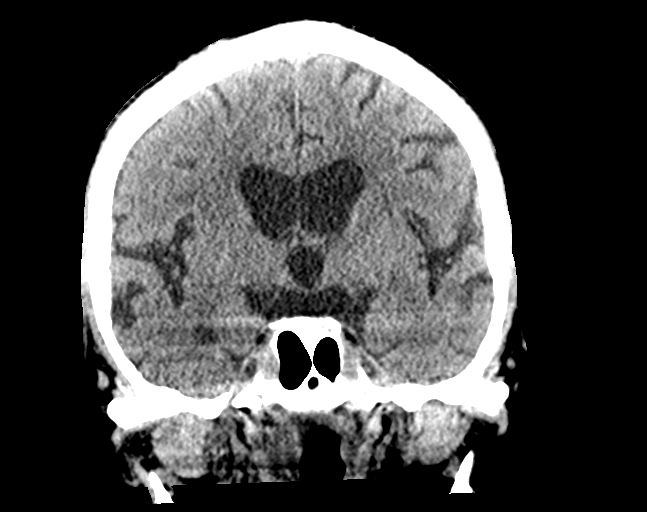

[14 of 47 positions shown; findings below may reference images not displayed]

FINDINGS: CT HEAD FINDINGS

Brain: No evidence of acute infarction, hemorrhage, hydrocephalus,
extra-axial collection or mass lesion/mass effect. Mild
periventricular and deep white matter hypodensity. Small foci of
bilateral occipital encephalomalacia unchanged (series 4, image 16)
mild global cerebral volume loss.

Vascular: No hyperdense vessel or unexpected calcification.

Skull: Normal. Negative for fracture or focal lesion.

Sinuses/Orbits: No acute finding.

Other: None.

CT CERVICAL SPINE FINDINGS

Alignment: Normal.

Skull base and vertebrae: No acute fracture. No primary bone lesion
or focal pathologic process.

Soft tissues and spinal canal: No prevertebral fluid or swelling. No
visible canal hematoma.

Disc levels: Moderate multilevel disc space height loss and
osteophytosis.

Upper chest: Negative.

Other: Esophagogastric tube is coiled in the oropharynx.
IMPRESSION: 1. No acute intracranial pathology.
2. Small-vessel white matter disease and small foci of bilateral
occipital encephalomalacia, unchanged from prior.
3. No fracture or static subluxation of the cervical spine.
4. Esophagogastric tube is coiled in the oropharynx. Correlate with
chest/abdominal radiograph for appropriate positioning of tip and
side port below the diaphragm.

## 2023-05-29 IMAGING — DX DG CHEST 1V PORT
1 series · 1 of 1 positions shown · non-contrast
Comparison: 07/06/2021

CLINICAL DATA: Shortness of breath

EXAM:
PORTABLE CHEST 1 VIEW

[chest ap]
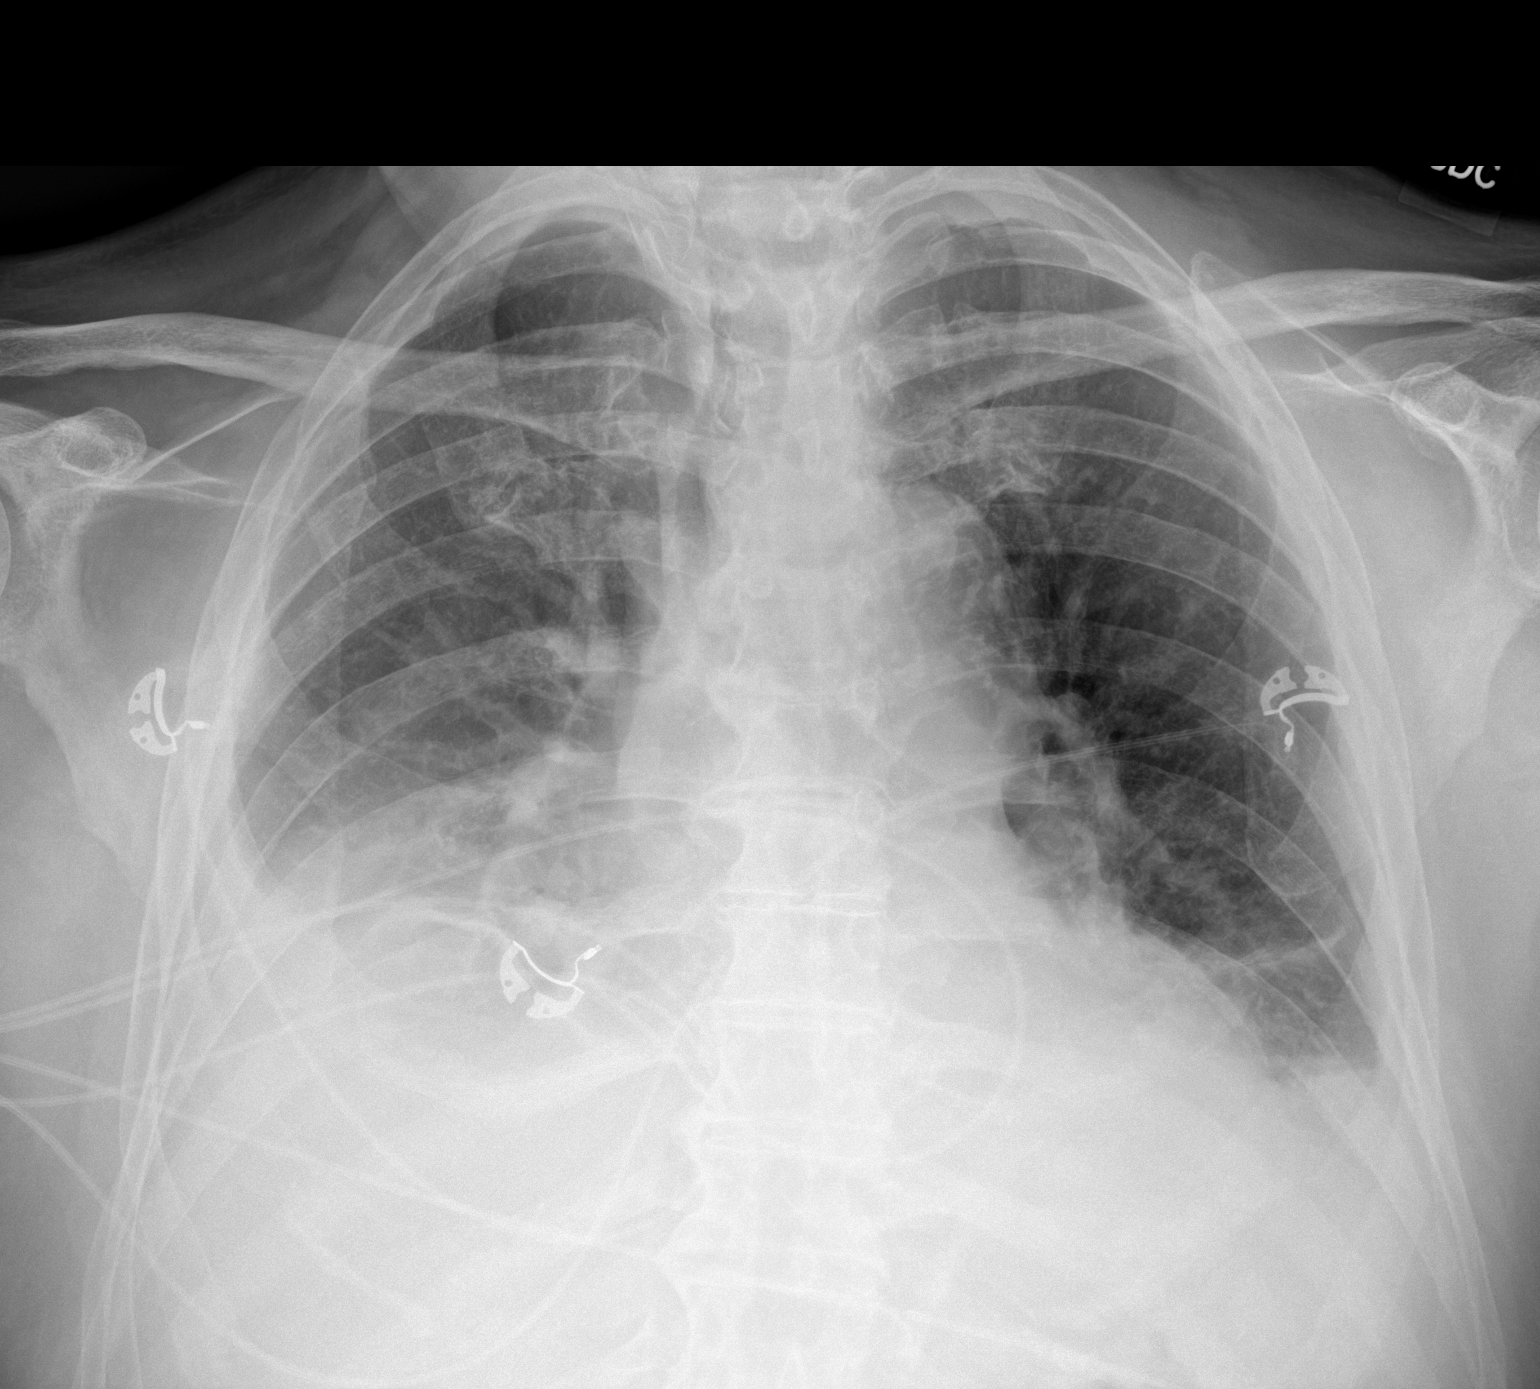

[1 of 1 positions shown; findings below may reference images not displayed]

FINDINGS: Interval removal of NG tube and endotracheal tube. Bilateral lower
lobe airspace opacities, right greater than left, worsening since
prior study. Heart is normal size. Small bilateral effusions. No
pneumothorax.
IMPRESSION: Worsening bilateral lower lobe airspace disease and effusions, right
greater than left.
